# Patient Record
Sex: Female | Born: 1965
Health system: Southern US, Community
[De-identification: ages and names within clinical notes are randomized; demographics above are authoritative.]

## PROBLEM LIST (undated history)

## (undated) DIAGNOSIS — R1013 Epigastric pain: Secondary | ICD-10-CM

## (undated) DIAGNOSIS — K219 Gastro-esophageal reflux disease without esophagitis: Secondary | ICD-10-CM

## (undated) DIAGNOSIS — N83209 Unspecified ovarian cyst, unspecified side: Secondary | ICD-10-CM

## (undated) DIAGNOSIS — K644 Residual hemorrhoidal skin tags: Secondary | ICD-10-CM

## (undated) DIAGNOSIS — R51 Headache: Secondary | ICD-10-CM

## (undated) DIAGNOSIS — R911 Solitary pulmonary nodule: Secondary | ICD-10-CM

## (undated) DIAGNOSIS — R5383 Other fatigue: Secondary | ICD-10-CM

## (undated) DIAGNOSIS — G8929 Other chronic pain: Secondary | ICD-10-CM

## (undated) DIAGNOSIS — Z8719 Personal history of other diseases of the digestive system: Secondary | ICD-10-CM

## (undated) DIAGNOSIS — R5381 Other malaise: Secondary | ICD-10-CM

## (undated) DIAGNOSIS — R209 Unspecified disturbances of skin sensation: Secondary | ICD-10-CM

## (undated) DIAGNOSIS — K8020 Calculus of gallbladder without cholecystitis without obstruction: Secondary | ICD-10-CM

## (undated) DIAGNOSIS — I868 Varicose veins of other specified sites: Secondary | ICD-10-CM

## (undated) DIAGNOSIS — K859 Acute pancreatitis without necrosis or infection, unspecified: Secondary | ICD-10-CM

## (undated) DIAGNOSIS — R945 Abnormal results of liver function studies: Secondary | ICD-10-CM

## (undated) DIAGNOSIS — M545 Low back pain: Secondary | ICD-10-CM

## (undated) DIAGNOSIS — M25559 Pain in unspecified hip: Secondary | ICD-10-CM

## (undated) DIAGNOSIS — R519 Headache, unspecified: Secondary | ICD-10-CM

## (undated) HISTORY — PX: CHOLECYSTECTOMY: SHX55

## (undated) HISTORY — DX: Calculus of gallbladder without cholecystitis without obstruction: K80.20

## (undated) HISTORY — DX: Unspecified ovarian cyst, unspecified side: N83.209

## (undated) HISTORY — DX: Headache: R51

## (undated) HISTORY — DX: Varicose veins of other specified sites: I86.8

## (undated) HISTORY — DX: Other chronic pain: G89.29

## (undated) HISTORY — DX: Other malaise: R53.81

## (undated) HISTORY — DX: Unspecified disturbances of skin sensation: R20.9

## (undated) HISTORY — PX: TONSILLECTOMY: SUR1361

## (undated) HISTORY — DX: Headache, unspecified: R51.9

## (undated) HISTORY — DX: Other fatigue: R53.83

## (undated) HISTORY — DX: Pain in unspecified hip: M25.559

## (undated) HISTORY — DX: Solitary pulmonary nodule: R91.1

## (undated) HISTORY — DX: Residual hemorrhoidal skin tags: K64.4

## (undated) HISTORY — DX: Abnormal results of liver function studies: R94.5

## (undated) HISTORY — PX: TONSILLECTOMY: SHX5217

## (undated) HISTORY — DX: Acute pancreatitis without necrosis or infection, unspecified: K85.90

## (undated) HISTORY — DX: Personal history of other diseases of the digestive system: Z87.19

## (undated) HISTORY — DX: Gastro-esophageal reflux disease without esophagitis: K21.9

## (undated) HISTORY — DX: Epigastric pain: R10.13

## (undated) HISTORY — DX: Low back pain: M54.5

## (undated) HISTORY — PX: OTHER SURGICAL HISTORY: SHX169

---

## 2000-03-28 ENCOUNTER — Other Ambulatory Visit: Admission: RE | Admit: 2000-03-28 | Discharge: 2000-03-28 | Payer: Self-pay | Admitting: Obstetrics & Gynecology

## 2001-10-24 ENCOUNTER — Other Ambulatory Visit: Admission: RE | Admit: 2001-10-24 | Discharge: 2001-10-24 | Payer: Self-pay | Admitting: Obstetrics & Gynecology

## 2001-12-06 ENCOUNTER — Encounter (INDEPENDENT_AMBULATORY_CARE_PROVIDER_SITE_OTHER): Payer: Self-pay | Admitting: *Deleted

## 2002-03-15 ENCOUNTER — Ambulatory Visit (HOSPITAL_COMMUNITY): Admission: RE | Admit: 2002-03-15 | Discharge: 2002-03-15 | Payer: Self-pay | Admitting: Family Medicine

## 2002-03-15 ENCOUNTER — Encounter: Payer: Self-pay | Admitting: Family Medicine

## 2002-10-16 ENCOUNTER — Other Ambulatory Visit: Admission: RE | Admit: 2002-10-16 | Discharge: 2002-10-16 | Payer: Self-pay | Admitting: Gynecology

## 2002-12-19 ENCOUNTER — Encounter (INDEPENDENT_AMBULATORY_CARE_PROVIDER_SITE_OTHER): Payer: Self-pay | Admitting: *Deleted

## 2002-12-19 ENCOUNTER — Encounter: Admission: RE | Admit: 2002-12-19 | Discharge: 2002-12-19 | Payer: Self-pay | Admitting: Family Medicine

## 2002-12-19 ENCOUNTER — Encounter: Payer: Self-pay | Admitting: Family Medicine

## 2004-02-04 ENCOUNTER — Encounter: Admission: RE | Admit: 2004-02-04 | Discharge: 2004-02-04 | Payer: Self-pay | Admitting: Family Medicine

## 2005-01-15 ENCOUNTER — Emergency Department (HOSPITAL_COMMUNITY): Admission: EM | Admit: 2005-01-15 | Discharge: 2005-01-16 | Payer: Self-pay | Admitting: Emergency Medicine

## 2005-01-31 ENCOUNTER — Other Ambulatory Visit: Admission: RE | Admit: 2005-01-31 | Discharge: 2005-01-31 | Payer: Self-pay | Admitting: Obstetrics & Gynecology

## 2005-03-02 ENCOUNTER — Ambulatory Visit: Payer: Self-pay | Admitting: Gastroenterology

## 2005-03-29 ENCOUNTER — Ambulatory Visit: Payer: Self-pay | Admitting: Gastroenterology

## 2005-03-29 ENCOUNTER — Encounter: Payer: Self-pay | Admitting: Internal Medicine

## 2005-07-13 ENCOUNTER — Encounter: Admission: RE | Admit: 2005-07-13 | Discharge: 2005-07-13 | Payer: Self-pay | Admitting: Family Medicine

## 2005-09-13 ENCOUNTER — Ambulatory Visit: Payer: Self-pay | Admitting: Internal Medicine

## 2005-10-19 ENCOUNTER — Ambulatory Visit: Payer: Self-pay | Admitting: Internal Medicine

## 2005-10-24 ENCOUNTER — Ambulatory Visit: Payer: Self-pay | Admitting: Internal Medicine

## 2005-11-11 ENCOUNTER — Ambulatory Visit: Payer: Self-pay | Admitting: Internal Medicine

## 2005-12-23 ENCOUNTER — Ambulatory Visit: Payer: Self-pay | Admitting: Internal Medicine

## 2006-05-22 ENCOUNTER — Ambulatory Visit: Payer: Self-pay | Admitting: Internal Medicine

## 2006-07-22 ENCOUNTER — Ambulatory Visit: Payer: Self-pay | Admitting: Internal Medicine

## 2006-08-09 ENCOUNTER — Encounter: Admission: RE | Admit: 2006-08-09 | Discharge: 2006-08-09 | Payer: Self-pay | Admitting: Internal Medicine

## 2006-08-09 ENCOUNTER — Ambulatory Visit: Payer: Self-pay | Admitting: Internal Medicine

## 2006-09-18 ENCOUNTER — Encounter: Payer: Self-pay | Admitting: Internal Medicine

## 2006-11-07 ENCOUNTER — Encounter: Payer: Self-pay | Admitting: Internal Medicine

## 2006-12-04 ENCOUNTER — Ambulatory Visit: Payer: Self-pay | Admitting: Internal Medicine

## 2006-12-04 LAB — CONVERTED CEMR LAB
ALT: 11 units/L (ref 0–35)
AST: 15 units/L (ref 0–37)
Albumin: 3.9 g/dL (ref 3.5–5.2)
Alkaline Phosphatase: 42 units/L (ref 39–117)
Amylase: 72 units/L (ref 27–131)
Basophils Absolute: 0.1 10*3/uL (ref 0.0–0.1)
Basophils Relative: 0.8 % (ref 0.0–1.0)
Bilirubin, Direct: 0.1 mg/dL (ref 0.0–0.3)
Eosinophils Absolute: 0.2 10*3/uL (ref 0.0–0.6)
Eosinophils Relative: 2.6 % (ref 0.0–5.0)
HCT: 41.6 % (ref 36.0–46.0)
Hemoglobin: 14.4 g/dL (ref 12.0–15.0)
Lipase: 26 units/L (ref 11.0–59.0)
Lymphocytes Relative: 27.3 % (ref 12.0–46.0)
MCHC: 34.6 g/dL (ref 30.0–36.0)
MCV: 93.7 fL (ref 78.0–100.0)
Monocytes Absolute: 0.5 10*3/uL (ref 0.2–0.7)
Monocytes Relative: 7.2 % (ref 3.0–11.0)
Neutro Abs: 3.9 10*3/uL (ref 1.4–7.7)
Neutrophils Relative %: 62.1 % (ref 43.0–77.0)
Platelets: 280 10*3/uL (ref 150–400)
RBC: 4.44 M/uL (ref 3.87–5.11)
RDW: 12 % (ref 11.5–14.6)
Total Bilirubin: 1 mg/dL (ref 0.3–1.2)
Total Protein: 7.5 g/dL (ref 6.0–8.3)
WBC: 6.5 10*3/uL (ref 4.5–10.5)

## 2006-12-05 ENCOUNTER — Encounter: Admission: RE | Admit: 2006-12-05 | Discharge: 2006-12-05 | Payer: Self-pay | Admitting: Internal Medicine

## 2006-12-05 ENCOUNTER — Encounter: Payer: Self-pay | Admitting: Internal Medicine

## 2007-03-12 ENCOUNTER — Telehealth: Payer: Self-pay | Admitting: Internal Medicine

## 2007-03-13 ENCOUNTER — Ambulatory Visit: Payer: Self-pay | Admitting: Internal Medicine

## 2007-03-13 DIAGNOSIS — R519 Headache, unspecified: Secondary | ICD-10-CM | POA: Insufficient documentation

## 2007-03-13 DIAGNOSIS — K219 Gastro-esophageal reflux disease without esophagitis: Secondary | ICD-10-CM

## 2007-03-13 DIAGNOSIS — R51 Headache: Secondary | ICD-10-CM | POA: Insufficient documentation

## 2007-03-13 DIAGNOSIS — R209 Unspecified disturbances of skin sensation: Secondary | ICD-10-CM | POA: Insufficient documentation

## 2007-03-13 HISTORY — DX: Unspecified disturbances of skin sensation: R20.9

## 2007-03-13 HISTORY — DX: Gastro-esophageal reflux disease without esophagitis: K21.9

## 2007-03-13 HISTORY — DX: Headache: R51

## 2007-04-10 ENCOUNTER — Telehealth: Payer: Self-pay | Admitting: Internal Medicine

## 2007-04-13 ENCOUNTER — Ambulatory Visit: Payer: Self-pay | Admitting: Internal Medicine

## 2007-04-13 DIAGNOSIS — R5383 Other fatigue: Secondary | ICD-10-CM

## 2007-04-13 DIAGNOSIS — R5381 Other malaise: Secondary | ICD-10-CM | POA: Insufficient documentation

## 2007-04-13 DIAGNOSIS — R350 Frequency of micturition: Secondary | ICD-10-CM | POA: Insufficient documentation

## 2007-04-13 HISTORY — DX: Other malaise: R53.81

## 2007-04-13 LAB — CONVERTED CEMR LAB
Bilirubin Urine: NEGATIVE
Blood in Urine, dipstick: NEGATIVE
CMV IgM: <0.9 (ref <0.90)
Glucose, Urine, Semiquant: NEGATIVE
HCV Ab: NEGATIVE
Ketones, urine, test strip: NEGATIVE
Nitrite: NEGATIVE
Protein, U semiquant: NEGATIVE
Specific Gravity, Urine: 1.02
Urobilinogen, UA: 0.2
WBC Urine, dipstick: NEGATIVE
pH: 7

## 2007-04-14 ENCOUNTER — Encounter: Payer: Self-pay | Admitting: Internal Medicine

## 2007-04-18 LAB — CONVERTED CEMR LAB
ALT: 15 units/L (ref 0–35)
AST: 21 units/L (ref 0–37)
Albumin: 4.1 g/dL (ref 3.5–5.2)
Alkaline Phosphatase: 45 units/L (ref 39–117)
BUN: 17 mg/dL (ref 6–23)
Basophils Absolute: 0 10*3/uL (ref 0.0–0.1)
Basophils Relative: 0.2 % (ref 0.0–1.0)
Bilirubin, Direct: 0.1 mg/dL (ref 0.0–0.3)
CO2: 27 meq/L (ref 19–32)
CRP, High Sensitivity: <1 — ABNORMAL LOW (ref 0.00–5.00)
Calcium: 9.2 mg/dL (ref 8.4–10.5)
Chloride: 103 meq/L (ref 96–112)
Creatinine, Ser: 0.7 mg/dL (ref 0.4–1.2)
Eosinophils Absolute: 0.2 10*3/uL (ref 0.0–0.6)
Eosinophils Relative: 2.4 % (ref 0.0–5.0)
Ferritin: 38.8 ng/mL (ref 10.0–291.0)
Free T4: 0.8 ng/dL (ref 0.6–1.6)
GFR calc Af Amer: 119 mL/min
GFR calc non Af Amer: 99 mL/min
Glucose, Bld: 83 mg/dL (ref 70–99)
HCT: 41.1 % (ref 36.0–46.0)
Hemoglobin: 13.9 g/dL (ref 12.0–15.0)
Lymphocytes Relative: 32.2 % (ref 12.0–46.0)
MCHC: 33.9 g/dL (ref 30.0–36.0)
MCV: 95.8 fL (ref 78.0–100.0)
Monocytes Absolute: 0.5 10*3/uL (ref 0.2–0.7)
Monocytes Relative: 8.3 % (ref 3.0–11.0)
Neutro Abs: 3.6 10*3/uL (ref 1.4–7.7)
Neutrophils Relative %: 56.9 % (ref 43.0–77.0)
Platelets: 253 10*3/uL (ref 150–400)
Potassium: 4.1 meq/L (ref 3.5–5.1)
RBC: 4.29 M/uL (ref 3.87–5.11)
RDW: 12.4 % (ref 11.5–14.6)
Sodium: 137 meq/L (ref 135–145)
T3, Free: 3.1 pg/mL (ref 2.3–4.2)
TSH: 1.88 microintl units/mL (ref 0.35–5.50)
Total Bilirubin: 0.9 mg/dL (ref 0.3–1.2)
Total Protein: 7.1 g/dL (ref 6.0–8.3)
WBC: 6.3 10*3/uL (ref 4.5–10.5)

## 2007-05-08 ENCOUNTER — Encounter: Payer: Self-pay | Admitting: Internal Medicine

## 2007-06-20 ENCOUNTER — Ambulatory Visit: Payer: Self-pay | Admitting: Internal Medicine

## 2007-06-20 DIAGNOSIS — H9209 Otalgia, unspecified ear: Secondary | ICD-10-CM | POA: Insufficient documentation

## 2007-06-20 DIAGNOSIS — J029 Acute pharyngitis, unspecified: Secondary | ICD-10-CM | POA: Insufficient documentation

## 2007-06-20 LAB — CONVERTED CEMR LAB: Rapid Strep: NEGATIVE

## 2007-06-25 ENCOUNTER — Ambulatory Visit: Payer: Self-pay | Admitting: Internal Medicine

## 2007-06-25 DIAGNOSIS — J069 Acute upper respiratory infection, unspecified: Secondary | ICD-10-CM | POA: Insufficient documentation

## 2007-07-04 ENCOUNTER — Telehealth: Payer: Self-pay | Admitting: Internal Medicine

## 2007-07-24 ENCOUNTER — Ambulatory Visit: Payer: Self-pay | Admitting: Internal Medicine

## 2007-07-27 ENCOUNTER — Telehealth: Payer: Self-pay | Admitting: Internal Medicine

## 2007-08-01 ENCOUNTER — Telehealth: Payer: Self-pay | Admitting: Internal Medicine

## 2007-08-02 ENCOUNTER — Ambulatory Visit: Payer: Self-pay | Admitting: Family Medicine

## 2007-08-05 ENCOUNTER — Emergency Department (HOSPITAL_COMMUNITY): Admission: EM | Admit: 2007-08-05 | Discharge: 2007-08-05 | Payer: Self-pay | Admitting: Emergency Medicine

## 2008-01-11 ENCOUNTER — Ambulatory Visit: Payer: Self-pay | Admitting: Internal Medicine

## 2008-02-13 ENCOUNTER — Ambulatory Visit: Payer: Self-pay | Admitting: Internal Medicine

## 2008-06-03 ENCOUNTER — Ambulatory Visit: Payer: Self-pay | Admitting: Internal Medicine

## 2008-06-03 DIAGNOSIS — R05 Cough: Secondary | ICD-10-CM

## 2008-06-03 DIAGNOSIS — R053 Chronic cough: Secondary | ICD-10-CM | POA: Insufficient documentation

## 2008-08-06 ENCOUNTER — Ambulatory Visit: Payer: Self-pay | Admitting: Internal Medicine

## 2008-08-06 DIAGNOSIS — M545 Low back pain, unspecified: Secondary | ICD-10-CM | POA: Insufficient documentation

## 2008-08-06 DIAGNOSIS — R142 Eructation: Secondary | ICD-10-CM

## 2008-08-06 DIAGNOSIS — R198 Other specified symptoms and signs involving the digestive system and abdomen: Secondary | ICD-10-CM | POA: Insufficient documentation

## 2008-08-06 DIAGNOSIS — R143 Flatulence: Secondary | ICD-10-CM

## 2008-08-06 DIAGNOSIS — R141 Gas pain: Secondary | ICD-10-CM | POA: Insufficient documentation

## 2008-08-06 HISTORY — DX: Low back pain, unspecified: M54.50

## 2008-08-06 LAB — CONVERTED CEMR LAB
Bilirubin Urine: NEGATIVE
Blood in Urine, dipstick: NEGATIVE
Glucose, Urine, Semiquant: NEGATIVE
Ketones, urine, test strip: NEGATIVE
Nitrite: NEGATIVE
Specific Gravity, Urine: 1.025
Urobilinogen, UA: 0.2
WBC Urine, dipstick: NEGATIVE
pH: 6.5

## 2008-08-11 ENCOUNTER — Ambulatory Visit: Payer: Self-pay | Admitting: Internal Medicine

## 2008-08-20 ENCOUNTER — Ambulatory Visit: Payer: Self-pay | Admitting: Internal Medicine

## 2008-08-20 LAB — HM COLONOSCOPY

## 2008-10-01 ENCOUNTER — Ambulatory Visit: Payer: Self-pay | Admitting: Internal Medicine

## 2009-02-23 ENCOUNTER — Ambulatory Visit: Payer: Self-pay | Admitting: Internal Medicine

## 2009-02-23 DIAGNOSIS — M25559 Pain in unspecified hip: Secondary | ICD-10-CM

## 2009-02-23 DIAGNOSIS — M549 Dorsalgia, unspecified: Secondary | ICD-10-CM | POA: Insufficient documentation

## 2009-02-23 HISTORY — DX: Pain in unspecified hip: M25.559

## 2009-02-23 LAB — CONVERTED CEMR LAB
Bilirubin Urine: NEGATIVE
Glucose, Urine, Semiquant: NEGATIVE
Ketones, urine, test strip: NEGATIVE
Nitrite: NEGATIVE
Protein, U semiquant: NEGATIVE
Specific Gravity, Urine: 1.02
Urobilinogen, UA: 0.2
pH: 5

## 2009-02-26 ENCOUNTER — Encounter: Admission: RE | Admit: 2009-02-26 | Discharge: 2009-02-26 | Payer: Self-pay | Admitting: Internal Medicine

## 2009-03-04 ENCOUNTER — Telehealth: Payer: Self-pay | Admitting: *Deleted

## 2009-03-25 ENCOUNTER — Encounter: Payer: Self-pay | Admitting: Internal Medicine

## 2009-03-26 ENCOUNTER — Encounter: Payer: Self-pay | Admitting: *Deleted

## 2009-10-01 ENCOUNTER — Encounter: Payer: Self-pay | Admitting: Internal Medicine

## 2009-10-07 ENCOUNTER — Telehealth: Payer: Self-pay | Admitting: *Deleted

## 2009-10-14 ENCOUNTER — Ambulatory Visit: Payer: Self-pay | Admitting: Internal Medicine

## 2009-10-14 DIAGNOSIS — R197 Diarrhea, unspecified: Secondary | ICD-10-CM | POA: Insufficient documentation

## 2009-10-14 LAB — CONVERTED CEMR LAB
Bilirubin Urine: NEGATIVE
Blood in Urine, dipstick: NEGATIVE
Glucose, Urine, Semiquant: NEGATIVE
Ketones, urine, test strip: NEGATIVE
Nitrite: NEGATIVE
Protein, U semiquant: NEGATIVE
Specific Gravity, Urine: 1.025
Urobilinogen, UA: 0.2
WBC Urine, dipstick: NEGATIVE
pH: 7

## 2009-10-17 ENCOUNTER — Encounter: Payer: Self-pay | Admitting: Internal Medicine

## 2009-12-18 ENCOUNTER — Telehealth: Payer: Self-pay | Admitting: *Deleted

## 2009-12-18 ENCOUNTER — Emergency Department (HOSPITAL_COMMUNITY): Admission: EM | Admit: 2009-12-18 | Discharge: 2009-12-18 | Payer: Self-pay | Admitting: Emergency Medicine

## 2009-12-18 DIAGNOSIS — K859 Acute pancreatitis without necrosis or infection, unspecified: Secondary | ICD-10-CM

## 2009-12-18 HISTORY — DX: Acute pancreatitis without necrosis or infection, unspecified: K85.90

## 2009-12-21 ENCOUNTER — Encounter: Payer: Self-pay | Admitting: Internal Medicine

## 2009-12-21 ENCOUNTER — Ambulatory Visit: Payer: Self-pay | Admitting: Internal Medicine

## 2009-12-21 LAB — CONVERTED CEMR LAB: Lipase: 35 units/L (ref 0–75)

## 2009-12-22 LAB — CONVERTED CEMR LAB
ALT: 52 units/L — ABNORMAL HIGH (ref 0–35)
AST: 24 units/L (ref 0–37)
Albumin: 3.9 g/dL (ref 3.5–5.2)
Alkaline Phosphatase: 44 units/L (ref 39–117)
Amylase: 52 units/L (ref 27–131)
Bilirubin, Direct: 0.1 mg/dL (ref 0.0–0.3)
Total Bilirubin: 0.6 mg/dL (ref 0.3–1.2)
Total Protein: 6.9 g/dL (ref 6.0–8.3)

## 2010-01-04 ENCOUNTER — Telehealth: Payer: Self-pay | Admitting: *Deleted

## 2010-01-06 ENCOUNTER — Ambulatory Visit: Payer: Self-pay | Admitting: Internal Medicine

## 2010-01-06 DIAGNOSIS — R1013 Epigastric pain: Secondary | ICD-10-CM

## 2010-01-06 DIAGNOSIS — I868 Varicose veins of other specified sites: Secondary | ICD-10-CM | POA: Insufficient documentation

## 2010-01-06 DIAGNOSIS — R945 Abnormal results of liver function studies: Secondary | ICD-10-CM

## 2010-01-06 DIAGNOSIS — R252 Cramp and spasm: Secondary | ICD-10-CM | POA: Insufficient documentation

## 2010-01-06 HISTORY — DX: Varicose veins of other specified sites: I86.8

## 2010-01-06 HISTORY — DX: Abnormal results of liver function studies: R94.5

## 2010-01-06 HISTORY — DX: Epigastric pain: R10.13

## 2010-01-06 LAB — CONVERTED CEMR LAB
ALT: 13 units/L (ref 0–35)
ANA Titer 1: 1:80 {titer} — ABNORMAL HIGH
AST: 18 units/L (ref 0–37)
Albumin: 4 g/dL (ref 3.5–5.2)
Alkaline Phosphatase: 42 units/L (ref 39–117)
Amylase: 48 units/L (ref 27–131)
Anti Nuclear Antibody(ANA): POSITIVE — AB
BUN: 14 mg/dL (ref 6–23)
Bilirubin, Direct: 0.1 mg/dL (ref 0.0–0.3)
CO2: 27 meq/L (ref 19–32)
CRP, High Sensitivity: 1.15 (ref 0.00–5.00)
Calcium: 9.1 mg/dL (ref 8.4–10.5)
Chloride: 105 meq/L (ref 96–112)
Creatinine, Ser: 0.7 mg/dL (ref 0.4–1.2)
Folate: 14.8 ng/mL
GFR calc non Af Amer: 103.82 mL/min (ref >60)
Glucose, Bld: 89 mg/dL (ref 70–99)
IgA: 313 mg/dL (ref 68–378)
IgA: 285 mg/dL (ref 68–378)
Magnesium: 1.8 mg/dL (ref 1.5–2.5)
Potassium: 4.2 meq/L (ref 3.5–5.1)
Sodium: 141 meq/L (ref 135–145)
Tissue Transglutaminase Ab, IgA: 15.4 units (ref <20)
Total Bilirubin: 0.5 mg/dL (ref 0.3–1.2)
Total Protein: 6.7 g/dL (ref 6.0–8.3)
Vitamin B-12: 241 pg/mL (ref 211–911)

## 2010-01-19 ENCOUNTER — Encounter: Payer: Self-pay | Admitting: Internal Medicine

## 2010-02-18 DIAGNOSIS — K644 Residual hemorrhoidal skin tags: Secondary | ICD-10-CM

## 2010-02-18 DIAGNOSIS — K859 Acute pancreatitis without necrosis or infection, unspecified: Secondary | ICD-10-CM

## 2010-02-18 DIAGNOSIS — Z8719 Personal history of other diseases of the digestive system: Secondary | ICD-10-CM | POA: Insufficient documentation

## 2010-02-18 HISTORY — DX: Personal history of other diseases of the digestive system: Z87.19

## 2010-02-18 HISTORY — DX: Acute pancreatitis without necrosis or infection, unspecified: K85.90

## 2010-02-18 HISTORY — DX: Residual hemorrhoidal skin tags: K64.4

## 2010-02-24 ENCOUNTER — Ambulatory Visit: Payer: Self-pay | Admitting: Gastroenterology

## 2010-03-04 ENCOUNTER — Ambulatory Visit (HOSPITAL_COMMUNITY): Admission: RE | Admit: 2010-03-04 | Discharge: 2010-03-04 | Payer: Self-pay | Admitting: Gastroenterology

## 2010-03-08 ENCOUNTER — Telehealth: Payer: Self-pay | Admitting: Physician Assistant

## 2010-06-18 ENCOUNTER — Ambulatory Visit
Admission: RE | Admit: 2010-06-18 | Discharge: 2010-06-18 | Payer: Self-pay | Source: Home / Self Care | Attending: Internal Medicine | Admitting: Internal Medicine

## 2010-06-24 NOTE — Progress Notes (Signed)
Summary: not feeling well  Phone Note Call from Patient Call back at Home Phone [phone]   Caller: patient (triage message) Call For: [name] Summary of Call: dr Fabian Sharp wanted call back about her back  it is still tingling but is some better extremely fatigued  not feeling well  cell [phone] Initial call taken by: Roselle Locus,  April 10, 2007 1:56 PM  Follow-up for Phone Call        Extremem fatigue is new symptom.  Back tingling some better.  Has had to use advil 7-8 doses last couple weeks for headaches.  Last night had 30 min episode of eye quivering, then felt fuzzy mentally.  Then extrememly fatigued this am & all day.  Just not normal.  Should she make another ov for Fri? Follow-up by: Rudy Jew, RN,  April 10, 2007 2:21 PM  Additional Follow-up for Phone Call Additional follow up Details #1::        Per md ok to make an appointment for Friday. Additional Follow-up by: Romualdo Bolk, CMA,  April 10, 2007 5:01 PM    Additional Follow-up for Phone Call Additional follow up Details #2::    Patient advised - left message. Follow-up by: Rudy Jew, RN,  April 10, 2007 5:18 PM

## 2010-06-24 NOTE — Assessment & Plan Note (Signed)
Summary: EXTREME FATIGUE/OK PER DR P/PS   Vital Signs:  Patient Profile:   45 Years Old Female Weight:      159 pounds Pulse rate:   66 / minute BP sitting:   100 / 70  (left arm) Cuff size:   regular  Vitals Entered By: Romualdo Bolk, CMA (April 13, 2007 10:50 AM)                 Chief Complaint:  fatigue x 2 weeks, increase urinatation 2 weeks ago, and better today but 2 days ago took a nose dive.  History of Present Illness: Debra [name] comes in today for above...  thinking is cloudy at times   Numbness feeling better on r trunk.(  see  last ov)  no  changes  ? left vision quivering episode and then HA x 1 Usually doesn't have this with her regular migraines. No weakness sweats weight loss , cp or SOB . No unusual rashes or arthritis symptom or joint swellings. has had some intermittent urinary frequency without dysuria. Period ok and on time.  No sleep apnea symptom s   .  Fatigue issues have been there for about 3 weeks.   doesn't feel like its stress.  No fever  Had mono when younger. Had e nodosum when younger x 1 realted ? to infection.   Current Allergies (reviewed today): ! PENICILLIN ! SULFA  Past Medical History:    Reviewed history from 03/13/2007 and no changes required:       GERD       Headache migraines       hx e dodosum   / strep and mono       staph infection right knee  1978   Family History:    father died of cardiomyopathy 2023/04/21    Family History of Alcoholism/Addiction fa    Family History Diabetes 1st degree relative mom    Family History Lung cancer mom    Family History of Arthritis    fadied chf? 10/07    Review of Systems       see hpi    Physical Exam  General:     Well-developed,well-nourished,in no acute distress; alert,appropriate and cooperative throughout examination Head:     Normocephalic and atraumatic without obvious abnormalities. No apparent alopecia or balding. Eyes:     vision grossly intact, pupils  equal, and pupils round.   Ears:     R ear normal and L ear normal.   Nose:     no nasal discharge.   Mouth:     good dentition, no gingival abnormalities, and pharynx pink and moist.   Neck:     No deformities, masses, or tenderness noted. Lungs:     Normal respiratory effort, chest expands symmetrically. Lungs are clear to auscultation, no crackles or wheezes. Heart:     Normal rate and regular rhythm. S1 and S2 normal without gallop, murmur, click, rub or other extra sounds. Abdomen:     Bowel sounds positive,abdomen soft and non-tender without masses, organomegaly or hernias noted. Msk:     No deformity or scoliosis noted of thoracic or lumbar spine.   Pulses:     R and L carotid,radial,femoral,dorsalis pedis and posterior tibial pulses are full and equal bilaterally Extremities:     no edema  Neurologic:     alert & oriented X3, strength normal in all extremities, gait normal, and DTRs symmetrical and normal.  good balance Skin:  Intact without suspicious lesions or rashes Cervical Nodes:     No lymphadenopathy noted Axillary Nodes:     No palpable lymphadenopathy Psych:     memory intact for recent and remote, normally interactive, and good eye contact.      Impression & Recommendations:  Problem # 1:  FATIGUE (ICD-780.79) Assessment: New acts like a mono like illness with normal PE  .  will  do some baseline labs and follow up if nor improving . TO call if any new symptom  or progression of symptom  Orders: Venipuncture (04540) TLB-BMP (Basic Metabolic Panel-BMET) (80048-METABOL) TLB-Hepatic/Liver Function Pnl (80076-HEPATIC) TLB-CBC Platelet - w/Differential (85025-CBCD) TLB-CRP-Full Range (C-Reactive Protein) (86140-FCRP) TLB-Ferritin (82728-FER) TLB-T3, Free (Triiodothyronine) (84481-T3FREE) TLB-T4 (Thyrox), Free [phone]) TLB-TSH (Thyroid Stimulating Hormone) (84443-TSH) T-HIV Antibody  (Reflex) ([id number]) T- * Misc. Laboratory test  [phone])   Problem # 2:  URINARY FREQUENCY (ICD-788.41) ? cuase Orders: UA Dipstick w/o Micro (81002)   Problem # 3:  HEADACHE (ICD-784.0) hx of same Her updated medication list for this problem includes:    Imitrex 100 Mg Tabs (Sumatriptan succinate)    Advil 200 Mg Caps (Ibuprofen)   Problem # 4:  PARESTHESIA (ICD-782.0) Assessment: Improved resolved from last visit   Complete Medication List: 1)  Prevacid Solutab 30 Mg Tbdp (Lansoprazole) .Marland Kitchen.. 1 two times a day as needed 2)  Imitrex 100 Mg Tabs (Sumatriptan succinate) 3)  Advil 200 Mg Caps (Ibuprofen)     ] Laboratory Results   Urine Tests   Date/Time Reported: April 13, 2007 11:04 AM   Routine Urinalysis   Color: yellow Appearance: Clear Glucose: negative   (Normal Range: Negative) Bilirubin: negative   (Normal Range: Negative) Ketone: negative   (Normal Range: Negative) Spec. Gravity: 1.020   (Normal Range: 1.003-1.035) Blood: negative   (Normal Range: Negative) pH: 7.0   (Normal Range: 5.0-8.0) Protein: negative   (Normal Range: Negative) Urobilinogen: 0.2   (Normal Range: 0-1) Nitrite: negative   (Normal Range: Negative) Leukocyte Esterace: negative   (Normal Range: Negative)    Comments: ..................................................................Marland KitchenWynona Canes, CMA  April 13, 2007 11:04 AM

## 2010-06-24 NOTE — Assessment & Plan Note (Signed)
Summary: NAUSEA-NO FEVER//CCM   Vital Signs:  Patient profile:   45 year old female Menstrual status:  regular LMP:     07/23/2008 Height:      67 inches Weight:      161 pounds BMI:     25.31 Temp:     98.4 degrees F oral Pulse rate:   78 / minute BP sitting:   100 / 70  (left arm) Cuff size:   regular  Vitals Entered By: Romualdo Bolk, CMA (August 06, 2008 2:24 PM)  CC: nausea, bloating, cramping, lower back pain, ha's, about 1 week ago had a black stool but none since then. LMP (date): 07/23/2008 LMP - Character: normal Menarche (age onset): 13 years  Menses interval: 26 days  Menstrual flow (days) 6 Menstrual Status regular Enter LMP: 07/23/2008   History of Present Illness: Debra [name] comes in for above signs and has some skipped stools and then nor normal shapes .  last week had 1 black stool.   Over weekend had pp nausea and lbp.     did take prepto x 1 ( black stool 2 days later.)  Last week had has after period.  then got really dizzy . taking advil   73m-15 200 mg over 2 weeks.   for HAs  and ocass prevacid ( as needed).  Had gyne check , normal   was ok and bloating not felt to be related.   Due for colonoscopy for fam hx of coloncancer .   Used to see dr Corinda Gubler  .   Preventive Screening-Counseling & Management     Alcohol drinks/day: 0     Smoking Status: never     Caffeine use/day: 3     Does Patient Exercise: yes     Type of exercise: spin classes, wt,walk     Exercise (avg: min/session): >60     Times/week: 3  Current Medications (verified): 1)  Prevacid Solutab 30 Mg  Tbdp (Lansoprazole) .Marland Kitchen.. 1 Two Times A Day As Needed 2)  Imitrex 100 Mg  Tabs (Sumatriptan Succinate) 3)  Advil 200 Mg  Caps (Ibuprofen) 4)  Flintstones Complete   Chew (Pediatric Multivit-Minerals-C)  Allergies (verified): 1)  ! Penicillin 2)  ! Sulfa  Past History:  Past Medical History:    GERD    Headache migraines    hx e dodosum   / strep and mono    staph  infection right knee  1978    Dislocated Patella    Consults    Dr. Farris Has- Ortho    Dr. Gray Bernhardt    Dr. Tomasita Morrow md  (06/03/2008)  Past Surgical History:    Cholecystectomy    Tonsillectomy     (03/13/2007)  Family History:    father died of cardiomyopathy April 08, 2023    Family History of Alcoholism/Addiction fa    Family History Diabetes 1st degree relative mom    Family History Lung cancer mom    Family History of Arthritis    fadied chf? 10/07 (04/13/2007)  Social History:    Occupation:pharmacist    Never Smoked    Married    dogs in hh      (06/03/2008)  Past History:  Care Management:    OB/Gyn: Dr. Jennette Kettle    Gastroenterology: Dr. Corinda Gubler  Social History:    Caffeine use/day:  3    Does Patient Exercise:  yes  Review of Systems  The patient denies anorexia, fever, weight loss, dyspnea on exertion, peripheral edema, prolonged  cough, hemoptysis, melena, hematochezia, severe indigestion/heartburn, transient blindness, difficulty walking, unusual weight change, abnormal bleeding, enlarged lymph nodes, and angioedema.    Physical Exam  General:  Well-developed,well-nourished,in no acute distress; alert,appropriate and cooperative throughout examination Head:  normocephalic and atraumatic.   Eyes:  vision grossly intact, pupils equal, and pupils round.   Ears:  no external deformities.   Neck:  No deformities, masses, or tenderness noted. Lungs:  Normal respiratory effort, chest expands symmetrically. Lungs are clear to auscultation, no crackles or wheezes.no dullness.   Heart:  Normal rate and regular rhythm. S1 and S2 normal without gallop, murmur, click, rub or other extra sounds.no lifts.   Abdomen:  Bowel sounds positive,abdomen soft and non-tender without masses, organomegaly or noted. Pulses:  intact   no brutis  Extremities:  no clubbing cyanosis or edema  Neurologic:  non focal Skin:  turgor normal and color normal.  midline hair no change  Cervical Nodes:   No lymphadenopathy noted Psych:  Normal eye contact, appropriate affect. Cognition appears normal.    Impression & Recommendations:  Problem # 1:  OTHER SYMPTOMS INVOLVING DIGESTIVE SYSTEM OTHER (ICD-787.99)  change in bowel habits   ? if IBs or other    suspect black stool from pepto.  also could have gastritis from nsaid although not a daily occurrance .  r/o celiac etc.   Orders: Gastroenterology Referral (GI)  Problem # 2:  ABDOMINAL BLOATING (ICD-787.3)  see above   Orders: Gastroenterology Referral (GI)  Problem # 3:  HEADACHE (ICD-784.0) needs tracking to help with control.   Her updated medication list for this problem includes:    Imitrex 100 Mg Tabs (Sumatriptan succinate)    Advil 200 Mg Caps (Ibuprofen)  Problem # 4:  fam hx of colon cancer  ? due for colonoscopy?  Complete Medication List: 1)  Prevacid Solutab 30 Mg Tbdp (Lansoprazole) .Marland Kitchen.. 1 two times a day as needed 2)  Imitrex 100 Mg Tabs (Sumatriptan succinate) 3)  Advil 200 Mg Caps (Ibuprofen) 4)  Flintstones Complete Chew (Pediatric multivit-minerals-c)  Other Orders: UA Dipstick w/o Micro (automated)  (81003)  Patient Instructions: 1)  Do HA calendar  2)  Minimize ibuprofen. 3)  Take the prevacid every day 4)  Will do referral to Dr Juanda Chance  5)  call in the meantime if worse ning  6)  Stool cards 7)  no Pepto  8)  The medication list was reviewed and reconciled.  All changed / newly prescribed medications were explained.  A complete medication list was provided to the patient / caregiver. 9)  ROV  in 2 months with HA calendar.       Preventive Care Screening  Colonoscopy:    Date:  05/23/2000    Results:  normal   Laboratory Results   Urine Tests    Routine Urinalysis   Color: yellow Appearance: Clear Glucose: negative   (Normal Range: Negative) Bilirubin: negative   (Normal Range: Negative) Ketone: negative   (Normal Range: Negative) Spec. Gravity: 1.025   (Normal Range:  1.003-1.035) Blood: negative   (Normal Range: Negative) pH: 6.5   (Normal Range: 5.0-8.0) Protein: trace   (Normal Range: Negative) Urobilinogen: 0.2   (Normal Range: 0-1) Nitrite: negative   (Normal Range: Negative) Leukocyte Esterace: negative   (Normal Range: Negative)    Comments: Rita Ohara  August 06, 2008 2:44 PM

## 2010-06-24 NOTE — Assessment & Plan Note (Signed)
Summary: ARRIVING AT 9:15am....MILD PANCREATITIS/YF   History of Present Illness Visit Type: Initial Consult Primary GI MD: Lina Sar MD Primary Provider: Berniece Andreas, MD Requesting Provider: Berniece Andreas, MD Chief Complaint: Patient here for f/u after pancreatitis flare. She states that she is well now but does have flares of RUQ abdominal pain as well as GERD symptoms, bloating and nausea. History of Present Illness:   PLEASANT 45 YO FEMALE KNOWN TO DR. Juanda Chance. SHE HAS HAD COLONOSCOPIES DUE TO FAMILY HX. LAST COLON 3/10  WAS NORMAL. SHE IS REFERRED TODAY BECAUSE OF A RECENT EPISODE OF MILD PANCREATITIS. SHE HAD AN ER VISIT 12/18/09 WITH ACUTE ABDOMINAL PAIN, AND WAS FOUND ON LABS TO HAVE AN ELEVATED LIPASE AT 243.SHE DID NOT HAVE ANY IMAGING. SHE SAYS HER PAIN WAS NOT SEVERE AND RESOLVED WITHIN A COUPLE DAYS. SHE HAS NOT HAD ANY RECURRENCE. SHE IS S/P CHOLECYSTECTOMY REMOTELY.  INTERESTINGLY SHE HAS ALSO BEEN HAVING A WORKUP FOR PARASTHESIAS,AND HAS A +ANA HOMOGENEOUS,1:80, ALSO HAS A + ANTIGLIADIN AB IGG. SHE HAD A RHEUM CONSULT, WAS FEELING GOOD AT THAT TIME AND NO FURTHER WORKUP DONE, DID NOT FEEL SHE HAD SXS C/W S.L.E.   GI Review of Systems    Reports abdominal pain, acid reflux, bloating, chest pain, and  nausea.     Location of  Abdominal pain: RUQ.    Denies belching, dysphagia with liquids, dysphagia with solids, heartburn, loss of appetite, vomiting, vomiting blood, weight loss, and  weight gain.      Reports change in bowel habits.     Denies anal fissure, black tarry stools, constipation, diarrhea, diverticulosis, fecal incontinence, heme positive stool, hemorrhoids, irritable bowel syndrome, jaundice, light color stool, liver problems, rectal bleeding, and  rectal pain. Preventive Screening-Counseling & Management  Caffeine-Diet-Exercise     Does Patient Exercise: yes    Current Medications (verified): 1)  Prevacid Solutab 30 Mg  Tbdp (Lansoprazole) .Marland Kitchen.. 1 Two Times A  Day As Needed 2)  Imitrex 100 Mg  Tabs (Sumatriptan Succinate) .... Take 1 Tab As Needed For Headache, May Repeat in 2 Hours If Needed 3)  Advil 200 Mg  Caps (Ibuprofen) .... Take 3 Capsules By Mouth As Needed 4)  Flintstones Complete   Chew (Pediatric Multivit-Minerals-C) .... Take 1 Tablet By Mouth Once A Day  Allergies (verified): 1)  Penicillin 2)  Sulfa  Past History:  Past Medical History: GERD Headache migraines hx e dodosum   / strep and mono staph infection right knee  1978 Dislocated Patella back pain   hx of mri of back 2010   Consults Dr. Farris Has- Ortho Dr. Gray Bernhardt Dr. Tomasita Morrow md   Past Surgical History: Cholecystectomy Tonsillectomy COLONOSCOPY 3/10 NORMAL DR. Juanda Chance  Family History: Reviewed history from 02/18/2010 and no changes required. father died of cardiomyopathy 03/31/23 Family History of Alcoholism/Addiction fa Family History Diabetes 1st degree relative mom Family History Lung cancer mom Family History of Arthritis fadied chf? 10/07 Maternal GF pancereatitis   not alcohol. Family History of Colon Cancer: Maternal Grandmother  Social History: Occupation:Pharmacist  Never Smoked Married dogs in hh  Alcohol Use - yes-2 glasses per month Illicit Drug Use - no Patient gets regular exercise.  Review of Systems       The patient complains of back pain, cough, headaches-new, and muscle pains/cramps.  The patient denies allergy/sinus, anemia, anxiety-new, arthritis/joint pain, blood in urine, breast changes/lumps, change in vision, confusion, coughing up blood, depression-new, fainting, fatigue, fever, hearing problems, heart murmur, heart rhythm changes, itching, menstrual  pain, night sweats, nosebleeds, pregnancy symptoms, shortness of breath, skin rash, sleeping problems, sore throat, swelling of feet/legs, swollen lymph glands, thirst - excessive , urination - excessive , urination changes/pain, urine leakage, vision changes, and voice change.          SEE HPI  Vital Signs:  Patient profile:   45 year old female Menstrual status:  regular Height:      67 inches Weight:      161.38 pounds Pulse rate:   76 / minute Pulse rhythm:   regular BP sitting:   106 / 68  (left arm)  Vitals Entered By: Lamona Curl CMA Duncan Dull) (February 24, 2010 9:43 AM)  Physical Exam  General:  Well developed, well nourished, no acute distress. Head:  Normocephalic and atraumatic. Eyes:  PERRLA, no icterus. Lungs:  Clear throughout to auscultation. Heart:  Regular rate and rhythm; no murmurs, rubs,  or bruits. Abdomen:  SOFT, NONTENDER, NO MASS OR HSM,BS+ Rectal:  NOT DONE Extremities:  No clubbing, cyanosis, edema or deformities noted. Neurologic:  Alert and  oriented x4;  grossly normal neurologically. Psych:  Alert and cooperative. Normal mood and affect.   Impression & Recommendations:  Problem # 1:  PANCREATITIS, ACUTE (ICD-577.0) Assessment Improved 45 YO FEMALE WITH EPISODE OF MILD PANCREATITIS 7/11. ETIOLOGY UNCLEAR, NO IMAGING AT THAT TIME. SHE IS S/P REMOTE CHOLECYSTECTOMY. RECENTLY FOUND +ANA-NO DEFINITE UNDERLYING AUTOIMMUNE DISEASE IDENTIFIED.  R/O AUTOIMMUNE PANCREATITIS, ?SJOGRENS, R/O SMALL CBD STONE, R/O IDIOPATHIC.  SCHEDULE FOR MRI OF PANCREAS AND MRCP TO ASSESS PANCREATIC DUCTS ETC. FURTHER PLANS PENDING MRI. SHE MAY NEED FURTHER  AUTOIMMUNE MARKERS DONE NOTE + ANTIGLIADIN AB IGG ,SOME OVERLAP EXIST WITH AUTOIMMUNE DISEASES. Orders: TLB-Creatinine, Blood (82565-CREA) MRCP (MRCP) MRI Abdomen (MRI Abdomen)  Problem # 2:  FAMILY HX COLON CANCER (ICD-V16.0) Assessment: Comment Only UP TO DATE WITH SCREENING  Problem # 3:  PARESTHESIA (ICD-782.0) Assessment: Comment Only SEE NOTE,IMPROVED CURRENTLY  Patient Instructions: 1)  Get your labs drawn today in the basement before your MRI. 2)  You have been scheduled for a MRI of the abdomen and MRCP on 03/04/10 at 8:00am at [facility]. 3)  Follow-up with  Dr. Juanda Chance is on 03/29/10 at 3:30pm. 4)  Please continue current medications.  5)  Copy sent to : Alvera Novel, MD 6)  The medication list was reviewed and reconciled.  All changed / newly prescribed medications were explained.  A complete medication list was provided to the patient / caregiver.

## 2010-06-24 NOTE — Assessment & Plan Note (Signed)
Summary: 2 month rov/njr   Vital Signs:  Patient profile:   45 year old female Menstrual status:  regular LMP:     09/26/2008 Weight:      160 pounds Pulse rate:   78 / minute BP sitting:   120 / 80  (left arm) Cuff size:   regular  Vitals Entered By: Romualdo Bolk, CMA (Oct 01, 2008 2:21 PM) CC: Follow-up visit on headaches, Headache LMP (date): 09/26/2008 LMP - Character: normal Menarche (age onset): 13 years  Menses interval: 26 days  Menstrual flow (days) 6 Enter LMP: 09/26/2008   History of Present Illness: Debra [name] comes in today for follow up of HAs and medication management .    She has her diary.  Ha character hasnt really changes and imitrex works very well without sif se but doesn use it a lot.  NO progressive symptoms .   Unclear why incr frequency but some related to menses.  Since last visit had nl colonoscopy   . Gi signs are probably IBS  Headache HPI:      The headaches will last anywhere from 2 hours to 3 days at a time.  She has approximately 5+ headaches per month.  Headaches have been occurring since age 85.        The location of the headaches are unilateral-left and occipital.  Aggravating factors include bending down.  Precipitating factors consist of alcohol and changes in weather.  The headaches are associated with nausea, photophobia, and phonophobia.        Positive alarm features include change in frequency from prior H/A's, change in severity from prior H/A's, new or progressive H/A lasting days, mylagia, and malaise.  The patient denies first or worst H/A of life, change in features from prior H/A's, H/A's with Valsalva (cough/sneeze), fever, weight loss, scalp tenderness, jaw claudication, focal neurologic symptoms, confusion, seizures, and impaired level of consciousness.         Preventive Screening-Counseling & Management     Alcohol drinks/day: 0     Smoking Status: never     Passive Smoke Exposure: no     Caffeine use/day: 3    Does Patient Exercise: yes     Type of exercise: spin classes, wt,walk     Exercise (avg: min/session): >60     Times/week: 3  Current Medications (verified): 1)  Prevacid Solutab 30 Mg  Tbdp (Lansoprazole) .Marland Kitchen.. 1 Two Times A Day As Needed 2)  Imitrex 100 Mg  Tabs (Sumatriptan Succinate) .... Take 1 Tab As Needed For Headache, May Repeat in 2 Hours If Needed 3)  Advil 200 Mg  Caps (Ibuprofen) 4)  Flintstones Complete   Chew (Pediatric Multivit-Minerals-C)  Allergies (verified): 1)  Penicillin 2)  Sulfa  Past History:  Past medical, surgical, family and social histories (including risk factors) reviewed, and no changes noted (except as noted below).  Past Medical History:    Reviewed history from 06/03/2008 and no changes required:    GERD    Headache migraines    hx e dodosum   / strep and mono    staph infection right knee  1978    Dislocated Patella        Consults    Dr. Farris Has- Ortho    Dr. Gray Bernhardt    Dr. Tomasita Morrow md   Past Surgical History:    Reviewed history from 03/13/2007 and no changes required:    Cholecystectomy    Tonsillectomy  Past History:  Care Management:  OB/Gyn: Dr. Jennette Kettle    Gastroenterology: Dr. Georgina Quint  Family History:    Reviewed history from 04/13/2007 and no changes required:       father died of cardiomyopathy 04-03-2023       Family History of Alcoholism/Addiction fa       Family History Diabetes 1st degree relative mom       Family History Lung cancer mom       Family History of Arthritis       fadied chf? 10/07  Social History:    Reviewed history from 06/03/2008 and no changes required:       Occupation:pharmacist       Never Smoked       Married       dogs in hh            Passive Smoke Exposure:  no  Review of Systems  The patient denies anorexia, fever, vision loss, difficulty walking, abnormal bleeding, enlarged lymph nodes, and angioedema.    Physical Exam  General:  Well-developed,well-nourished,in no acute  distress; alert,appropriate and cooperative throughout  Head:  normocephalic and atraumatic.   Neurologic:  grossly non focal Psych:  Normal eye contact, appropriate affect. Cognition appears normal.  reviewed   HA diary   for  2 .5 months     Impression & Recommendations:  Problem # 1:  HEADACHE (ICD-784.0) sound migraine with some perimenstrual flairs.  Treatment options discussed.     at mpresent wants to wait on suppressive medications . Her updated medication list for this problem includes:    Imitrex 100 Mg Tabs (Sumatriptan succinate) .Marland Kitchen... Take 1 tab as needed for headache, may repeat in 2 hours if needed    Advil 200 Mg Caps (Ibuprofen)  Problem # 2:  GERD (ICD-530.81) Assessment: Unchanged stable  Her updated medication list for this problem includes:    Prevacid Solutab 30 Mg Tbdp (Lansoprazole) .Marland Kitchen... 1 two times a day as needed  Complete Medication List: 1)  Prevacid Solutab 30 Mg Tbdp (Lansoprazole) .Marland Kitchen.. 1 two times a day as needed 2)  Imitrex 100 Mg Tabs (Sumatriptan succinate) .... Take 1 tab as needed for headache, may repeat in 2 hours if needed 3)  Advil 200 Mg Caps (Ibuprofen) 4)  Flintstones Complete Chew (Pediatric multivit-minerals-c)  Patient Instructions: 1)  continue tracking    2)  call if wants to try  topamax or other options discussed  .  3)  rov inf progressing and alarm features .

## 2010-06-24 NOTE — Miscellaneous (Signed)
Summary: GI PV  Clinical Lists Changes  Medications: Added new medication of MOVIPREP 100 GM  SOLR (PEG-KCL-NACL-NASULF-NA ASC-C) As per prep instructions. - Signed Rx of MOVIPREP 100 GM  SOLR (PEG-KCL-NACL-NASULF-NA ASC-C) As per prep instructions.;  #1 x 0;  Signed;  Entered by: Barton Fanny RN;  Authorized by: Hart Carwin;  Method used: Electronically to [facility] [address] [phone]*, [address] [place], [place], [place]  [address], Ph: [phone] or [phone], Fax: [phone] Allergies: Changed allergy or adverse reaction from PENICILLIN to PENICILLIN Changed allergy or adverse reaction from SULFA to SULFA Observations: Added new observation of ALLERGY REV: Done (08/11/2008 11:04)    Prescriptions: MOVIPREP 100 GM  SOLR (PEG-KCL-NACL-NASULF-NA ASC-C) As per prep instructions.  #1 x 0   Entered by:   Barton Fanny RN   Authorized by:   Hart Carwin   Signed by:   Barton Fanny RN on 08/11/2008   Method used:   Electronically to        [facility] [address] [phone]* (retail)       [address]       [place], [place]  [address]       Ph: [phone] or [phone]       Fax: [phone]   RxID:   [id number]

## 2010-06-24 NOTE — Assessment & Plan Note (Signed)
Summary: ST/EAR PAIN/NJR   Vital Signs:  Patient Profile:   45 Years Old Female Weight:      158 pounds Temp:     98.4 degrees F oral Pulse rate:   78 / minute BP sitting:   110 / 80  (left arm) Cuff size:   regular  Vitals Entered By: Romualdo Bolk, CMA (June 20, 2007 1:13 PM)                 Chief Complaint:  Sore throat and ear pain.  History of Present Illness: Debra [name] is here for a sore throat that started 06/16/07 and left ear pain that started 06/20/07. Some coughing and congestion but no fever.  Hurts to swallow .  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    Current Allergies (reviewed today): ! PENICILLIN ! SULFA  Past Medical History:    Reviewed history from 04/13/2007 and no changes required:       GERD       Headache migraines       hx e dodosum   / strep and mono       staph infection right knee  1978   Social History:    Reviewed history from 03/13/2007 and no changes required:       Occupation:pharmacist       Never Smoked    Review of Systems  The patient denies fever, hoarseness, chest pain, prolonged cough, hemoptysis, and abdominal pain.     Physical Exam  General:     mildly ill in NAD  Head:     normocephalic and atraumatic.   Eyes:     vision grossly intact, pupils equal, pupils round, and pupils reactive to light.   Ears:     R ear normal, L ear normal, and no external deformities.   Nose:     no external erythema and mucosal erythema.  face nt Mouth:     erythema  bilaterally 2+ and o edema or lesions  airway good Neck:     supple, full ROM, no masses, and no thyromegaly.   Lungs:     Normal respiratory effort, chest expands symmetrically. Lungs are clear to auscultation, no crackles or wheezes. Heart:     normal rate, regular rhythm, and no murmur.   Cervical Nodes:     tender shoddy left ac node no other adenopathy    Impression & Recommendations:  Problem # 1:  ACUTE  PHARYNGITIS (ICD-462) with radiating pain to ear  . REc sx relief and recheck as needed . Expectant management  Her updated medication list for this problem includes:    Advil 200 Mg Caps (Ibuprofen)  Orders: Rapid Strep (40102)   Problem # 2:  OTALGIA (ICD-388.70) see above referred  Complete Medication List: 1)  Prevacid Solutab 30 Mg Tbdp (Lansoprazole) .Marland Kitchen.. 1 two times a day as needed 2)  Imitrex 100 Mg Tabs (Sumatriptan succinate) 3)  Advil 200 Mg Caps (Ibuprofen)     ] Laboratory Results   Date/Time Reported: June 20, 2007 1:40 PM   Other Tests  Rapid Strep: negative Comments ..................................................................Marland KitchenWynona Canes, CMA  June 20, 2007 1:40 PM

## 2010-06-24 NOTE — Assessment & Plan Note (Signed)
Summary: consult re: stomach issues/bloating/nausea for 2 wks/cjr   Vital Signs:  Patient profile:   45 year old female Menstrual status:  regular LMP:     10/05/2009 Weight:      162 pounds BMI:     25.46 Temp:     98.5 degrees F oral Pulse rate:   60 / minute BP sitting:   110 / 70  (left arm) Cuff size:   regular  Vitals Entered By: Romualdo Bolk, CMA Duncan Dull) (Oct 14, 2009 12:35 PM) CC: Pt is having pain, cramping and swelling on 5/6 and treated with OTC yeast medication. Then called Ob and recieved diflucan. Then on 5/12 saw Julio Sicks and was dx with yeast gave her mytrex cream and diflucan. Then on 5/13 and they called her saying that she had a bacterial infection called gardielle and gave her flagyl 500mg . She took it for 5 days. She did call them back on 5/17 and they called her macrodantin this time but pt hasn't taken it. Pt had diarrhea last few weeks on and off. Stools are pure liquid. But today they are more formed. LMP (date): 10/05/2009 LMP - Character: normal Menarche (age onset years): 13   Menses interval (days): 26 Menstrual flow (days): 6 Enter LMP: 10/05/2009   History of Present Illness: Debra [name] comes in today     for above . had vaginal dc and was rx with flagyl for gardnerella based on testing  .  She devloped cramps and loose stools on the med  last dose  May 17the   had diarrhea  and then multicolored stools without blood  and then bloating and pain and burning.  Waited to see if would subside off med and got some better but then yeasterday hard to work for trips to the bathroom.    No blood but loose. No vomiting  She has hs of IBs type signs and had acolonoscopy within the past 2 years  but this is different.  noon e sick at home and hx of travel related signs      Preventive Screening-Counseling & Management  Alcohol-Tobacco     Alcohol drinks/day: 0     Smoking Status: never     Passive Smoke Exposure:  no  Caffeine-Diet-Exercise     Caffeine use/day: 3     Does Patient Exercise: yes     Type of exercise: spin classes, wt,walk     Exercise (avg: min/session): >60     Times/week: 3  Current Medications (verified): 1)  Prevacid Solutab 30 Mg  Tbdp (Lansoprazole) .Marland Kitchen.. 1 Two Times A Day As Needed 2)  Imitrex 100 Mg  Tabs (Sumatriptan Succinate) .... Take 1 Tab As Needed For Headache, May Repeat in 2 Hours If Needed 3)  Advil 200 Mg  Caps (Ibuprofen) 4)  Flintstones Complete   Chew (Pediatric Multivit-Minerals-C)  Allergies (verified): 1)  Penicillin 2)  Sulfa  Past History:  Past medical, surgical, family and social histories (including risk factors) reviewed, and no changes noted (except as noted below).  Past Medical History: Reviewed history from 06/03/2008 and no changes required. GERD Headache migraines hx e dodosum   / strep and mono staph infection right knee  1978 Dislocated Patella  Consults Dr. Farris Has- Ortho Dr. Gray Bernhardt Dr. Tomasita Morrow md   Past Surgical History: Reviewed history from 03/13/2007 and no changes required. Cholecystectomy Tonsillectomy  Family History: Reviewed history from 04/13/2007 and no changes required. father died of cardiomyopathy 04-09-23 Family History of Alcoholism/Addiction fa  Family History Diabetes 1st degree relative mom Family History Lung cancer mom Family History of Arthritis fadied chf? 10/07  Social History: Reviewed history from 06/03/2008 and no changes required. 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  The patient denies fever, weight loss, weight gain, vision loss, prolonged cough, hemoptysis, melena, hematochezia, severe indigestion/heartburn, hematuria, muscle weakness, transient blindness, depression, abnormal bleeding, enlarged lymph nodes, and angioedema.    Physical Exam  General:  Well-developed,well-nourished,in no acute  distress; alert,appropriate and cooperative throughout examination Head:  normocephalic and atraumatic.   Eyes:  vision grossly intact.   Mouth:  pharynx pink and moist.   Neck:  No deformities, masses, or tenderness noted. Lungs:  Normal respiratory effort, chest expands symmetrically. Lungs are clear to auscultation, no crackles or wheezes.no dullness.   Heart:  Normal rate and regular rhythm. S1 and S2 normal without gallop, murmur, click, rub or other extra sounds.no lifts.   Abdomen:  Bowel sounds positive,abdomen soft and without masses, organomegaly or hernias noted. soreness but no g or r   and no localized tenderness Msk:  no joint warmth and no redness over joints.   Pulses:  nl cap refill  Extremities:  no clubbing cyanosis or edema  Neurologic:  non focal  Skin:  turgor normal, color normal, no ecchymoses, and no petechiae.   Cervical Nodes:  No lymphadenopathy noted Inguinal Nodes:  No significant adenopathy Psych:  Oriented X3, good eye contact, not anxious appearing, and not depressed appearing.     Impression & Recommendations:  Problem # 1:  DIARRHEA (ICD-787.91) related to onset of flagyl.     poss residual med effect less likely to be c diff but will check this.  takes prevacid as needed and not related .  if persistent or  progressive  consider check for othercauses  celiac  etc and follow up with  Gi .   No other alarm   features today.   Orders: T-Culture, C-Diff Toxin A/B ([id number]) T-Culture, Stool ([id number])  Problem # 2:  hx of vaginitis  hd diflucan and metronidazole rx   Problem # 3:  ABDOMINAL BLOATING (ICD-787.3) Assessment: Deteriorated  Problem # 4:  GERD (ICD-530.81) taking med as needed.  Her updated medication list for this problem includes:    Prevacid Solutab 30 Mg Tbdp (Lansoprazole) .Marland Kitchen... 1 two times a day as needed  Complete Medication List: 1)  Prevacid Solutab 30 Mg Tbdp (Lansoprazole) .Marland Kitchen.. 1 two times a day as needed 2)   Imitrex 100 Mg Tabs (Sumatriptan succinate) .... Take 1 tab as needed for headache, may repeat in 2 hours if needed 3)  Advil 200 Mg Caps (Ibuprofen) 4)  Flintstones Complete Chew (Pediatric multivit-minerals-c)  Other Orders: T-Stool Giardia / Crypto- EIA (09811) T-Fecal Lactoferrin (91478)  Patient Instructions: 1)  Stool tests for infection although sound like a reaction to the medication.     2)  in the meantime   we can try something  Like align .     for a week  3)  You will be informed of lab results when available.  4)  if persistent or  progressive  then call for further advice.   Laboratory Results   Urine Tests    Routine Urinalysis   Color: yellow Appearance: Clear Glucose: negative   (Normal Range: Negative) Bilirubin: negative   (Normal Range: Negative) Ketone: negative   (Normal Range: Negative)  Spec. Gravity: 1.025   (Normal Range: 1.003-1.035) Blood: negative   (Normal Range: Negative) pH: 7.0   (Normal Range: 5.0-8.0) Protein: negative   (Normal Range: Negative) Urobilinogen: 0.2   (Normal Range: 0-1) Nitrite: negative   (Normal Range: Negative) Leukocyte Esterace: negative   (Normal Range: Negative)    Comments: Rita Ohara  Oct 14, 2009 12:58 PM

## 2010-06-24 NOTE — Letter (Signed)
Summary: [facility]   Imported By: Maryln Gottron 02/05/2010 13:58:44  _____________________________________________________________________  External Attachment:    Type:   Image     Comment:   External Document

## 2010-06-24 NOTE — Assessment & Plan Note (Signed)
Summary: hep b shot/shannon/mhf ok per shannon  Nurse Visit    Prior Medications: PREVACID SOLUTAB 30 MG  TBDP (LANSOPRAZOLE) 1 two times a day as needed IMITREX 100 MG  TABS (SUMATRIPTAN SUCCINATE)  ADVIL 200 MG  CAPS (IBUPROFEN)  FLINTSTONES COMPLETE   CHEW (PEDIATRIC MULTIVIT-MINERALS-C)  HYCODAN 5-1.5 MG/5ML  SYRP (HYDROCODONE-HOMATROPINE) 1-2 tsp by mouth q4-6 hours as needed cough ZITHROMAX 250 MG  TABS (AZITHROMYCIN) 2 by mouth day 1 then 1 by mouth once daily for 4 days PREDNISONE 20 MG  TABS (PREDNISONE) UAD Current Allergies: ! PENICILLIN ! SULFA   Hepatitis B Vaccine # 1    Vaccine Type: HepB Adult    Site: left deltoid    Mfr: GlaxoSmithKline    Dose: 0.5 ml    Route: IM    Given by: Romualdo Bolk, CMA    Exp. Date: 04/30/2010    Lot #: [id number]   Orders Added: 1)  Hepatitis B Vaccine >51yrs [90746] 2)  Admin 1st Vaccine [email or web address]    ]

## 2010-06-24 NOTE — Assessment & Plan Note (Signed)
Summary: upper back tingling and skin crawling/ok per md/ssc   Vital Signs:  Patient Profile:   45 Years Old Female Weight:      155 pounds Temp:     98.7 degrees F oral Pulse rate:   78 / minute BP sitting:   110 / 70  (left arm) Cuff size:   regular  Vitals Entered By: Romualdo Bolk, CMA (March 13, 2007 10:39 AM)                 Chief Complaint:  Rt upper back tingling x 2-3 weeks.  History of Present Illness: Debra [name] compains of onset of 2 weks hx of skin crawling feelong without pain or burning and no rash.  no aggravators and no  mititgaators.  no assoc symptom No change when works out in [employer]  No fever or injury or radiation .  Current Allergies (reviewed today): ! PENICILLIN ! SULFA  Past Medical History:    Reviewed history and no changes required:       GERD       Headache  Past Surgical History:    Cholecystectomy    Tonsillectomy   Social History:    Reviewed history and no changes required:       Occupation:pharmacist       Never Smoked   Risk Factors:  Tobacco use:  never   Review of Systems       see hpi   Physical Exam  General:     alert, well-developed, well-nourished, and well-hydrated.   Mouth:     pharynx pink and moist.   Neck:     No deformities, masses, or tenderness noted. no radiating pain Lungs:     Normal respiratory effort, chest expands symmetrically. Lungs are clear to auscultation, no crackles or wheezes. Heart:     Normal rate and regular rhythm. S1 and S2 normal without gallop, murmur, click, rub or other extra sounds. Msk:     no weakness of shouder or neck   normal ROM.   Extremities:     no edema Neurologic:     alert & oriented X3 and gait normal.   Skin:     turgor normal and color normal.   Cervical Nodes:     No lymphadenopathy noted Psych:     normally interactive and good eye contact.      Impression & Recommendations:  Problem # 1:  PARESTHESIA (ICD-782.0) Curious .sx  but no obvious dysfunction or rash.  Possiblr neutitis....to try antiinflammatory over the next 2 weeks with GI protection iif not improved call and do cxray if ok will follow but if progresses then recheck  Complete Medication List: 1)  Prevacid Solutab 30 Mg Tbdp (Lansoprazole) .Marland Kitchen.. 1 two times a day as needed 2)  Imitrex 100 Mg Tabs (Sumatriptan succinate) 3)  Advil 200 Mg Caps (Ibuprofen)     ]

## 2010-06-24 NOTE — Letter (Signed)
Summary: New Patient letter  [facility]  [address] [place], [place] [address]   Phone: [phone]  Fax: [phone]       12/21/2009 MRN: [id number]  Debra [name] [address] [place], [place]  [address]  Dear Debra [name],  Welcome to the Gastroenterology Division at [facility].    You are scheduled to see Dr.  Juanda Chance on 02-24-10 at 9:15am on the 3rd floor at [facility], [address]  We ask that you try to arrive at our office 15 minutes prior to your appointment time to allow for check-in.  We would like you to complete the enclosed self-administered evaluation form prior to your visit and bring it with you on the day of your appointment.  We will review it with you.  Also, please bring a complete list of all your medications or, if you prefer, bring the medication bottles and we will list them.  Please bring your insurance card so that we may make a copy of it.  If your insurance requires a referral to see a specialist, please bring your referral form from your primary care physician.  Co-payments are due at the time of your visit and may be paid by cash, check or credit card.     Your office visit will consist of a consult with your physician (includes a physical exam), any laboratory testing he/she may order, scheduling of any necessary diagnostic testing (e.g. x-ray, ultrasound, CT-scan), and scheduling of a procedure (e.g. Endoscopy, Colonoscopy) if required.  Please allow enough time on your schedule to allow for any/all of these possibilities.    If you cannot keep your appointment, please call [phone] to cancel or reschedule prior to your appointment date.  This allows [place] the opportunity to schedule an appointment for another patient in need of care.  If you do not cancel or reschedule by 5 p.m. the business day prior to your appointment date, you will be charged a $50.00 late cancellation/no-show fee.    Thank you for  choosing [facility] for your medical needs.  We appreciate the opportunity to care for you.  Please visit [place] at our website  to learn more about our practice.                     Sincerely,                                                             The Gastroenterology Division

## 2010-06-24 NOTE — Letter (Signed)
Summary: [facility]   Imported By: Maryln Gottron 10/21/2009 15:50:50  _____________________________________________________________________  External Attachment:    Type:   Image     Comment:   External Document

## 2010-06-24 NOTE — Procedures (Signed)
Summary: EGD  egd report   Imported By: Kassie Mends 03/15/2007 15:47:46  _____________________________________________________________________  External Attachment:    Type:   Image     Comment:   egd report  Appended Document: EGD RUT NEGATIVE

## 2010-06-24 NOTE — Assessment & Plan Note (Signed)
Summary: hip/pelvic joint pain/lower back pain/cjr   Vital Signs:  Patient profile:   45 year old female Menstrual status:  regular LMP:     02/02/2009 Weight:      162 pounds Temp:     98.3 degrees F oral Pulse rate:   60 / minute BP sitting:   100 / 70  (left arm) Cuff size:   regular  Vitals Entered By: Romualdo Bolk, CMA (AAMA) (February 23, 2009 4:13 PM) CC: Bilateral hip pain, Rt worse than left and lower back pain. This is a constant pain that can get worse at times. This has been going on for 3 weeks. LMP (date): 02/02/2009 LMP - Character: normal Menarche (age onset years): 13   Menses interval (days): 26 Menstrual flow (days): 6 Enter LMP: 02/02/2009   History of Present Illness: Debra [name] comes in today for   for above problem   .    Over the last month  increasingright hip area pain  pain for a month.  hurts at night  and bothers her sleep.  In the past poss   spinning classes.   caused some right hip pain and had  an mri   2 years ago  ? ( Dr Farris Has )bursitis. She altered her exercise and did better . Has used antiinflammatories sparingly because of GERD problem. denies anyinjury and not worse with walk  but hurts her to sleepat night.  back pain not radiating to legs but constant and  difficult at work.   No fever weight loss .   Preventive Screening-Counseling & Management  Alcohol-Tobacco     Alcohol drinks/day: 0     Smoking Status: never     Passive Smoke Exposure: no  Caffeine-Diet-Exercise     Caffeine use/day: 3     Does Patient Exercise: yes     Type of exercise: spin classes, wt,walk     Exercise (avg: min/session): >60     Times/week: 3  Current Medications (verified): 1)  Prevacid Solutab 30 Mg  Tbdp (Lansoprazole) .Marland Kitchen.. 1 Two Times A Day As Needed 2)  Imitrex 100 Mg  Tabs (Sumatriptan Succinate) .... Take 1 Tab As Needed For Headache, May Repeat in 2 Hours If Needed 3)  Advil 200 Mg  Caps (Ibuprofen) 4)  Flintstones Complete   Chew  (Pediatric Multivit-Minerals-C)  Allergies (verified): 1)  Penicillin 2)  Sulfa  Past History:  Past medical, surgical, family and social histories (including risk factors) reviewed, and no changes noted (except as noted below).  Past Medical History: Reviewed history from 06/03/2008 and no changes required. GERD Headache migraines hx e dodosum   / strep and mono staph infection right knee  1978 Dislocated Patella  Consults Dr. Farris Has- Ortho Dr. Gray Bernhardt Dr. Tomasita Morrow md   Past Surgical History: Reviewed history from 03/13/2007 and no changes required. Cholecystectomy Tonsillectomy  Past History:  Care Management: OB/Gyn: Dr. Jennette Kettle Gastroenterology: Dr. Georgina Quint Orthopedics: Farris Has  Family History: Reviewed history from 04/13/2007 and no changes required. father died of cardiomyopathy 04-21-2023 Family History of Alcoholism/Addiction fa Family History Diabetes 1st degree relative mom Family History Lung cancer mom Family History of Arthritis fadied chf? 10/07  Social History: Reviewed history from 06/03/2008 and no changes required. 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         ?  if has abd bloating   Physical Exam  General:  Well-developed,well-nourished,in no acute distress; alert,appropriate and cooperative throughout examination Head:  normocephalic and atraumatic.   Eyes:  vision grossly intact and pupils equal.   Neck:  No deformities, masses, or tenderness noted. Lungs:  Normal respiratory effort, chest expands symmetrically. Lungs are clear to auscultation, no crackles or wheezes.no dullness.   Heart:  Normal rate and regular rhythm. S1 and S2 normal without gallop, murmur, click, rub or other extra  sounds.no lifts.   Abdomen:  soft, non-tender, normal bowel sounds, no distention, no masses, no inguinal hernia, no hepatomegaly, and no splenomegaly.   Msk:  no joint swelling and no joint warmth.  tender over ls area  anf local area superior right hip area just below iliac crest     Pulses:  pulses intact without delay   Extremities:  no clubbing cyanosis or edema  Neurologic:  alert & oriented X3, gait normal, and DTRs symmetrical and normal.   Skin:  turgor normal, color normal, no ecchymoses, and no petechiae.   Cervical Nodes:  No lymphadenopathy noted Psych:  Oriented X3, good eye contact, not anxious appearing, and not depressed appearing.     Impression & Recommendations:  Problem # 1:  BACK PAIN (ICD-724.5) Assessment Deteriorated  low back becoming more problematic expecially with standing 12 hours at work .   Has never had back imaging  despited  prolonged  signs  Her updated medication list for this problem includes:    Advil 200 Mg Caps (Ibuprofen)  Orders: Radiology Referral (Radiology)  Problem # 2:  HIP PAIN, RIGHT (ICD-719.45) nocturnal pain  ? cause   atypical for hip joint and not worse with walking.   ? if referred or  MS  avulsion injury?   does not really seem as referred from abdomen  but pt asks ?s about this .   will follow up as appropriate .  Her updated medication list for this problem includes:    Advil 200 Mg Caps (Ibuprofen)  Orders: Radiology Referral (Radiology)  Complete Medication List: 1)  Prevacid Solutab 30 Mg Tbdp (Lansoprazole) .Marland Kitchen.. 1 two times a day as needed 2)  Imitrex 100 Mg Tabs (Sumatriptan succinate) .... Take 1 tab as needed for headache, may repeat in 2 hours if needed 3)  Advil 200 Mg Caps (Ibuprofen) 4)  Flintstones Complete Chew (Pediatric multivit-minerals-c)  Other Orders: UA Dipstick w/o Micro (automated)  (81003)  Patient Instructions: 1)  will  call about MRI of back and right hip area.    2)  then plan   follow up .      Laboratory Results   Urine Tests    Routine Urinalysis   Color: yellow Appearance: Hazy Glucose: negative   (Normal Range: Negative) Bilirubin: negative   (Normal Range: Negative) Ketone: negative   (Normal Range: Negative) Spec. Gravity: 1.020   (Normal Range: 1.003-1.035) Blood: trace-lysed   (Normal Range: Negative) pH: 5.0   (Normal Range: 5.0-8.0) Protein: negative   (Normal Range: Negative) Urobilinogen: 0.2   (Normal Range: 0-1) Nitrite: negative   (Normal Range: Negative) Leukocyte Esterace: trace   (Normal Range: Negative)    Comments: Joanne Chars CMA  February 23, 2009 3:58 PM

## 2010-06-24 NOTE — Progress Notes (Signed)
Summary: sore throat  Phone Note Call from Patient   Caller: Patient Call For: Dr. Fabian Sharp Summary of Call: Pt. started with sore throat again today that is much worse.  Would like an Antibiotic called to [facility]), if possible. Allergies to Penicillin and sulfa.  Initial call taken by: Lynann Beaver CMA,  July 04, 2007 11:32 AM  Follow-up for Phone Call        Per md- is she running a fever, any coughing or congestion. When did this start? Follow-up by: Romualdo Bolk, CMA,  July 04, 2007 11:37 AM  Additional Follow-up for Phone Call Additional follow up Details #1::        Sore throat started again last night, no fever, a tiny bit of cough....no congestion. Additional Follow-up by: Lynann Beaver CMA,  July 04, 2007 12:48 PM    Additional Follow-up for Phone Call Additional follow up Details #2::    if we think this is sinusitis can she take gen omnicef. If so give 300mg   two times a day x 10 days  Follow-up by: Madelin Headings MD,  July 04, 2007 1:15 PM  New/Updated Medications: OMNICEF 300 MG  CAPS (CEFDINIR) one two times a day x 10 days   Prescriptions: OMNICEF 300 MG  CAPS (CEFDINIR) one two times a day x 10 days  #20 x 0   Entered by:   Lynann Beaver CMA   Authorized by:   Stacie Glaze MD   Signed by:   Lynann Beaver CMA on 07/04/2007   Method used:   Electronically sent to ...       [facility]  [address] #[id number]*       [address]       [place], [place]  [address]       Ph: [phone] or [phone]       Fax: [phone]   RxID:   [id number] OMNICEF 300 MG  CAPS (CEFDINIR) one two times a day x 10 days  #20 x 0   Entered by:   Lynann Beaver CMA   Authorized by:   Stacie Glaze MD   Signed by:   Lynann Beaver CMA on 07/04/2007   Method used:   Electronically sent to ...       [facility]  [address] #[id number]*       [address]       [place], [place]   [address]       Ph: [phone] or [phone]       Fax: [phone]   RxID:   [id number]

## 2010-06-24 NOTE — Progress Notes (Signed)
Summary: FYI  Phone Note Call from Patient   Caller: Patient Call For: Madelin Headings MD Summary of Call: Pt just left the ER and was diagnosed with Pancreatitis. [phone] Just FYI for pt. Initial call taken by: Lynann Beaver CMA,  December 18, 2009 9:47 AM  Follow-up for Phone Call        get reord to review  ROV next week  Follow-up by: Madelin Headings MD,  December 18, 2009 1:08 PM  Additional Follow-up for Phone Call Additional follow up Details #1::        Spoke with pt and she could only come on monday or tues. Appt scheduled for Monday. Pt working on Wed and will be out of town the rest of the week. Additional Follow-up by: Romualdo Bolk, CMA Duncan Dull),  December 18, 2009 2:49 PM

## 2010-06-24 NOTE — Assessment & Plan Note (Signed)
Summary: cough/mhf/PT RESCD FROM YESTERDAY/CCM   Vital Signs:  Patient Profile:   45 Years Old Female Weight:      159 pounds Pulse rate:   66 / minute BP sitting:   120 / 80  (right arm) Cuff size:   regular  Vitals Entered By: Romualdo Bolk, CMA (July 24, 2007 3:33 PM)                 Chief Complaint:  Cough.  History of Present Illness: Debra [name] is here for ongoing cough x 2 months. Pt states that it's deep in chest and has some congestion but can't cough anything up.  back to gym  cough some.   No fever.  No wheezing.   sinuses are better.   throat is still irritated sore in am.    Sore in middle of chest but no other chest pain and no assoc symptom  ? if reflux   using prevacid two times a day for the last week.    No hx asthma and no real sob  no ets.     Current Allergies (reviewed today): ! PENICILLIN ! SULFA  Past Medical History:    Reviewed history from 04/13/2007 and no changes required:       GERD       Headache migraines       hx e dodosum   / strep and mono       staph infection right knee  1978  Past Surgical History:    Reviewed history from 03/13/2007 and no changes required:       Cholecystectomy       Tonsillectomy   Family History:    Reviewed history from 04/13/2007 and no changes required:       father died of cardiomyopathy 18-Apr-2023       Family History of Alcoholism/Addiction fa       Family History Diabetes 1st degree relative mom       Family History Lung cancer mom       Family History of Arthritis       fadied chf? 10/07  Social History:    Occupation:pharmacist    Never Smoked    Married    dogs in hh    Review of Systems  The patient denies anorexia, fever, weight loss, hoarseness, syncope, dyspnea on exhertion, peripheral edema, and hemoptysis.     Physical Exam  General:     alert, well-developed, well-nourished, and well-hydrated.   Head:     normocephalic and atraumatic.   Eyes:     vision  grossly intact, pupils equal, and pupils round.  no redness or discharge Ears:     R ear normal, L ear normal, and no external deformities.   Nose:     no external deformity and no external erythema.  minimla congestion and no face pain Mouth:     pharynx pink and moist.  no drainage seen ? cobblestoning Neck:     No deformities, masses, or tenderness noted.no thyromegaly.   Lungs:     Normal respiratory effort, chest expands symmetrically. Lungs are clear to auscultation, no crackles or wheezes. Heart:     Normal rate and regular rhythm. S1 and S2 normal without gallop, murmur, click, rub or other extra sounds. Extremities:     no cce  Neurologic:     intact Cervical Nodes:     no anterior cervical adenopathy and no posterior cervical adenopathy.  Impression & Recommendations:  Problem # 1:  COUGH (ICD-786.2) prolonged    almost a month of symptom  and rx for sinusitis  consider post viral persistence or mycoplasma     empiric rx  and follow up   Problem # 2:  GERD (ICD-530.81) aggravated and agree with temporary increase in dosing  Her updated medication list for this problem includes:    Prevacid Solutab 30 Mg Tbdp (Lansoprazole) .Marland Kitchen... 1 two times a day as needed   Complete Medication List: 1)  Prevacid Solutab 30 Mg Tbdp (Lansoprazole) .Marland Kitchen.. 1 two times a day as needed 2)  Imitrex 100 Mg Tabs (Sumatriptan succinate) 3)  Advil 200 Mg Caps (Ibuprofen) 4)  Flintstones Complete Chew (Pediatric multivit-minerals-c) 5)  Hycodan 5-1.5 Mg/35ml Syrp (Hydrocodone-homatropine) .Marland Kitchen.. 1-2 tsp by mouth q4-6 hours as needed cough 6)  Zithromax 250 Mg Tabs (Azithromycin) .... 2 by mouth day 1 then 1 by mouth once daily for 4 days   Patient Instructions: 1)  call in 7-10 days about progress 2)   we will consider chest xray.    if not better 3)  continue two times a day prevacid for now.     Prescriptions: ZITHROMAX 250 MG  TABS (AZITHROMYCIN) 2 by mouth day 1 then 1 by mouth  once daily for 4 days  #6 x 0   Entered and Authorized by:   Madelin Headings MD   Signed by:   Madelin Headings MD on 07/24/2007   Method used:   Print then Give to Patient   RxID:   [id number] HYCODAN 5-1.5 MG/5ML  SYRP (HYDROCODONE-HOMATROPINE) 1-2 tsp by mouth q4-6 hours as needed cough  #6   oz x 0   Entered and Authorized by:   Madelin Headings MD   Signed by:   Madelin Headings MD on 07/24/2007   Method used:   Print then Give to Patient   RxID:   [id number]  ]

## 2010-06-24 NOTE — Progress Notes (Signed)
Summary: MRI results.  Phone Note Call from Patient   Caller: Patient Call For: Madelin Headings MD Summary of Call: Pt is calling for results of MRI. [phone] Initial call taken by: Lynann Beaver CMA,  March 04, 2009 11:13 AM  Follow-up for Phone Call        see my append Follow-up by: Madelin Headings MD,  March 04, 2009 12:34 PM  Additional Follow-up for Phone Call Additional follow up Details #1::        Pt aware of results. Additional Follow-up by: Romualdo Bolk, CMA (AAMA),  March 04, 2009 3:22 PM

## 2010-06-24 NOTE — Assessment & Plan Note (Signed)
Summary: legs tingling and cramping/ssc   Vital Signs:  Patient profile:   45 year old female Menstrual status:  regular LMP:     12/26/2009 Weight:      163 pounds Temp:     97.9 degrees F oral Pulse rate:   66 / minute BP sitting:   110 / 70  (left arm) Cuff size:   regular  Vitals Entered By: Romualdo Bolk, CMA Duncan Dull) (January 06, 2010 9:54 AM) CC: Both legs are tingling and cramping since 2 weeks prior to beginning admited to the hospital for pancrititis. LMP (date): 12/26/2009 LMP - Character: normal Menarche (age onset years): 13   Menses interval (days): 26 Menstrual flow (days): 6 Enter LMP: 12/26/2009   History of Present Illness: Debra [name]  comesin comes in today  for acute sda  visit for above problem.  Her abd pain had subsided but doesnt have gi  appt until october . but on a call back list . Had one day of epigastric pain and nausea  and then resolved  lasted  36 hours or so.  recurrent  more often for the past  months  .   Then about July 20 had left leg cramping  / after exercise  and now feels tingling.  in feet  and feels weak.  below knees  only painful when cramping.   stands a lot at work and noted  tingling  after minimal standing Tingling off and on a week . also tends to get  leg cramps at night . Has back pain off and on . GERD  stable  takes prevacid but not always every day.   Took one b supplement yesterday . NOt a vegetarian.   Preventive Screening-Counseling & Management  Alcohol-Tobacco     Alcohol drinks/day: 0     Smoking Status: never     Passive Smoke Exposure: no  Caffeine-Diet-Exercise     Caffeine use/day: 3     Does Patient Exercise: yes     Type of exercise: spin classes, wt,walk     Exercise (avg: min/session): >60     Times/week: 3  Current Medications (verified): 1)  Prevacid Solutab 30 Mg  Tbdp (Lansoprazole) .Marland Kitchen.. 1 Two Times A Day As Needed 2)  Imitrex 100 Mg  Tabs (Sumatriptan Succinate) .... Take 1 Tab As  Needed For Headache, May Repeat in 2 Hours If Needed 3)  Advil 200 Mg  Caps (Ibuprofen) 4)  Flintstones Complete   Chew (Pediatric Multivit-Minerals-C) 5)  Hydrocodone-Acetaminophen 5-325 Mg Tabs (Hydrocodone-Acetaminophen)  Allergies (verified): 1)  Penicillin 2)  Sulfa  Past History:  Past medical, surgical, family and social histories (including risk factors) reviewed, and no changes noted (except as noted below).  Past Medical History: GERD Headache migraines hx e dodosum   / strep and mono staph infection right knee  1978 Dislocated Patella back pain   hx of mri of back 2010 Ed visit abdominal pain and elevated lipase  Consults Dr. Farris Has- Ortho Dr. Gray Bernhardt Dr. Tomasita Morrow md   Past Surgical History: Reviewed history from 03/13/2007 and no changes required. Cholecystectomy Tonsillectomy  Past History:  Care Management: OB/Gyn: Dr. Jennette Kettle Gastroenterology: Dr. Georgina Quint Orthopedics: Farris Has  Family History: Reviewed history from 12/21/2009 and no changes required. father died of cardiomyopathy April 03, 2023 Family History of Alcoholism/Addiction fa Family History Diabetes 1st degree relative mom Family History Lung cancer mom Family History of Arthritis fadied chf? 10/07  Maternal GF pancereatitis   not alcohol. f  Social History:  Reviewed history from 06/03/2008 and no changes required. 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    Physical Exam  General:  Well-developed,well-nourished,in no acute distress; alert,appropriate and cooperative throughout examination Head:  normocephalic and atraumatic.   Eyes:  vision grossly intact and pupils equal.   Lungs:  normal  respiratory effort, no intercostal retractions, and no accessory muscle use.   Heart:  normal rate, regular rhythm, and no murmur.   Abdomen:  Bowel sounds positive,abdomen soft and non-tender without masses, organomegaly or  noted. Msk:  no joint swelling, no joint warmth, and no redness over joints.   Pulses:  pulses intact without delay   Extremities:  no clubbing cyanosis or edema   trc edema left  large  mildly tender varicosity left medial calf no redness or ulcers   Neurologic:  alert & oriented X3, strength normal in all extremities, gait normal, and DTRs symmetrical and normal.   brisk  le  no clonus  Skin:  turgor normal, color normal, no ecchymoses, and no petechiae.   Cervical Nodes:  No lymphadenopathy noted Psych:  Oriented X3, normally interactive, good eye contact, not anxious appearing, and not depressed appearing.     Impression & Recommendations:  Problem # 1:  PARESTHESIA (ICD-782.0)  feet  ? cause  newer onset  check mg etc as on ppi .  check b12 and MMA    celiac etc.    had  cbc done in hospital and ok. ? will review this  Orders: TLB-Amylase (82150-AMYL) TLB-BMP (Basic Metabolic Panel-BMET) (80048-METABOL) TLB-Magnesium (Mg) (83735-MG) TLB-B12 + Folate Pnl ([id number]-J47/WGN) T- * Misc. Laboratory test [phone]) T- * Misc. Laboratory test [id number]) TLB-IgA (Immunoglobulin A) (82784-IGA) Venipuncture (84696) Specimen Handling (29528) TLB-CRP-High Sensitivity (C-Reactive Protein) (86140-FCRP)  Problem # 2:  CRAMP OF LIMB (ICD-729.82) see above ? if from VV other   Problem # 3:  VARICOSE VEIN (ICD-456.8) poss  contributing  to signs but not the tingling.   Problem # 4:  ABDOMINAL PAIN, EPIGASTRIC (ICD-789.06) recurrent episodes increase recently   mild  this past week has gi appt  but  not until october   .   hx of elevated amylase and lipase  ? cause   Problem # 5:  LIVER FUNCTION TESTS, ABNORMAL (ICD-794.8)  mild   elevation  will recheck today.   Orders: TLB-Hepatic/Liver Function Pnl (80076-HEPATIC) T-Antinuclear Antib (ANA) [phone]) Venipuncture (72536) Specimen Handling (64403) TLB-CRP-High Sensitivity (C-Reactive Protein) (86140-FCRP)  Complete Medication List: 1)  Prevacid Solutab 30 Mg Tbdp (Lansoprazole) .Marland Kitchen.. 1 two times a day as needed 2)  Imitrex 100 Mg Tabs (Sumatriptan succinate) .... Take 1 tab as needed for headache, may repeat in 2 hours if needed 3)  Advil 200 Mg Caps (Ibuprofen) 4)  Flintstones Complete Chew (Pediatric multivit-minerals-c) 5)  Hydrocodone-acetaminophen 5-325 Mg Tabs (Hydrocodone-acetaminophen)  Patient Instructions: 1)  You will be informed of lab results when available.  2)  i would consider a   varicose vein referral  for possible cause of your signs .   3)  ok to take b12  supplement

## 2010-06-24 NOTE — Assessment & Plan Note (Signed)
Summary: coughing, congestion, left sided neck and jaw pain/ssc   Vital Signs:  Patient Profile:   45 Years Old Female Weight:      157 pounds Temp:     98.1 degrees F oral BP sitting:   120 / 72  (left arm)  Vitals Entered By: Rock Nephew CMA (August 02, 2007 4:11 PM)                 Chief Complaint:  left side jaw and neck pain with cough.  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  She said her cough now for about a month.  She's taking two round of antibiotics, but it hasn't helped.  She describes the cough is fairly constant, however, it does not keep her awake at night.  She has no sputum production.  She does have a sensation of tightness in her chest.  She is never any allergies nor asthma.  And she is a nonsmoker.  The second problem is pain behind her left jaw.  This started fairly suddenly about 8 o'clock last night.  She was not shooting at the time.  She's had no recent dental care.  Review of systems negative, except she's had a history of reflux esophagitis.  She's taking her Prevacid 30 mg b.i.d.    Current Allergies: ! PENICILLIN ! SULFA  Past Medical History:    Reviewed history from 04/13/2007 and no changes required:       GERD       Headache migraines       hx e dodosum   / strep and mono       staph infection right knee  1978   Social History:    Reviewed history from 07/24/2007 and no changes required:       Occupation:pharmacist       Never Smoked       Married       dogs in hh    Review of Systems      See HPI   Physical Exam  General:     Well-developed,well-nourished,in no acute distress; alert,appropriate and cooperative throughout examination Head:     Normocephalic and atraumatic without obvious abnormalities. No apparent alopecia or balding. Eyes:     No corneal or conjunctival inflammation noted. EOMI. Perrla. Funduscopic exam benign, without  hemorrhages, exudates or papilledema. Vision grossly normal. Ears:     External ear exam shows no significant lesions or deformities.  Otoscopic examination reveals clear canals, tympanic membranes are intact bilaterally without bulging, retraction, inflammation or discharge. Hearing is grossly normal bilaterally. Nose:     External nasal examination shows no deformity or inflammation. Nasal mucosa are pink and moist without lesions or exudates. Mouth:     Oral mucosa and oropharynx without lesions or exudates.  Teeth in good repair. Neck:     No deformities, masses, or tenderness noted. Lungs:     Normal respiratory effort, chest expands symmetrically. Lungs are clear to auscultation, no crackles or wheezes.    Impression & Recommendations:  Problem # 1:  COUGH (ICD-786.2) Assessment: Unchanged  Problem # 2:  OTALGIA (ICD-388.70) Assessment: Unchanged  Her updated medication list for this problem includes:    Zithromax 250 Mg Tabs (Azithromycin) .Marland Kitchen... 2 by mouth day 1 then 1 by mouth once daily for 4 days   Complete Medication List: 1)  Prevacid Solutab 30 Mg Tbdp (Lansoprazole) .Marland Kitchen.. 1 two times a  day as needed 2)  Imitrex 100 Mg Tabs (Sumatriptan succinate) 3)  Advil 200 Mg Caps (Ibuprofen) 4)  Flintstones Complete Chew (Pediatric multivit-minerals-c) 5)  Hycodan 5-1.5 Mg/75ml Syrp (Hydrocodone-homatropine) .Marland Kitchen.. 1-2 tsp by mouth q4-6 hours as needed cough 6)  Zithromax 250 Mg Tabs (Azithromycin) .... 2 by mouth day 1 then 1 by mouth once daily for 4 days 7)  Prednisone 20 Mg Tabs (Prednisone) .... Uad   Patient Instructions: 1)  take prednisone two tablets daily for 3 days, one a day for 3 days, a half a tablet a day for 3 days, then half a tablet every other day for two week taper.  Return p.r.n.    Prescriptions: PREDNISONE 20 MG  TABS (PREDNISONE) UAD  #40 x 0   Entered and Authorized by:   Roderick Pee MD   Signed by:   Roderick Pee MD on 08/02/2007   Method  used:   Electronically sent to ...       [facility]  [id number]*       [address]       [place], [place]  [address]       Ph: [phone] or [phone]       Fax: [phone]   RxID:   [id number]  ]

## 2010-06-24 NOTE — Assessment & Plan Note (Signed)
Summary: nausea/njr   Vital Signs:  Patient profile:   45 year old female Menstrual status:  regular Weight:      165.5 pounds Temp:     98.6 degrees F oral Pulse rate:   72 / minute BP sitting:   110 / 70  (left arm)  Vitals Entered By: Kyung Rudd, CMA (June 18, 2010 3:06 PM) CC: pt c/o nausea, fever, stomach cramps x 3days   Primary Care Provider:  Berniece Andreas, MD  CC:  pt c/o nausea, fever, and stomach cramps x 3days.  History of Present Illness:  patient presents to clinic as a work in for evaluation of nausea. states a three-day history of nausea without emesis. Initially had loose stools which resolved within 24 hours. Did have fever the first day with a MAXIMUM TEMPERATURE of 101 with no further fevers after 24 hours. Had brief abdominal cramping now resolved as well. Only persistent symptom has been mild nausea. Symptoms are not worse with food. Does have history of idiopathic pancreatitis status post MRCP and GI consult. no  further typical pancreatitis symptoms.  no alleviating or exacerbating factors  Current Medications (verified): 1)  Prevacid Solutab 30 Mg  Tbdp (Lansoprazole) .Marland Kitchen.. 1 Two Times A Day As Needed 2)  Imitrex 100 Mg  Tabs (Sumatriptan Succinate) .... Take 1 Tab As Needed For Headache, May Repeat in 2 Hours If Needed 3)  Advil 200 Mg  Caps (Ibuprofen) .... Take 3 Capsules By Mouth As Needed 4)  Flintstones Complete   Chew (Pediatric Multivit-Minerals-C) .... Take 1 Tablet By Mouth Once A Day  Allergies (verified): 1)  Penicillin 2)  Sulfa  Past History:  Past medical, surgical, family and social histories (including risk factors) reviewed for relevance to current acute and chronic problems.  Past Medical History: Reviewed history from 02/24/2010 and no changes required. GERD Headache migraines hx e dodosum   / strep and mono staph infection right knee  1978 Dislocated Patella back pain   hx of mri of back 2010   Consults Dr. Farris Has-  Ortho Dr. Gray Bernhardt Dr. Tomasita Morrow md   Past Surgical History: Reviewed history from 02/24/2010 and no changes required. Cholecystectomy Tonsillectomy COLONOSCOPY 3/10 NORMAL DR. Juanda Chance  Family History: Reviewed history from 02/18/2010 and no changes required. father died of cardiomyopathy 2023-04-08 Family History of Alcoholism/Addiction fa Family History Diabetes 1st degree relative mom Family History Lung cancer mom Family History of Arthritis fadied chf? 10/07 Maternal GF pancereatitis   not alcohol. Family History of Colon Cancer: Maternal Grandmother  Social History: Reviewed history from 02/24/2010 and no changes required. 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  Review of Systems General:  Complains of fever; denies chills. GI:  Complains of loss of appetite and nausea; denies abdominal pain, bloody stools, change in bowel habits, constipation, dark tarry stools, vomiting, vomiting blood, and yellowish skin color.  Physical Exam  General:  Well-developed,well-nourished,in no acute distress; alert,appropriate and cooperative throughout examination Head:  Normocephalic and atraumatic without obvious abnormalities. No apparent alopecia or balding. Abdomen:  Bowel sounds positive,abdomen soft and non-tender without masses, organomegaly or hernias noted.no distention.     Impression & Recommendations:  Problem # 1:  NAUSEA (ICD-787.02) Assessment New  atypical clinical presentation for pancreatitis. Suspect possible viral gastroenteritis. has had clinical improvement with resolution of all symptoms except nausea. follow bland diet and increase by mouth intake of fluid. Phenergan  when necessary for nausea or vomiting. follow closely if no improvement or worsening over the next several days.  Complete Medication List: 1)  Prevacid Solutab 30 Mg Tbdp (Lansoprazole)  .Marland Kitchen.. 1 two times a day as needed 2)  Imitrex 100 Mg Tabs (Sumatriptan succinate) .... Take 1 tab as needed for headache, may repeat in 2 hours if needed 3)  Advil 200 Mg Caps (Ibuprofen) .... Take 3 capsules by mouth as needed 4)  Flintstones Complete Chew (Pediatric multivit-minerals-c) .... Take 1 tablet by mouth once a day 5)  Promethazine Hcl 25 Mg Tabs (Promethazine hcl) .... One by mouth q4-6 hours as needed n/v Prescriptions: PROMETHAZINE HCL 25 MG TABS (PROMETHAZINE HCL) one by mouth q4-6 hours as needed n/v  #20 x 0   Entered and Authorized by:   Edwyna Perfect MD   Signed by:   Edwyna Perfect MD on 06/18/2010   Method used:   Electronically to        [facility] [phone]* (retail)       [address]       [place], [place]  [address]       Ph: [phone] or [phone]       Fax: [phone]   RxID:   [id number]    Orders Added: 1)  Est. Patient Level III [phone]

## 2010-06-24 NOTE — Progress Notes (Signed)
Summary: Talk to [facility]  Phone Note Call from Patient Call back at Home Phone [phone]   Call For: Mike Gip, PA Reason for Call: Talk to Nurse Complaint: Cough/Sore throat Summary of Call: Wants to advice Pam she got vmail that everything is normal. Does she need to keep f-up appt on 11-7? Can't get off from work. Wonders if everything is normal does she need to follow up? Initial call taken by: Leanor [facility],  March 08, 2010 11:44 AM  Follow-up for Phone Call        SINCE MRI NEGATIVE , SHE DOES NOT NEED TO COME BACK AT THIS TIME FOR FOLLOW UP. ANY RECURRENT SXS SHE NEEDS TO SEE DR. Juanda Chance. Follow-up by: Peterson Ao,  March 08, 2010 4:39 PM    Additional Follow-up for Phone Call Additional follow up Details #2::    Called pt and she is feeling much better.  She asked to cancel the appt for 03-29-10 for followup.  I did urger her to call us if she has recurrant symptoms. Follow-up by: Joselyn Glassman,  March 08, 2010 4:54 PM

## 2010-06-24 NOTE — Medication Information (Signed)
Summary: [facility]   Imported By: Sherian Rein 11/06/2009 11:01:37  _____________________________________________________________________  External Attachment:    Type:   Image     Comment:   External Document

## 2010-06-24 NOTE — Procedures (Signed)
Summary: Colonoscopy   Colonoscopy  Procedure date:  08/20/2008  Findings:      Location:  [facility].    Procedures Next Due Date:    Colonoscopy: 08/2018  COLONOSCOPY PROCEDURE REPORT  PATIENT:  Debra, [name]  MR#:  [id number] BIRTHDATE:   September 09, 1965, 42 yrs. old   GENDER:   female  ENDOSCOPIST:   Hedwig Morton. Juanda Chance, MD Referred by: Neta Mends. Panosh, M.D.  PROCEDURE DATE:  08/20/2008 PROCEDURE:  Higher-risk screening colonoscopy  colonoscopy via colostomy ASA CLASS:   Class I INDICATIONS: last colon 2003 was normal, Colon cancer in a maternal GM  IBS symptoms  MEDICATIONS:    Versed 10 mg, Fentanyl 75 mcg  DESCRIPTION OF PROCEDURE:   After the risks benefits and alternatives of the procedure were thoroughly explained, informed consent was obtained.  Digital rectal exam was performed and revealed no rectal masses.   The LB [id number] endoscope was introduced through the anus and advanced to the cecum, which was identified by both the appendix and ileocecal valve, without limitations.  The quality of the prep was good, using MoviPrep.  The instrument was then slowly withdrawn as the colon was fully examined. <<PROCEDUREIMAGES>>      <<OLD IMAGES>>  FINDINGS:  A normal appearing cecum, ileocecal valve, and appendiceal orifice were identified. The ascending, transverse, descending, sigmoid colon, and rectum appeared unremarkable. throughout the colon (see image1, image2, image4, and image5).   Retroflexed views in the rectum revealed no abnormalities.    The scope was then withdrawn from the patient and the procedure completed.  COMPLICATIONS:   None  ENDOSCOPIC IMPRESSION:  1) Normal colon throughout the colon  no polyps RECOMMENDATIONS:  1) hemoccults q 2-3 years  2) high fiber diet  REPEAT EXAM:   In 10 year(s) for.  7-10 years   _______________________________ Hedwig Morton. Juanda Chance, MD  CC: Berniece Andreas, MD

## 2010-06-24 NOTE — Assessment & Plan Note (Signed)
Summary: cough/dm   Vital Signs:  Patient Profile:   45 Years Old Female Weight:      164 pounds Temp:     98.4 degrees F oral Pulse rate:   78 / minute BP sitting:   100 / 62  (left arm) Cuff size:   regular  Vitals Entered By: Romualdo Bolk, CMA (June 03, 2008 2:58 PM)                 Chief Complaint:  Cough.  History of Present Illness: Debra [name] is here for a ongoing cough since before Thanksgiving. Pt states that she has been taking her prevacid two times a day and this hasn't helped any.  Onset like a  minor viral cold .    self treated for reflux incase related.     Otc  not that helpful. Best in am    Drainage  and no wheezing.   Not environmental .    Dry cough but feel throat congestion.     No current nasal signs.  no sob or change in exercise capacity  If has a fit gets a spasm.   Other interim hx  had a patellar disclocation at christmas .. in PT now.     doing  better.    Prior Medications Reviewed Using: Patient Recall  Updated Prior Medication List: PREVACID SOLUTAB 30 MG  TBDP (LANSOPRAZOLE) 1 two times a day as needed IMITREX 100 MG  TABS (SUMATRIPTAN SUCCINATE)  ADVIL 200 MG  CAPS (IBUPROFEN)  FLINTSTONES COMPLETE   CHEW (PEDIATRIC MULTIVIT-MINERALS-C)  HYCODAN 5-1.5 MG/5ML  SYRP (HYDROCODONE-HOMATROPINE) 1-2 tsp by mouth q4-6 hours as needed cough  Current Allergies (reviewed today): ! PENICILLIN ! SULFA  Past Medical History:    GERD    Headache migraines    hx e dodosum   / strep and mono    staph infection right knee  1978    Dislocated Patella        Consults    Dr. Farris Has- Ortho    Dr. Gray Bernhardt    Dr. Tomasita Morrow md    Social History:    Occupation:pharmacist    Never Smoked    Married    dogs in hh         Review of Systems  The patient denies anorexia, fever, weight loss, hoarseness, chest pain, hemoptysis, abdominal pain, abnormal bleeding, enlarged lymph nodes, and angioedema.     Physical  Exam  General:     alert, well-developed, well-nourished, and well-hydrated.   Head:     normocephalic and atraumatic.   Eyes:     vision grossly intact, pupils equal, and pupils round.   Ears:     R ear normal, L ear normal, and no external deformities.   Nose:     no external deformity, no external erythema, and no nasal discharge.  minimal congestion and no face tenderness Mouth:     pharynx pink and moist and no erythema.   Neck:     No deformities, masses, or tenderness noted. Lungs:     Normal respiratory effort, chest expands symmetrically. Lungs are clear to auscultation, no crackles or wheezes.no dullness.   Heart:     Normal rate and regular rhythm. S1 and S2 normal without gallop, murmur, click, rub or other extra sounds. Pulses:     intact  Extremities:     no cce  Neurologic:     grossly non focal Skin:     turgor normal,  color normal, no ecchymoses, and no petechiae.   Cervical Nodes:     No lymphadenopathy noted Psych:     Oriented X3, good eye contact, not anxious appearing, and not depressed appearing.      Impression & Recommendations:  Problem # 1:  COUGH (ICD-786.2) prolonged  poss post infectious r/o other pathology  not responsive to gerd rx also,   no systemic .sx   options dicussed  will get cx ray Orders: T-2 View CXR, Same Day (71020.5TC)   Complete Medication List: 1)  Prevacid Solutab 30 Mg Tbdp (Lansoprazole) .Marland Kitchen.. 1 two times a day as needed 2)  Imitrex 100 Mg Tabs (Sumatriptan succinate) 3)  Advil 200 Mg Caps (Ibuprofen) 4)  Flintstones Complete Chew (Pediatric multivit-minerals-c) 5)  Hycodan 5-1.5 Mg/71ml Syrp (Hydrocodone-homatropine) .Marland Kitchen.. 1-2 tsp by mouth q4-6 hours as needed cough   Patient Instructions: 1)  Get  cx ray  2)  try zyrtec and claritin.  3)  Will look into old pulmonary nodule on chest ct ..probably no follow up needed. 4)  If cxray ok we will start prednisone course .( will   e mail in)   Appended Document:  cough/dm    Clinical Lists Changes  Problems: Added new problem of NEED PROPHYLACTIC VACCINATION&INOCULATION FLU (ICD-V04.81) Orders: Added new Service order of H1N1 vaccine G code (W2956) - Signed Added new Service order of Influenza A (H1N1) adm  fee Medicare/Non Medicare [phone]) - Signed Observations: Added new observation of H1N1 #1 LOT: [id number] sp (06/06/2008 7:42) Added new observation of H1N1 #1 EXP: 09/19/2008 (06/06/2008 7:42) Added new observation of H1N1 BY: Romualdo Bolk, CMA (06/06/2008 7:42) Added new observation of H1N1 #1 RTE: IM (06/06/2008 7:42) Added new observation of H1N1 #1 DS: 0.5 ml (06/06/2008 7:42) Added new observation of H1N1 #1 SITE: right deltoid (06/06/2008 7:42) Added new observation of H1N1#1 VAC: H1N1 vaccine G code (06/06/2008 7:42)       Flu Vaccine Consent Questions    Do you have a history of severe allergic reactions to this vaccine? no    Any prior history of allergic reactions to egg and/or gelatin? no    Do you have a sensitivity to the preservative Thimersol? no    Do you have a past history of Guillan-Barre Syndrome? no    Do you currently have an acute febrile illness? no    Have you ever had a severe reaction to latex? no    Vaccine information given and explained to patient? yes    Are you currently pregnant? no  H1N1 # 1    Vaccine Type: H1N1 vaccine G code    Site: right deltoid    Mfr: novartis    Dose: 0.5 ml    Route: IM    Given by: Romualdo Bolk, CMA    Exp. Date: 09/19/2008    Lot #: [id number]

## 2010-06-24 NOTE — Assessment & Plan Note (Signed)
Summary: post ed follow up/ssc   Vital Signs:  Patient profile:   45 year old female Menstrual status:  regular LMP:     12/02/2009 Weight:      159 pounds Pulse rate:   66 / minute BP sitting:   110 / 70  (left arm) Cuff size:   regular  Vitals Entered By: Romualdo Bolk, CMA (AAMA) (December 21, 2009 1:23 PM) CC: Post Ed Follow up LMP (date): 12/02/2009 LMP - Character: normal Menarche (age onset years): 13   Menses interval (days): 26 Menstrual flow (days): 6 Enter LMP: 12/02/2009   History of Present Illness: Debra [name] comes in today  for  follow up of ED  12/18/2009  after episode of severe  chest shoulder and then abdominal pain epigastric.   and radiated to the back.    No vomiting but nausea     got  better in ed. Next day normal but  tired.   Never had this before . minimal episodic  alcohol. No new meds .  Has had GB removed and never had pancreatitis.  Has IBS but this is different.   No sig pain today and eating normally for her.   NO meds for this and got GI coctail in the ed.  Preventive Screening-Counseling & Management  Alcohol-Tobacco     Alcohol drinks/day: 0     Smoking Status: never     Passive Smoke Exposure: no  Caffeine-Diet-Exercise     Caffeine use/day: 3     Does Patient Exercise: yes     Type of exercise: spin classes, wt,walk     Exercise (avg: min/session): >60     Times/week: 3  Current Medications (verified): 1)  Prevacid Solutab 30 Mg  Tbdp (Lansoprazole) .Marland Kitchen.. 1 Two Times A Day As Needed 2)  Imitrex 100 Mg  Tabs (Sumatriptan Succinate) .... Take 1 Tab As Needed For Headache, May Repeat in 2 Hours If Needed 3)  Advil 200 Mg  Caps (Ibuprofen) 4)  Flintstones Complete   Chew (Pediatric Multivit-Minerals-C) 5)  Hydrocodone-Acetaminophen 5-325 Mg Tabs (Hydrocodone-Acetaminophen)  Allergies (verified): 1)  Penicillin 2)  Sulfa  Past History:  Past medical, surgical, family and social histories (including risk factors) reviewed,  and no changes noted (except as noted below).  Past Medical History: GERD Headache migraines hx e dodosum   / strep and mono staph infection right knee  1978 Dislocated Patella Ed visit abdominal pain and elevated lipase Consults Dr. Farris Has- Ortho Dr. Gray Bernhardt Dr. Tomasita Morrow md   Past Surgical History: Reviewed history from 03/13/2007 and no changes required. Cholecystectomy Tonsillectomy  Past History:  Care Management: OB/Gyn: Dr. Jennette Kettle Gastroenterology: Dr. Georgina Quint Orthopedics: Farris Has  Family History: Reviewed history from 04/13/2007 and no changes required. father died of cardiomyopathy 2023-04-22 Family History of Alcoholism/Addiction fa Family History Diabetes 1st degree relative mom Family History Lung cancer mom Family History of Arthritis fadied chf? 10/07  Maternal GF pancereatitis   not alcohol. f  Social History: Reviewed history from 06/03/2008 and no changes required. 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   GI:  See HPI; Denies bloody stools, change in bowel habits, dark tarry stools, diarrhea, hemorrhoids, vomiting, vomiting blood, and yellowish skin color.  Physical Exam  General:  Well-developed,well-nourished,in no acute distress; alert,appropriate and cooperative throughout examination Head:  normocephalic and atraumatic.  Eyes:  vision grossly intact, pupils equal, and pupils round.   Neck:  No deformities, masses, or tenderness noted. Lungs:  normal respiratory effort and no intercostal retractions.   Heart:  normal rate, regular rhythm, no murmur, and no gallop.   Abdomen:  soft, normal bowel sounds, no guarding, no rebound tenderness, no hepatomegaly, and no splenomegaly.  mild soresness epigastrum  no masses  no flank  pain Pulses:  nl cap refill  Extremities:  no clubbing cyanosis or edema  Neurologic:  grossly non focal  Skin:  turgor normal and color normal.   Cervical Nodes:  No lymphadenopathy noted Psych:  Oriented X3, normally interactive, good eye contact, not anxious appearing, and not depressed appearing.   obtained and reviewed  records    Impression & Recommendations:  Problem # 1:  ? of PANCREATITIS (ICD-577.0) lipase 3 x normal  and abd chest pain .    short lived ? cause .    hx of gallstones removed in past . no other sig risk.   Will get Dr Juanda Chance to see her  and    plan any imaging rec will repeat labs today . She is doing well today ...    Orders: T-Lipase (16109-60454) TLB-Amylase (82150-AMYL) TLB-Hepatic/Liver Function Pnl (80076-HEPATIC) Gastroenterology Referral (GI)  Problem # 2:  ABDOMINAL BLOATING (ICD-787.3) chronic  ? no change   Complete Medication List: 1)  Prevacid Solutab 30 Mg Tbdp (Lansoprazole) .Marland Kitchen.. 1 two times a day as needed 2)  Imitrex 100 Mg Tabs (Sumatriptan succinate) .... Take 1 tab as needed for headache, may repeat in 2 hours if needed 3)  Advil 200 Mg Caps (Ibuprofen) 4)  Flintstones Complete Chew (Pediatric multivit-minerals-c) 5)  Hydrocodone-acetaminophen 5-325 Mg Tabs (Hydrocodone-acetaminophen)  Patient Instructions: 1)  You will be informed of lab results when available.  2)  also will  Get Dr Juanda Chance to see you about further eval and advisability of imaging study such as  CT scan .

## 2010-06-24 NOTE — Progress Notes (Signed)
Summary: Update for Dr Fabian Sharp "still not well"  Phone Note Call from Patient Call back at Work Phone [phone]   Caller: Patient Call For: Panosh Summary of Call: Pt called to report her cough is "some better".  Pt reporting still some coughing, fatigued all day "still  not well".  Update for Dr Fabian Sharp Initial call taken by: Sid Falcon LPN,  August 01, 2007 2:54 PM  Follow-up for Phone Call        Spoke to pt's husband- she is going down hill. She was having pain in left side from jaw down to shoulder happened for then stop. Pt is also sitll having off back pain. Pt's husband stated that she wants to come in today. I worked her in with Dr. Tawanna Cooler and they know they will have to wait. Follow-up by: Romualdo Bolk, CMA,  August 02, 2007 11:46 AM

## 2010-06-24 NOTE — Progress Notes (Signed)
Summary: ADVICE  Phone Note Call from Patient Call back at [phone]   Caller: Patient Call For: DR [facility] Reason for Call: Talk to Nurse Summary of Call: WANTS SHANNON TO RETURN CALL. WANTS TO BE SEEN. HAS UPPER BACK PAIN. Initial call taken by: Warnell Forester,  March 12, 2007 1:09 PM  Follow-up for Phone Call        Spoke to pt-She is having Rt sided upper back, tingling, like skin is crawling x 3 weeks. She is not in any pain. Follow-up by: Romualdo Bolk, CMA,  March 12, 2007 1:35 PM  Additional Follow-up for Phone Call Additional follow up Details #1::        Per md-? pinched nerve ov. Appointment scheduled for 10/21 at 10am. Left message to call back and confirm appointment. Additional Follow-up by: Romualdo Bolk, CMA,  March 12, 2007 2:02 PM

## 2010-06-24 NOTE — Progress Notes (Signed)
Summary: stomach   Phone Note Call from Patient Call back at Work Phone [phone]   Summary of Call: Bacterial infection guardinella.  Bleeding & week early.  Stomach bothering her ribcage down.  Yeast workup & Diflucan x2 doses & scope showed bacterial infection guarinella & gave Flagyl.  Urine was dark brown, side effect of flagyl.  Called back when she started period a week early, they said UTI, called in Macrodantin.  Today only taken Macrodantin.  Not taken temp.  Feels swollen & is some swollen.  Urinating & having BMs that are soft & smaller.  Taking liquids.  Some nausea.  No vomiting.  Advised her to call OB GYN & she said she will.  She would like to hear from Ambermarie Honeyman/Dr. P. at w [phone].     Initial call taken by: Rudy Jew, RN,  Oct 07, 2009 3:46 PM  Follow-up for Phone Call        see if her GYNE can send [place] a copy of evaluation   for above   so  i can review.  Follow-up by: Madelin Headings MD,  Oct 08, 2009 9:25 AM  Additional Follow-up for Phone Call Additional follow up Details #1::        Lft msg to call us about faxing Gyn report Additional Follow-up by: Kathrynn Speed CMA,  Oct 08, 2009 9:58 AM    Additional Follow-up for Phone Call Additional follow up Details #2::    Left message to call back. Follow-up by: Romualdo Bolk, CMA Duncan Dull),  Oct 13, 2009 11:25 AM  Additional Follow-up for Phone Call Additional follow up Details #3:: Details for Additional Follow-up Action Taken: I called pt on her cell at [phone] and left a message for pt to fax [place] the notes from her Gyn before her appt.  Additional Follow-up by: Romualdo Bolk, CMA (AAMA),  Oct 13, 2009 3:02 PM

## 2010-06-24 NOTE — Assessment & Plan Note (Signed)
Summary: hep b inj/njr  Nurse Visit    Prior Medications: PREVACID SOLUTAB 30 MG  TBDP (LANSOPRAZOLE) 1 two times a day as needed IMITREX 100 MG  TABS (SUMATRIPTAN SUCCINATE)  ADVIL 200 MG  CAPS (IBUPROFEN)  FLINTSTONES COMPLETE   CHEW (PEDIATRIC MULTIVIT-MINERALS-C)  HYCODAN 5-1.5 MG/5ML  SYRP (HYDROCODONE-HOMATROPINE) 1-2 tsp by mouth q4-6 hours as needed cough ZITHROMAX 250 MG  TABS (AZITHROMYCIN) 2 by mouth day 1 then 1 by mouth once daily for 4 days PREDNISONE 20 MG  TABS (PREDNISONE) UAD Current Allergies: ! PENICILLIN ! SULFA   Hepatitis B Vaccine # 2    Vaccine Type: HepB Adult    Site: left deltoid    Mfr: GlaxoSmithKline    Dose: 1.0 ml    Route: IM    Given by: Romualdo Bolk, CMA    Exp. Date: 04/30/2010    Lot #: [id number]   Orders Added: 1)  Hepatitis B Vaccine >6yrs [90746] 2)  Admin 1st Vaccine [email or web address]    ]

## 2010-06-24 NOTE — Miscellaneous (Signed)
Summary: Flu Vaccination/[facility]  Flu Vaccination/[facility]   Imported By: Maryln Gottron 03/30/2009 14:19:26  _____________________________________________________________________  External Attachment:    Type:   Image     Comment:   External Document

## 2010-06-24 NOTE — Assessment & Plan Note (Signed)
Summary: pink eye?mhf   Vital Signs:  Patient Profile:   45 Years Old Female Weight:      157 pounds Temp:     99.0 degrees F oral Pulse rate:   78 / minute BP sitting:   120 / 80  (left arm) Cuff size:   regular  Vitals Entered By: Romualdo Bolk, CMA (June 25, 2007 2:19 PM)                 Chief Complaint:  Pink eye.  History of Present Illness: Debra [name] is here for pink eye? . Pt stated that left eye started watering on 06/23/07 and then on 06/24/07 it was matted shut. Then today her right eye started with the same thing.  Took out contact . No photophobia or vision loss fever.  Uri symptom continue   see last ov.    No ear or face pain now.  Current Allergies (reviewed today): ! PENICILLIN ! SULFA  Past Medical History:    Reviewed history from 04/13/2007 and no changes required:       GERD       Headache migraines       hx e dodosum   / strep and mono       staph infection right knee  1978   Social History:    Reviewed history from 03/13/2007 and no changes required:       Occupation:pharmacist       Never Smoked    Review of Systems  The patient denies anorexia and fever.         no rash   Physical Exam  General:     alert, well-developed, well-nourished, and well-hydrated.  with red eyes Head:     normocephalic and atraumatic.   Eyes:     vision grossly intact, pupils equal, pupils round, and pupils reactive to light.  yellow d.c  cornia seems clear Ears:     R ear normal, L ear normal, and no external deformities.   Nose:     nasal congestion no face pain Neck:     No deformities, masses, or tenderness noted. Skin:     turgor normal and color normal.   Cervical Nodes:     no anterior cervical adenopathy and no posterior cervical adenopathy.      Impression & Recommendations:  Problem # 1:  CONJUNCTIVITIS (ICD-372.30) prurulent at present    Her updated medication list for this problem includes:    Polytrim  10000-0.1 Unit/ml-% Soln (Polymyxin b-trimethoprim) .Marland Kitchen... 1-2 qtts ou q4hours while awake Discussed treatment, and urged patient to wash hands carefully after touching face.   Problem # 2:  URI (ICD-465.9) viral as above   continue symptom rx  Her updated medication list for this problem includes:    Advil 200 Mg Caps (Ibuprofen)   Complete Medication List: 1)  Prevacid Solutab 30 Mg Tbdp (Lansoprazole) .Marland Kitchen.. 1 two times a day as needed 2)  Imitrex 100 Mg Tabs (Sumatriptan succinate) 3)  Advil 200 Mg Caps (Ibuprofen) 4)  Polytrim 10000-0.1 Unit/ml-% Soln (Polymyxin b-trimethoprim) .Marland Kitchen.. 1-2 qtts ou q4hours while awake     Prescriptions: POLYTRIM 10000-0.1 UNIT/ML-%  SOLN (POLYMYXIN B-TRIMETHOPRIM) 1-2 qtts ou q4hours while awake  #1 bottle x 0   Entered and Authorized by:   Madelin Headings MD   Signed by:   Madelin Headings MD on 06/25/2007   Method used:   Print then Give to Patient   RxID:  [id number]  ] 

## 2010-06-24 NOTE — Progress Notes (Signed)
Summary: Pt is having weakness and tingling in her legs  Phone Note Call from Patient Call back at Work Phone [phone] Call back at [phone]   Caller: Patient Summary of Call: Pt calling saying that her legs are tingling and weak. 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 Juanda Chance. Left message for pt to call back. Pt is still working.  Initial call taken by: Romualdo Bolk, CMA Duncan Dull),  January 04, 2010 1:29 PM  Follow-up for Phone Call        Spoke with pt and she is having some tingling and cramping in from the knee's down. This has been going with the cramping for over a 2-3 weeks. No other symptoms besides the nausea and ha's that has been going on prior to this. Offered pt and appt today but pt states that she can't get off work today or tomorrow. Follow-up by: Romualdo Bolk, CMA Duncan Dull),  January 04, 2010 2:21 PM  Additional Follow-up for Phone Call Additional follow up Details #1::        she needs to be seen for this. We can work her in or she can see Dr. Fabian Sharp later this week. Additional Follow-up by: Nelwyn Salisbury MD,  January 04, 2010 4:41 PM    Additional Follow-up for Phone Call Additional follow up Details #2::    Appt made for wed. Pt couldn't come in soonier due to work.  Follow-up by: Romualdo Bolk, CMA Duncan Dull),  January 04, 2010 4:49 PM

## 2010-06-24 NOTE — Procedures (Signed)
Summary: COLON (Dr Blossom Hoops)   Colonoscopy  Procedure date:  12/06/2001  Findings:      Location:  [facility].    Procedures Next Due Date:    Colonoscopy: 12/2006 Patient Name: Debra [name], Debra [name] MRN: [id number] Procedure Procedures: Colonoscopy CPT: 40981.  Personnel: Endoscopist: Ulyess Mort, MD.  Referred By: Barbera Setters Artis Flock, MD.  Exam Location: Exam performed in Outpatient Clinic. Outpatient  Patient Consent: Procedure, Alternatives, Risks and Benefits discussed, consent obtained, from patient. Consent was obtained by the RN.  Indications Symptoms: Hematochezia. Abdominal pain / bloating. Change in bowel habits.  Increased Risk Screening: For family history of colorectal neoplasia, in   History  Pre-Exam Physical: Performed Dec 06, 2001. Rectal exam, Abdominal exam, Extremity exam, Mental status exam WNL.  Exam Exam: Extent of exam reached: Cecum, extent intended: Cecum.  Colon retroflexion performed. Images taken. ASA Classification: I. Tolerance: good.  Monitoring: Pulse and BP monitoring, Oximetry used. Supplemental O2 given.  Colon Prep Prep results: good.  Sedation Meds: Patient assessed and found to be appropriate for moderate (conscious) sedation. Fentanyl 100 mcg. given IV. Versed 7 mg. given IV.  Findings - NOT SEEN ON EXAM: Cecum to Rectum. Polyps, AVM's, Colitis, Tumors, Melanosis, Crohn's, Diverticulosis, Comments: bx,s =4.  - HEMORRHOIDS: External. Size: Grade II. ICD9: Hemorrhoids, External: 455.3.   Assessment Abnormal examination, see findings above.  Diagnoses: 455.3: Hemorrhoids, External.   Events  Unplanned Interventions: No intervention was required.  Unplanned Events: There were no complications. Plans Medication Plan: Continue current medications. Hemorrhoidal Medications:  Fiber supplements:   Patient Education: Patient given standard instructions for: Hemorrhoids. Yearly hemoccult testing  recommended. Patient instructed to get routine colonoscopy every 5 years. probable IBS.  Disposition: After procedure patient sent to recovery. After recovery patient sent home.   This report was created from the original endoscopy report, which was reviewed and signed by the above listed endoscopist.

## 2010-06-24 NOTE — Letter (Signed)
Summary: [facility]   Imported By: Sherian Rein 11/06/2009 11:02:41  _____________________________________________________________________  External Attachment:    Type:   Image     Comment:   External Document

## 2010-06-24 NOTE — Miscellaneous (Signed)
Summary: flu vax  Clinical Lists Changes  Observations: Added new observation of FLU VAX: Historical (03/25/2009 12:01)      Immunization History:  Influenza Immunization History:    Influenza:  historical (03/25/2009)

## 2010-06-24 NOTE — Progress Notes (Signed)
Summary: refill on prevacid  Phone Note From Pharmacy   Caller: [facility]  [id number]* Reason for Call: Needs renewal Details for Reason: Prevacid Initial call taken by: Romualdo Bolk, CMA,  July 27, 2007 11:00 AM  Follow-up for Phone Call        [facility] to do. Sent thru EMR. Follow-up by: Romualdo Bolk, CMA,  July 27, 2007 11:01 AM      Prescriptions: PREVACID SOLUTAB 30 MG  TBDP (LANSOPRAZOLE) 1 two times a day as needed  #60 x 5   Entered by:   Romualdo Bolk, CMA   Authorized by:   Madelin Headings MD   Signed by:   Romualdo Bolk, CMA on 07/27/2007   Method used:   Electronically sent to ...       [facility]  [id number]*       [address]       [place], [place]  [address]       Ph: [phone] or [phone]       Fax: [phone]   RxID:   [id number]

## 2010-07-01 ENCOUNTER — Encounter: Payer: Self-pay | Admitting: Internal Medicine

## 2010-07-02 ENCOUNTER — Ambulatory Visit (INDEPENDENT_AMBULATORY_CARE_PROVIDER_SITE_OTHER): Payer: BC Managed Care – PPO | Admitting: Internal Medicine

## 2010-07-02 ENCOUNTER — Encounter: Payer: Self-pay | Admitting: Internal Medicine

## 2010-07-02 DIAGNOSIS — J984 Other disorders of lung: Secondary | ICD-10-CM

## 2010-07-02 DIAGNOSIS — R05 Cough: Secondary | ICD-10-CM

## 2010-07-02 DIAGNOSIS — R059 Cough, unspecified: Secondary | ICD-10-CM

## 2010-07-02 DIAGNOSIS — R911 Solitary pulmonary nodule: Secondary | ICD-10-CM | POA: Insufficient documentation

## 2010-07-02 DIAGNOSIS — R079 Chest pain, unspecified: Secondary | ICD-10-CM | POA: Insufficient documentation

## 2010-07-02 MED ORDER — DEXLANSOPRAZOLE 60 MG PO CPDR: 60.0000 mg | DELAYED_RELEASE_CAPSULE | Freq: Every day | ORAL | Status: DC

## 2010-07-02 NOTE — Assessment & Plan Note (Addendum)
History of same in the past but now more severe frequent and radiating to the back and left arm. Has never had a cardiac workup is planning pregnancy and infertility treatment would recommend cardiology opinion about any potential further workup and definitely change her PPI   dexilant and get back with GI. Reviewed her past chest x-rays reports today no acute problems.   EKG shows no acute changes today.

## 2010-07-02 NOTE — Progress Notes (Signed)
  Subjective:    Patient ID: Debra [name], female    DOB: 1966/04/08, 45 y.o.   MRN: [id number]  HPI  patient comes in today because ofConcern about recent occurrence of 1-2 weeks of  Mid chest pain radiating to back  6/10 Dull.  Comes and goes stays for 20-30 minutes   Usually radiates.  worse when laying down and   often wakes her at night  Some at  Night   . Not really assoicated with exercise  .   No significant nausea vomiting or diarrhea associated with this. Although she has had this problem in the past. She's been taking Prevacid once a day  A radically but more consistently since she got the pain.    She has had a diagnosis of GERD but has never had an endoscopy to our knowledge. She has had a colonoscopy  father died of cardiomyopathy in Oct 10, 2005 mom had a history of lung cancer. Review of Systems Recent coughing   Poss not related. Nsaid ibu for HAs about 2 x per week.  No dyphagia.   ? X pf lung nodule.  No fever weight loss bleeding. Couple is undergoing fertility treatment husband recently diagnosed with   sterility she will getAI    Objective:   Physical Exam  well-developed well-nourished in no acute distress HEENT grossly normal neck supple without masses thyromegaly or bruit chest CTA BX equal no chest wall deformity cardiac S1-S2 no gallops murmurs or rubs. Abdomen soft without organomegaly guarding or rebound no hernia seen.   Extremities negative CCE  Psych: appears oriented without significant anxiety or depression.      EKG shows sinus rhythm rate 69 no acute changes. No ST elevation.  Assessment & Plan:    Chest pain.   recurrent and more severe mid chest radiating to back worse when lying down waxing and waning. This is probably esophageal in etiology however because of her age and undergoing fertility treatment would get cardiology opinion in the meantime.

## 2010-07-02 NOTE — Assessment & Plan Note (Signed)
Dry intermittent cough possibly post viral unclear cause seems not related to her chest pain at present. No shortness of breath currently.

## 2010-07-02 NOTE — Patient Instructions (Signed)
This acts like poss esophageal issue  /.. Change med . Get cards opinion for reason said .   And GI    Also Call in the meantime if fever worsening

## 2010-07-20 ENCOUNTER — Encounter (INDEPENDENT_AMBULATORY_CARE_PROVIDER_SITE_OTHER): Payer: Self-pay | Admitting: *Deleted

## 2010-07-20 DIAGNOSIS — R0789 Other chest pain: Secondary | ICD-10-CM | POA: Insufficient documentation

## 2010-07-21 ENCOUNTER — Encounter: Payer: Self-pay | Admitting: Cardiovascular Disease

## 2010-07-21 ENCOUNTER — Institutional Professional Consult (permissible substitution) (INDEPENDENT_AMBULATORY_CARE_PROVIDER_SITE_OTHER): Payer: BC Managed Care – PPO | Admitting: Cardiovascular Disease

## 2010-07-21 DIAGNOSIS — R072 Precordial pain: Secondary | ICD-10-CM

## 2010-07-29 NOTE — Letter (Signed)
Summary: New Patient letter  [facility]  [address] [place], [place] [address]   Phone: [phone]  Fax: [phone]       07/20/2010 MRN: [id number]  Debra [name] [address] [place], [place]  [address]  [place]  Dear Debra [name],  Welcome to the Gastroenterology Division at [facility].    You are scheduled to see Dr.  Juanda Chance on 09-02-10 at 2:45P.M. on the 3rd floor at [facility], [address]  We ask that you try to arrive at our office 15 minutes prior to your appointment time to allow for check-in.  We would like you to complete the enclosed self-administered evaluation form prior to your visit and bring it with you on the day of your appointment.  We will review it with you.  Also, please bring a complete list of all your medications or, if you prefer, bring the medication bottles and we will list them.  Please bring your insurance card so that we may make a copy of it.  If your insurance requires a referral to see a specialist, please bring your referral form from your primary care physician.  Co-payments are due at the time of your visit and may be paid by cash, check or credit card.     Your office visit will consist of a consult with your physician (includes a physical exam), any laboratory testing he/she may order, scheduling of any necessary diagnostic testing (e.g. x-ray, ultrasound, CT-scan), and scheduling of a procedure (e.g. Endoscopy, Colonoscopy) if required.  Please allow enough time on your schedule to allow for any/all of these possibilities.    If you cannot keep your appointment, please call [phone] to cancel or reschedule prior to your appointment date.  This allows [place] the opportunity to schedule an appointment for another patient in need of care.  If you do not cancel or reschedule by 5 p.m. the business day prior to your appointment date, you will be charged a $50.00 late cancellation/no-show fee.    Thank you for  choosing [facility] for your medical needs.  We appreciate the opportunity to care for you.  Please visit [place] at our website  to learn more about our practice.                     Sincerely,                                                             The Gastroenterology Division

## 2010-07-29 NOTE — Assessment & Plan Note (Signed)
Summary: consult- chestpain. per terri office [phone]. gd   Referring Provider:  Berniece Andreas, MD Primary Provider:  Berniece Andreas, MD  CC:  pt complains of chest pain.  History of Present Illness: 45 yo  atypical SSCP.  Wants to pursue further fertility Rx.  Worried about heart.  Pain last 2-3 months.  Non exertional. Does Zumba and spinning without issues.  Can be exacerbated at night in recumbancy.  No other associated GI issues.  Been on Dexilant but pain persists and radiates to back.  No history of murmur, palpitaitons, dypnea, syncope or edema.    Current Problems (verified): 1)  Chest Pain  (ICD-786.50) 2)  Family Hx Colon Cancer  (ICD-V16.0) 3)  Fh of Colon Cancer  (ICD-153.9) 4)  External Hemorrhoids  (ICD-455.3) 5)  Esophagitis, Hx of  (ICD-V12.79) 6)  Pancreatitis, Acute  (ICD-577.0) 7)  Abdominal Pain, Epigastric  (ICD-789.06) 8)  Varicose Vein  (ICD-456.8) 9)  Cramp of Limb  (ICD-729.82) 10)  Liver Function Tests, Abnormal  (ICD-794.8) 11)  ? of Pancreatitis  (ICD-577.0) 12)  Diarrhea  (ICD-787.91) 13)  Hip Pain, Right  (ICD-719.45) 14)  Back Pain  (ICD-724.5) 15)  Abdominal Bloating  (ICD-787.3) 16)  Other Symptoms Involving Digestive System Other  (AVW-098.11) 17)  Low Back Pain, Mild  (ICD-724.2) 18)  Need Prophylactic Vaccination&inoculation Flu  (ICD-V04.81) 19)  Cough  (ICD-786.2) 20)  Uri  (ICD-465.9) 21)  Otalgia  (ICD-388.70) 22)  Acute Pharyngitis  (ICD-462) 23)  Family History Diabetes 1st Degree Relative  (ICD-V18.0) 24)  Family History of Alcoholism/addiction  (ICD-V61.41) 25)  Fatigue  (ICD-780.79) 26)  Urinary Frequency  (ICD-788.41) 27)  Paresthesia  (ICD-782.0) 28)  Headache  (ICD-784.0) 29)  Gerd  (ICD-530.81)  Current Medications (verified): 1)  Imitrex 100 Mg  Tabs (Sumatriptan Succinate) .... Take 1 Tab As Needed For Headache, May Repeat in 2 Hours If Needed 2)  Advil 200 Mg  Caps (Ibuprofen) .... Take 3 Capsules By Mouth As Needed 3)   Flintstones Complete   Chew (Pediatric Multivit-Minerals-C) .... Take 1 Tablet By Mouth Once A Day 4)  Dexilant 60 Mg Cpdr (Dexlansoprazole) .Marland Kitchen.. 1  Tab By Mouth Once Daily 5)  Ibuprofen 200 Mg Tabs (Ibuprofen) .... As Needed  Allergies (verified): 1)  Penicillin 2)  Sulfa  Past History:  Past Medical History: Last updated: 07/20/2010 CHEST PAIN  FAMILY HX COLON CANCER Family Hx of COLON CANCER  EXTERNAL HEMORRHOIDS  ESOPHAGITIS, HX OF  PANCREATITIS, ACUTE  ABDOMINAL PAIN, EPIGASTRIC  VARICOSE VEIN  CRAMP OF LIMB LIVER FUNCTION TESTS, ABNORMAL ? of PANCREATITIS  DIARRHEA  HIP PAIN, RIGHT ABDOMINAL BLOATING OTHER SYMPTOMS INVOLVING DIGESTIVE SYSTEM OTHER  NEED PROPHYLACTIC VACCINATION&INOCULATION FLU COUGH  URI  OTALGIA  ACUTE PHARYNGITIS FATIGUE URINARY FREQUENCY  PARESTHESIA HEADACHE GERD  hx e dodosum   / strep and mono staph infection right knee  1978 Dislocated Patella back pain   hx of mri of back 2010  Consults Dr. Farris Has- Ortho Dr. Gray Bernhardt Dr. Tomasita Morrow md   Past Surgical History: Last updated: 02/24/2010 Cholecystectomy Tonsillectomy COLONOSCOPY 3/10 NORMAL DR. Juanda Chance  Family History: Last updated: 2010/02/27 father died of cardiomyopathy 04-08-2023 Family History of Alcoholism/Addiction fa Family History Diabetes 1st degree relative mom Family History Lung cancer mom Family History of Arthritis fadied chf? 10/07 Maternal GF pancereatitis   not alcohol. Family History of Colon Cancer: Maternal Grandmother  Social History: Last updated: 02/24/2010 Occupation:Pharmacist  Never Smoked Married dogs in hh  Alcohol Use - yes-2 glasses  per month Illicit Drug Use - no Patient gets regular exercise.  Review of Systems       Denies fever, malais, weight loss, blurry vision, decreased visual acuity, cough, sputum, SOB, hemoptysis, pleuritic pain, palpitaitons, heartburn, abdominal pain, melena, lower extremity edema, claudication, or  rash.   Vital Signs:  Patient profile:   45 year old female Menstrual status:  regular Height:      67 inches Weight:      166 pounds BMI:     26.09 Pulse rate:   74 / minute Resp:     12 per minute BP sitting:   100 / 62  (right arm)  Vitals Entered By: Kem Parkinson (July 21, 2010 9:45 AM)  Physical Exam  General:  Affect appropriate Healthy:  appears stated age HEENT: normal Neck supple with no adenopathy JVP normal no bruits no thyromegaly Lungs clear with no wheezing and good diaphragmatic motion Heart:  S1/S2 no murmur,rub, gallop or click PMI normal Abdomen: benighn, BS positve, no tenderness, no AAA no bruit.  No HSM or HJR Distal pulses intact with no bruits No edema Neuro non-focal Skin warm and dry    Impression & Recommendations:  Problem # 1:  CHEST PAIN (ICD-786.50) Stress echo as needed F/U Orders: EKG w/ Interpretation (93000) Stress Echo (Stress Echo)  Patient Instructions: 1)  Your physician recommends that you schedule a follow-up appointment as needed. 2)  Your physician recommends that you continue on your current medications as directed. Please refer to the Current Medication list given to you today. 3)  Your physician has requested that you have a stress echocardiogram. For further information please visit [email or web address]  Please follow instruction sheet as given.   EKG Report  Procedure date:  07/21/2010  Findings:      NSR Normal ECG Rate 68

## 2010-08-04 LAB — CREATININE, SERUM
Creatinine, Ser: 0.77 mg/dL (ref 0.4–1.2)
GFR calc Af Amer: >60 mL/min (ref >60)
GFR calc non Af Amer: >60 mL/min (ref >60)

## 2010-08-04 LAB — BUN: BUN: 12 mg/dL (ref 6–23)

## 2010-08-07 LAB — DIFFERENTIAL
Basophils Absolute: 0.1 10*3/uL (ref 0.0–0.1)
Basophils Relative: 1 % (ref 0–1)
Eosinophils Absolute: 0.2 10*3/uL (ref 0.0–0.7)
Eosinophils Relative: 2 % (ref 0–5)
Lymphocytes Relative: 31 % (ref 12–46)
Lymphs Abs: 2.5 10*3/uL (ref 0.7–4.0)
Monocytes Absolute: 0.6 10*3/uL (ref 0.1–1.0)
Monocytes Relative: 8 % (ref 3–12)
Neutro Abs: 4.6 10*3/uL (ref 1.7–7.7)
Neutrophils Relative %: 58 % (ref 43–77)

## 2010-08-07 LAB — URINALYSIS, ROUTINE W REFLEX MICROSCOPIC
Bilirubin Urine: NEGATIVE
Glucose, UA: NEGATIVE mg/dL
Hgb urine dipstick: NEGATIVE
Ketones, ur: NEGATIVE mg/dL
Nitrite: NEGATIVE
Protein, ur: NEGATIVE mg/dL
Specific Gravity, Urine: 1.014 (ref 1.005–1.030)
Urobilinogen, UA: 0.2 mg/dL (ref 0.0–1.0)
pH: 6 (ref 5.0–8.0)

## 2010-08-07 LAB — COMPREHENSIVE METABOLIC PANEL
ALT: 24 U/L (ref 0–35)
AST: 40 U/L — ABNORMAL HIGH (ref 0–37)
Albumin: 4 g/dL (ref 3.5–5.2)
Alkaline Phosphatase: 43 U/L (ref 39–117)
BUN: 10 mg/dL (ref 6–23)
CO2: 22 mEq/L (ref 19–32)
Calcium: 8.9 mg/dL (ref 8.4–10.5)
Chloride: 106 mEq/L (ref 96–112)
Creatinine, Ser: 0.78 mg/dL (ref 0.4–1.2)
GFR calc Af Amer: >60 mL/min (ref >60)
GFR calc non Af Amer: >60 mL/min (ref >60)
Glucose, Bld: 98 mg/dL (ref 70–99)
Potassium: 3.9 mEq/L (ref 3.5–5.1)
Sodium: 135 mEq/L (ref 135–145)
Total Bilirubin: 0.6 mg/dL (ref 0.3–1.2)
Total Protein: 7.3 g/dL (ref 6.0–8.3)

## 2010-08-07 LAB — CBC
HCT: 41.5 % (ref 36.0–46.0)
Hemoglobin: 14.1 g/dL (ref 12.0–15.0)
MCH: 32.7 pg (ref 26.0–34.0)
MCHC: 34.1 g/dL (ref 30.0–36.0)
MCV: 95.9 fL (ref 78.0–100.0)
Platelets: 213 10*3/uL (ref 150–400)
RBC: 4.33 MIL/uL (ref 3.87–5.11)
RDW: 13.1 % (ref 11.5–15.5)
WBC: 7.9 10*3/uL (ref 4.0–10.5)

## 2010-08-07 LAB — LIPASE, BLOOD: Lipase: 243 U/L — ABNORMAL HIGH (ref 11–59)

## 2010-08-07 LAB — URINE MICROSCOPIC-ADD ON

## 2010-08-09 ENCOUNTER — Telehealth (INDEPENDENT_AMBULATORY_CARE_PROVIDER_SITE_OTHER): Payer: Self-pay | Admitting: *Deleted

## 2010-08-10 ENCOUNTER — Ambulatory Visit (HOSPITAL_COMMUNITY): Payer: BC Managed Care – PPO | Attending: Cardiovascular Disease | Admitting: Radiology

## 2010-08-10 DIAGNOSIS — R002 Palpitations: Secondary | ICD-10-CM | POA: Insufficient documentation

## 2010-08-10 DIAGNOSIS — R42 Dizziness and giddiness: Secondary | ICD-10-CM | POA: Insufficient documentation

## 2010-08-10 DIAGNOSIS — R5381 Other malaise: Secondary | ICD-10-CM | POA: Insufficient documentation

## 2010-08-10 DIAGNOSIS — R5383 Other fatigue: Secondary | ICD-10-CM | POA: Insufficient documentation

## 2010-08-10 DIAGNOSIS — R079 Chest pain, unspecified: Secondary | ICD-10-CM

## 2010-08-10 DIAGNOSIS — R072 Precordial pain: Secondary | ICD-10-CM | POA: Insufficient documentation

## 2010-08-16 ENCOUNTER — Telehealth: Payer: Self-pay | Admitting: Cardiovascular Disease

## 2010-08-16 NOTE — Telephone Encounter (Signed)
pt aware of results  

## 2010-08-16 NOTE — Telephone Encounter (Signed)
Pt calling for stress test results °

## 2010-08-19 NOTE — Progress Notes (Signed)
Summary: nuc pre procedure  Phone Note Outgoing Call Call back at Home Phone [phone]   Call placed by: Cathlyn Parsons RN,  August 09, 2010 10:38 AM Call placed to: Patient Reason for Call: Confirm/change Appt Summary of Call: Left message with information on [employer] Information Sheet (see scanned document for details).

## 2010-09-02 ENCOUNTER — Encounter: Payer: Self-pay | Admitting: Internal Medicine

## 2010-09-02 ENCOUNTER — Ambulatory Visit (INDEPENDENT_AMBULATORY_CARE_PROVIDER_SITE_OTHER): Payer: BC Managed Care – PPO | Admitting: Internal Medicine

## 2010-09-02 DIAGNOSIS — R079 Chest pain, unspecified: Secondary | ICD-10-CM

## 2010-09-02 DIAGNOSIS — K219 Gastro-esophageal reflux disease without esophagitis: Secondary | ICD-10-CM

## 2010-09-02 NOTE — Progress Notes (Signed)
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 Cardiac causes have been ruled out by a negative stress test. We have seen her in the past for colorectal screening. Her maternal grandmother had colon cancer. A colonoscopy in March 2010 and previously in 2003 was normal. She had an episode of acute pancreatitis in July 2011 and was found to have positive IgG antigliadin  antibodies and a positive ANA titer 1 :80. Her MRCP was normal. She has been having gastroesophageal reflux postprandially as well as a nocturnal cough. However, there has been no dysphagia or odynophagia. She tried proton pump inhibitors in the past; most recently Dexilant 60 mg daily. She takes Motrin 600 mg for headaches twice a week. Her weight has been stable. She does not smoke.   Past Medical History  Diagnosis Date  . EXTERNAL HEMORRHOIDS 02/18/2010  . VARICOSE VEIN 01/06/2010  . GERD 03/13/2007  . HIP PAIN, RIGHT 02/23/2009  . LOW BACK PAIN, MILD 08/06/2008  . FATIGUE 04/13/2007  . PARESTHESIA 03/13/2007  . Headache 03/13/2007  . Abdominal pain, epigastric 01/06/2010  . LIVER FUNCTION TESTS, ABNORMAL 01/06/2010  . ESOPHAGITIS, HX OF 02/18/2010  . Pulmonary nodule     incidental on ct 2007  neg cxray 2011  . Pancreatitis 12/18/09  . Chronic headaches   . Esophagitis   . Gallstones    Past Surgical History  Procedure Date  . Cholecystectomy   . Tonsillectomy     reports that she has never smoked. She has never used smokeless tobacco. She reports that she drinks alcohol. She reports that she does not use illicit drugs. family history includes Alcohol abuse in her father; Anemia in her father; Cardiomyopathy in her father; Colon cancer in her maternal grandmother; Diabetes in her mother; Heart disease in her father; Lung cancer in her mother; Other in her brother and father; and Pancreatitis in her maternal  grandfather. Allergies  Allergen Reactions  . Gadolinium      Code: HIVES, Desc: Multihance--pt had hives, Onset Date: [id number] IVP Dye  . Penicillins     REACTION: rash  . Sulfonamide Derivatives     REACTION: rash        Review of Systems: Denies odynophagia or dysphagia. Denies shortness of breath. Patient is status post cholecystectomy in 1995. Chronic headaches, fatigue, cough and skin rash.  The remainder of the 10  point ROS is negative except as outlined in H&P   Physical Exam: General appearance  Well developed, in no distress. Eyes- non icteric. HEENT nontraumatic, normocephalic. Mouth no lesions, tongue papillated, no cheilosis. Neck supple without adenopathy, thyroid not enlarged, no carotid bruits, no JVD. Lungs Clear to auscultation bilaterally. Cor normal S1 normal S2, regular rhythm , no murmur,  quiet precordium. Abdomen soft nontender abdomen with normoactive bowel sounds. No distention. Liver edge at costal margin. Post cholecystectomy scar at the umbilicus. Extremities no pedal edema. Skin no lesions. Neurological alert and oriented x 3. Psychological normal mood and affect.  Assessment and Plan:  Problems #1 Noncardiac chest pain most likely related to gastroesophageal reflux. It is controlled on PPI's. I suspect chronic silent reflux with suggestion of LPR. We will proceed with an upper endoscopy. She had an EGD in 2006 by Dr. Corinda Gubler, which showed esophagitis. She will continue on the PPI at this time. We will rule out Barrett's esophagus. We will also obtain small bowel biopsies because of her  positive sprue antibody. She is to stop ibuprofen.  Problem #2 colorectal screening. Patient has a family history of colon cancer in a grandparent. Her next colonoscopy will be due 7-10 years from the last exam in March 2010.    09/02/2010 Lina Sar

## 2010-09-02 NOTE — Patient Instructions (Signed)
You have been scheduled for an endoscopy. Please follow instructions given to you at your visit today.

## 2010-09-06 ENCOUNTER — Encounter: Payer: Self-pay | Admitting: Internal Medicine

## 2010-09-06 ENCOUNTER — Ambulatory Visit (AMBULATORY_SURGERY_CENTER): Payer: BC Managed Care – PPO | Admitting: Internal Medicine

## 2010-09-06 DIAGNOSIS — K219 Gastro-esophageal reflux disease without esophagitis: Secondary | ICD-10-CM

## 2010-09-06 DIAGNOSIS — K2940 Chronic atrophic gastritis without bleeding: Secondary | ICD-10-CM

## 2010-09-06 DIAGNOSIS — R933 Abnormal findings on diagnostic imaging of other parts of digestive tract: Secondary | ICD-10-CM

## 2010-09-06 DIAGNOSIS — R079 Chest pain, unspecified: Secondary | ICD-10-CM

## 2010-09-06 MED ORDER — SODIUM CHLORIDE 0.9 % IV SOLN: 500.0000 mL | INTRAVENOUS | Status: DC

## 2010-09-06 NOTE — Patient Instructions (Signed)
Discharge instructions given.  You may resume your medications as you would normally take them. Continue Dexilant 60mg  by mouth daily Await biopsy results

## 2010-09-07 ENCOUNTER — Telehealth: Payer: Self-pay | Admitting: *Deleted

## 2010-09-07 NOTE — Telephone Encounter (Signed)

## 2010-09-13 ENCOUNTER — Encounter: Payer: Self-pay | Admitting: Internal Medicine

## 2010-11-27 ENCOUNTER — Other Ambulatory Visit: Payer: Self-pay | Admitting: Internal Medicine

## 2010-11-29 NOTE — Telephone Encounter (Signed)
Last ov 07/02/10  NOV- none Please advise

## 2011-03-25 ENCOUNTER — Encounter: Payer: Self-pay | Admitting: Internal Medicine

## 2011-03-25 ENCOUNTER — Other Ambulatory Visit: Payer: Self-pay | Admitting: Internal Medicine

## 2011-03-25 ENCOUNTER — Ambulatory Visit (INDEPENDENT_AMBULATORY_CARE_PROVIDER_SITE_OTHER): Payer: BC Managed Care – PPO | Admitting: Internal Medicine

## 2011-03-25 VITALS — BP 120/80 | HR 72 | Temp 99.0°F | Wt 172.0 lb

## 2011-03-25 DIAGNOSIS — J029 Acute pharyngitis, unspecified: Secondary | ICD-10-CM

## 2011-03-25 DIAGNOSIS — M542 Cervicalgia: Secondary | ICD-10-CM

## 2011-03-25 MED ORDER — CYCLOBENZAPRINE HCL 10 MG PO TABS: 10.0000 mg | ORAL_TABLET | Freq: Three times a day (TID) | ORAL | Status: AC | PRN

## 2011-03-25 NOTE — Progress Notes (Signed)
  Subjective:    Patient ID: Debra [name], female    DOB: [birth year], 45 y.o.   MRN: [id number]  HPI Comes in for new problem evaluation. Sore throat recently ? If was a virus  About  8 days ago. .  Slight congestion.   Awoke with kink  In neck  4 days ago  Mostly on right neck   Had massage ? If any help  And then next day was bad at work.   nauseous. Took  Ibuprofen  2 days ago .  Today better but no sleep last night.   From dog issues.   No numbness or weakness of liimbs  No fever cough  Or sig ha   Review of Systems As per hpin no vision hearing issue and no injury . No rash  Shingles etc.     Objective:   Physical Exam HEENT: Normocephalic ;atraumatic , Eyes;  PERRL, EOMs  Full, lids and conjunctiva clear,,Ears: no deformities, canals nl, TM landmarks normal, Nose: no deformity or discharge minimal congestion   Mouth : OP clear without lesion or edema .mildly red op no drainage seen NECK supple but tight trap on right   No adenopathy  Chest:  Clear to A&P without wheezes rales or rhonchi CV:  S1-S2 no gallops or murmurs peripheral perfusion is normal NEURO: oriented x 3 CN 3-12 appear intact. No focal muscle weakness or atrophy. DTRs symmetrical. Gait WNL.  Grossly non focal. No tremor or abnormal movement.      Assessment & Plan:  Sore throat poss viral or drainage   R/o strep  Will contact about cx  Neck pain seems MS and no alarm features Counseled. Conservative managemnt and exercise sheet given and to fu if  persistent or progressive neck hygiene attention also

## 2011-03-25 NOTE — Patient Instructions (Signed)
Will notify you  of culture when available.  This is prob viral with muscle spasm  Of neck. Try the motion exercises Muscle relaxant if needed.

## 2011-03-26 ENCOUNTER — Encounter: Payer: Self-pay | Admitting: Internal Medicine

## 2011-03-27 LAB — CULTURE, GROUP A STREP: Organism ID, Bacteria: NORMAL

## 2011-03-30 NOTE — Progress Notes (Signed)
Left message on machine about results. 

## 2011-05-02 ENCOUNTER — Telehealth: Payer: Self-pay | Admitting: *Deleted

## 2011-05-02 MED ORDER — GUAIFENESIN-CODEINE 100-10 MG/5ML PO SOLN: ORAL | Status: DC

## 2011-05-02 NOTE — Telephone Encounter (Signed)
Pt requesting a rx for robitussin ac. Pt has laryngitis and coughing a lot.   Per Dr. Fabian Sharp- ok to do Robitussin AC 6 oz.  Rx called in.

## 2011-06-27 ENCOUNTER — Other Ambulatory Visit: Payer: Self-pay

## 2011-06-27 MED ORDER — TIZANIDINE HCL 4 MG PO CAPS: ORAL_CAPSULE | ORAL | Status: DC

## 2011-06-27 NOTE — Telephone Encounter (Signed)
Ok per Dr. Fabian Sharp to changed pt's rx to Zanaflex 4 mg.  Rx sent to pharmacy.

## 2011-09-20 ENCOUNTER — Ambulatory Visit (INDEPENDENT_AMBULATORY_CARE_PROVIDER_SITE_OTHER): Payer: BC Managed Care – PPO | Admitting: Internal Medicine

## 2011-09-20 ENCOUNTER — Encounter: Payer: Self-pay | Admitting: Internal Medicine

## 2011-09-20 VITALS — BP 102/76 | HR 84 | Temp 98.9°F | Wt 169.0 lb

## 2011-09-20 DIAGNOSIS — J312 Chronic pharyngitis: Secondary | ICD-10-CM

## 2011-09-20 DIAGNOSIS — K219 Gastro-esophageal reflux disease without esophagitis: Secondary | ICD-10-CM

## 2011-09-20 MED ORDER — FLUTICASONE PROPIONATE 50 MCG/ACT NA SUSP: 2.0000 | Freq: Every day | NASAL | Status: DC

## 2011-09-20 NOTE — Patient Instructions (Addendum)
You may still have  Silent reflux. Add nasal steroid  To see if any post nasal drainage allergy could be adding to the throat irritation.  Take your dexilant every day  Need call or fu in 3-4 weeks if not getting better we may get ENT.  Or Dr Juanda Chance to  see you again.    Diet for GERD or PUD Nutrition therapy can help ease the discomfort of gastroesophageal reflux disease (GERD) and peptic ulcer disease (PUD).  HOME CARE INSTRUCTIONS   Eat your meals slowly, in a relaxed setting.   Eat 5 to 6 small meals per day.   If a food causes distress, stop eating it for a period of time.  FOODS TO AVOID  Coffee, regular or decaffeinated.   Cola beverages, regular or low calorie.   Tea, regular or decaffeinated.   Pepper.   Cocoa.   High fat foods, including meats.   Butter, margarine, hydrogenated oil (trans fats).   Peppermint or spearmint (if you have GERD).   Fruits and vegetables if not tolerated.   Alcohol.   Nicotine (smoking or chewing). This is one of the most potent stimulants to acid production in the gastrointestinal tract.   Any food that seems to aggravate your condition.  If you have questions regarding your diet, ask your caregiver or a registered dietitian. TIPS  Lying flat may make symptoms worse. Keep the head of your bed raised 6 to 9 inches (15 to 23 cm) by using a foam wedge or blocks under the legs of the bed.   Do not lay down until 3 hours after eating a meal.   Daily physical activity may help reduce symptoms.  MAKE SURE YOU:   Understand these instructions.   Will watch your condition.   Will get help right away if you are not doing well or get worse.  Document Released: 05/09/2005 Document Revised: 04/28/2011 Document Reviewed: 03/25/2011 [facility] Patient Information 2012 [place], [place].

## 2011-09-20 NOTE — Progress Notes (Signed)
  Subjective:    Patient ID: Debra [name], female    DOB: 04-30-1966, 46 y.o.   MRN: [id number]  HPI Patient comes in today for SDA for  Recurring and worsening  problem evaluation. Pain in  Throat for a few months  ? If reflux or allergy and yesterday  saw white area in aback in throat  Now gone .  She has had past problems with her throat tends to bother her a lot as the day goes on and best in the morning. I   Raw irritated. Gets some throat clearing and occasional cough. Is supposed to be taking Dexilant every day but hasn't been taking it regularly until the last week Only off and on.  She states that warm liquids help her throat feel better EGD years ago? And dx  esophagitis. And has seen dr Juanda Chance.  Dr Corinda Gubler recent EGD neg for esophagitis had bx done Remote hx of  Reflux poss sx. Has also seen Dr. Ezzard Standing ENT in the past and apparently had a normal exam.  Currently she has no fever no itching and sneezing like allergy nose feels fairly clearer although she has some mucus drainage feeling in her throat.  Review of Systems Negative for fever chest pain weight loss swollen glands vomiting.  Past history family history social history reviewed in the electronic medical record. See egd in past.     Objective:   Physical Exam BP 102/76  Pulse 84  Temp(Src) 98.9 F (37.2 C) (Oral)  Wt 169 lb (76.658 kg) WDWN iin nad  HEENT: Normocephalic ;atraumatic , Eyes;  PERRL, EOMs  Full, lids and conjunctiva clear,,Ears: no deformities, canals nl, TM landmarks normal, Nose: no deformity or discharge face nt  Mouth : OP  tonsils absent cobblestoning with some redness on the posterior pharyngeal wall but no lesions are noted no edema Neck supple without adenopathy. Chest:  Clear to A&P without wheezes rales or rhonchi CV:  S1-S2 no gallops or murmurs peripheral perfusion is normal     Assessment & Plan:   Recurrent sore throat problematic   History of esophagitis no history of  allergy  The above problem is problematic and ongoing.  She will optimize her antireflux treatment at present we will empirically Flonase in case there is a postnasal drainage problem adding to her symptoms. If this is not improving we will get back with the specialist. We can consider changing to another medicine but the current regimen seems appropriate.

## 2011-09-30 ENCOUNTER — Other Ambulatory Visit: Payer: Self-pay | Admitting: Internal Medicine

## 2011-09-30 NOTE — Telephone Encounter (Signed)
Pt last seen 09/20/11.  Pls advise.

## 2011-09-30 NOTE — Telephone Encounter (Signed)
Ok to refill 

## 2011-10-18 ENCOUNTER — Encounter: Payer: Self-pay | Admitting: Internal Medicine

## 2011-10-18 ENCOUNTER — Ambulatory Visit (INDEPENDENT_AMBULATORY_CARE_PROVIDER_SITE_OTHER): Payer: BC Managed Care – PPO | Admitting: Internal Medicine

## 2011-10-18 VITALS — BP 104/80 | HR 70 | Temp 99.1°F | Wt 167.0 lb

## 2011-10-18 DIAGNOSIS — Z8719 Personal history of other diseases of the digestive system: Secondary | ICD-10-CM

## 2011-10-18 DIAGNOSIS — J029 Acute pharyngitis, unspecified: Secondary | ICD-10-CM

## 2011-10-18 DIAGNOSIS — K219 Gastro-esophageal reflux disease without esophagitis: Secondary | ICD-10-CM

## 2011-10-18 MED ORDER — DEXLANSOPRAZOLE 60 MG PO CPDR: 60.0000 mg | DELAYED_RELEASE_CAPSULE | Freq: Every day | ORAL | Status: DC

## 2011-10-18 NOTE — Patient Instructions (Signed)
Continue as we said . Refill the dexilant today.  Check up in 4=6 months

## 2011-10-18 NOTE — Progress Notes (Signed)
  Subjective:    Patient ID: Debra [name], female    DOB: [birth year], 46 y.o.   MRN: [id number]  HPI Patient comes in today for followup of sore throat of unknown uncertain etiology. Since the last visit she got better after using daily cortisone no spray and her DEXILANT for at least a week since that time she's decreased it somewhat because she is better and it hasn't recurred. Better  . No current cough chest pain or shortness of breath.  Review of Systems Negative fever or dysphasia. She does have a.m. nausea at times and beginning to eat small meals frequently    Past history family history social history reviewed in the electronic medical record.  Objective:   Physical Exam BP 104/80  Pulse 70  Temp(Src) 99.1 F (37.3 C) (Oral)  Wt 167 lb (75.751 kg)  SpO2 98%  LMP 10/04/2011 heent [place] at OP clear normal nasal exam Chest:  Clear to A&P without wheezes rales or rhonchi CV:  S1-S2 no gallops or murmurs peripheral perfusion is normal     Assessment & Plan:  Sore throat Improved possibly silent reflex plus minus either allergic or irritative postnasal drainage.  At this point in time she can decrease the frequency of usage if symptoms come back intensify therapy. If recurring persistent or unresponsive we'll consider other evaluation but at this time I see no other alarm symptoms.  Have her  recheck for checkup in 5-6 months with labs. We'll readdress at this time ;refill her PPI at present

## 2012-01-07 ENCOUNTER — Other Ambulatory Visit: Payer: Self-pay | Admitting: Internal Medicine

## 2012-01-10 ENCOUNTER — Encounter: Payer: Self-pay | Admitting: Internal Medicine

## 2012-01-10 ENCOUNTER — Ambulatory Visit (INDEPENDENT_AMBULATORY_CARE_PROVIDER_SITE_OTHER): Payer: BC Managed Care – PPO | Admitting: Internal Medicine

## 2012-01-10 VITALS — BP 100/64 | HR 83 | Temp 99.9°F | Wt 167.0 lb

## 2012-01-10 DIAGNOSIS — G43109 Migraine with aura, not intractable, without status migrainosus: Secondary | ICD-10-CM

## 2012-01-10 DIAGNOSIS — R51 Headache: Secondary | ICD-10-CM

## 2012-01-10 MED ORDER — HYDROCODONE-ACETAMINOPHEN 5-325 MG PO TABS: 1.0000 | ORAL_TABLET | ORAL | Status: AC | PRN

## 2012-01-10 MED ORDER — KETOROLAC TROMETHAMINE 60 MG/2ML IM SOLN
60.0000 mg | Freq: Once | INTRAMUSCULAR | Status: AC
  Administered 2012-01-10: 60 mg via INTRAMUSCULAR

## 2012-01-10 NOTE — Patient Instructions (Signed)
Ok to continue the promethazine as needed. This still acting  like a migraine   Can try rescue medication  . If not getting better we may add prednisone.  Call if not a lot better in the next 24 hours or if gets a fever etc.  Track your headaches  To check for triggers if getting more frequent.

## 2012-01-10 NOTE — Progress Notes (Signed)
  Subjective:    Patient ID: Debra [name], female    DOB: 05/07/1966, 46 y.o.   MRN: [id number]  HPI  Patient comes in today for SDA for  acute problem evaluation. She is here with her husband today. She has a history of headaches but has had a recent one that has lasted longer and more severe than usual. It is atypical for her. Onset about 3-1/2 days ago first noted   Hard to focus and quivering  vision For 30 minutes then a headache Came one.  Took tramadol    And then Excedrin Migraine day imitrex but worked.  Some help and still nausea a said took another promethazine and imitrex  Yesterday. Pain 8/10 at its worse Now about a 6/10  and nausea.  Currently he feels like a Band around eyes and  Not throbbing.   Period this week.  Her headache feels worse when she lays down sometimes now when she bends over no balancing symptoms falling. No focal weakness. No fever but some chills  .  Review of Systems No bleeding bruising vomiting diarrhea but does have significant nausea. No change in respiratory cardiac symptoms. No swollen glands today. Past history family history social history reviewed in the electronic medical record.Past history family history social history reviewed in the electronic medical record.     Objective:   Physical Exam BP 100/64  Pulse 83  Temp 99.9 F (37.7 C) (Oral)  Wt 167 lb (75.751 kg)  SpO2 98%  LMP 01/08/2012 Repeat bp 108/70  WDWN in nad looks tired and a bit wiped out. HEENT: Normocephalic ;atraumatic , Eyes;  PERRL, EOMs  Full, lids and conjunctiva clear,,Ears: no deformities, canals nl, TM landmarks normal, Nose: no deformity or discharge  Mouth : OP clear without lesion or edema . mildy erythema  Teeth  Braces  Neck: Supple without adenopathy or masses or bruits Chest:  Clear to A without wheezes rales or rhonchi CV:  S1-S2 no gallops or murmurs peripheral perfusion is normal Abdomen:  Sof,t normal bowel sounds without hepatosplenomegaly, no guarding  rebound or masses no CVA tenderness NEURO: oriented x 3 CN 3-12 appear intact. No focal muscle weakness or atrophy. DTRs symmetrical. Gait WNL.  Grossly non focal. No tremor or abnormal movement. Neg [place]  Station.=     Assessment & Plan:   Headache prolonged with hx of same  Had visual prodrome but continuing.  Somewhat atypical for this but no other alarming findings   acts like a prolonged migraine. Discussed options we'll give Toradol shot today and she can take her promethazine. No written for work if she's not better by tomorrow. It is continuing consider prednisone treatment. If persistent or progressive can get other evaluation.  Headache calendar to track to pick up on triggers management refer as appropriate.

## 2012-03-22 ENCOUNTER — Other Ambulatory Visit: Payer: Self-pay | Admitting: Internal Medicine

## 2012-03-28 ENCOUNTER — Other Ambulatory Visit (INDEPENDENT_AMBULATORY_CARE_PROVIDER_SITE_OTHER): Payer: BC Managed Care – PPO

## 2012-03-28 DIAGNOSIS — Z0000 Encounter for general adult medical examination without abnormal findings: Secondary | ICD-10-CM

## 2012-03-28 LAB — POCT URINALYSIS DIPSTICK
Bilirubin, UA: NEGATIVE
Glucose, UA: NEGATIVE
Ketones, UA: NEGATIVE
Leukocytes, UA: NEGATIVE
Nitrite, UA: NEGATIVE
Protein, UA: NEGATIVE
Spec Grav, UA: 1.02
Urobilinogen, UA: 0.2
pH, UA: 6

## 2012-03-28 LAB — BASIC METABOLIC PANEL
BUN: 14 mg/dL (ref 6–23)
CO2: 27 mEq/L (ref 19–32)
Calcium: 9 mg/dL (ref 8.4–10.5)
Chloride: 105 mEq/L (ref 96–112)
Creatinine, Ser: 0.8 mg/dL (ref 0.4–1.2)
GFR: 83.51 mL/min (ref >60.00)
Glucose, Bld: 89 mg/dL (ref 70–99)
Potassium: 4.2 mEq/L (ref 3.5–5.1)
Sodium: 138 mEq/L (ref 135–145)

## 2012-03-28 LAB — CBC WITH DIFFERENTIAL/PLATELET
Basophils Absolute: 0.1 10*3/uL (ref 0.0–0.1)
Basophils Relative: 1.1 % (ref 0.0–3.0)
Eosinophils Absolute: 0.2 10*3/uL (ref 0.0–0.7)
Eosinophils Relative: 4 % (ref 0.0–5.0)
HCT: 40.5 % (ref 36.0–46.0)
Hemoglobin: 13.5 g/dL (ref 12.0–15.0)
Lymphocytes Relative: 31.2 % (ref 12.0–46.0)
Lymphs Abs: 1.9 10*3/uL (ref 0.7–4.0)
MCHC: 33.2 g/dL (ref 30.0–36.0)
MCV: 95.2 fl (ref 78.0–100.0)
Monocytes Absolute: 0.5 10*3/uL (ref 0.1–1.0)
Monocytes Relative: 8 % (ref 3.0–12.0)
Neutro Abs: 3.4 10*3/uL (ref 1.4–7.7)
Neutrophils Relative %: 55.7 % (ref 43.0–77.0)
Platelets: 266 10*3/uL (ref 150.0–400.0)
RBC: 4.26 Mil/uL (ref 3.87–5.11)
RDW: 13.4 % (ref 11.5–14.6)
WBC: 6.1 10*3/uL (ref 4.5–10.5)

## 2012-03-28 LAB — TSH: TSH: 1.09 u[IU]/mL (ref 0.35–5.50)

## 2012-03-28 LAB — HEPATIC FUNCTION PANEL
ALT: 16 U/L (ref 0–35)
AST: 20 U/L (ref 0–37)
Albumin: 3.9 g/dL (ref 3.5–5.2)
Alkaline Phosphatase: 39 U/L (ref 39–117)
Bilirubin, Direct: 0.1 mg/dL (ref 0.0–0.3)
Total Bilirubin: 0.7 mg/dL (ref 0.3–1.2)
Total Protein: 7.3 g/dL (ref 6.0–8.3)

## 2012-03-28 LAB — LIPID PANEL
Cholesterol: 200 mg/dL (ref 0–200)
HDL: 52.6 mg/dL (ref >39.00)
LDL Cholesterol: 135 mg/dL — ABNORMAL HIGH (ref 0–99)
Total CHOL/HDL Ratio: 4
Triglycerides: 61 mg/dL (ref 0.0–149.0)
VLDL: 12.2 mg/dL (ref 0.0–40.0)

## 2012-04-03 ENCOUNTER — Ambulatory Visit (INDEPENDENT_AMBULATORY_CARE_PROVIDER_SITE_OTHER): Payer: BC Managed Care – PPO | Admitting: Internal Medicine

## 2012-04-03 ENCOUNTER — Encounter: Payer: Self-pay | Admitting: Internal Medicine

## 2012-04-03 VITALS — BP 110/70 | HR 97 | Temp 98.7°F | Ht 67.25 in | Wt 166.0 lb

## 2012-04-03 DIAGNOSIS — Z8719 Personal history of other diseases of the digestive system: Secondary | ICD-10-CM

## 2012-04-03 DIAGNOSIS — I868 Varicose veins of other specified sites: Secondary | ICD-10-CM

## 2012-04-03 DIAGNOSIS — Z0000 Encounter for general adult medical examination without abnormal findings: Secondary | ICD-10-CM

## 2012-04-03 DIAGNOSIS — K219 Gastro-esophageal reflux disease without esophagitis: Secondary | ICD-10-CM

## 2012-04-03 DIAGNOSIS — R51 Headache: Secondary | ICD-10-CM

## 2012-04-03 NOTE — Patient Instructions (Signed)
Calendar   HAs  If frequent consider using topamax for suppression.   Call in a month limit artificial sweeteners. Consider  Compression stockings  When upright  OTC.  Preventive Care for Adults, Female A healthy lifestyle and preventive care can promote health and wellness. Preventive health guidelines for women include the following key practices.  A routine yearly physical is a good way to check with your caregiver about your health and preventive screening. It is a chance to share any concerns and updates on your health, and to receive a thorough exam.  Visit your dentist for a routine exam and preventive care every 6 months. Brush your teeth twice a day and floss once a day. Good oral hygiene prevents tooth decay and gum disease.  The frequency of eye exams is based on your age, health, family medical history, use of contact lenses, and other factors. Follow your caregiver's recommendations for frequency of eye exams.  Eat a healthy diet. Foods like vegetables, fruits, whole grains, low-fat dairy products, and lean protein foods contain the nutrients you need without too many calories. Decrease your intake of foods high in solid fats, added sugars, and salt. Eat the right amount of calories for you.Get information about a proper diet from your caregiver, if necessary.  Regular physical exercise is one of the most important things you can do for your health. Most adults should get at least 150 minutes of moderate-intensity exercise (any activity that increases your heart rate and causes you to sweat) each week. In addition, most adults need muscle-strengthening exercises on 2 or more days a week.  Maintain a healthy weight. The body mass index (BMI) is a screening tool to identify possible weight problems. It provides an estimate of body fat based on height and weight. Your caregiver can help determine your BMI, and can help you achieve or maintain a healthy weight.For adults 20 years and  older:  A BMI below 18.5 is considered underweight.  A BMI of 18.5 to 24.9 is normal.  A BMI of 25 to 29.9 is considered overweight.  A BMI of 30 and above is considered obese.  Maintain normal blood lipids and cholesterol levels by exercising and minimizing your intake of saturated fat. Eat a balanced diet with plenty of fruit and vegetables. Blood tests for lipids and cholesterol should begin at age 54 and be repeated every 5 years. If your lipid or cholesterol levels are high, you are over 50, or you are at high risk for heart disease, you may need your cholesterol levels checked more frequently.Ongoing high lipid and cholesterol levels should be treated with medicines if diet and exercise are not effective.  If you smoke, find out from your caregiver how to quit. If you do not use tobacco, do not start.  If you are pregnant, do not drink alcohol. If you are breastfeeding, be very cautious about drinking alcohol. If you are not pregnant and choose to drink alcohol, do not exceed 1 drink per day. One drink is considered to be 12 ounces (355 mL) of beer, 5 ounces (148 mL) of wine, or 1.5 ounces (44 mL) of liquor.  Avoid use of street drugs. Do not share needles with anyone. Ask for help if you need support or instructions about stopping the use of drugs.  High blood pressure causes heart disease and increases the risk of stroke. Your blood pressure should be checked at least every 1 to 2 years. Ongoing high blood pressure should be treated with  medicines if weight loss and exercise are not effective.  If you are 79 to 45 years old, ask your caregiver if you should take aspirin to prevent strokes.  Diabetes screening involves taking a blood sample to check your fasting blood sugar level. This should be done once every 3 years, after age 5, if you are within normal weight and without risk factors for diabetes. Testing should be considered at a younger age or be carried out more frequently if  you are overweight and have at least 1 risk factor for diabetes.  Breast cancer screening is essential preventive care for women. You should practice "breast self-awareness." This means understanding the normal appearance and feel of your breasts and may include breast self-examination. Any changes detected, no matter how small, should be reported to a caregiver. Women in their 79s and 30s should have a clinical breast exam (CBE) by a caregiver as part of a regular health exam every 1 to 3 years. After age 30, women should have a CBE every year. Starting at age 86, women should consider having a mammography (breast X-ray test) every year. Women who have a family history of breast cancer should talk to their caregiver about genetic screening. Women at a high risk of breast cancer should talk to their caregivers about having magnetic resonance imaging (MRI) and a mammography every year.  The Pap test is a screening test for cervical cancer. A Pap test can show cell changes on the cervix that might become cervical cancer if left untreated. A Pap test is a procedure in which cells are obtained and examined from the lower end of the uterus (cervix).  Women should have a Pap test starting at age 21.  Between ages 32 and 62, Pap tests should be repeated every 2 years.  Beginning at age 79, you should have a Pap test every 3 years as long as the past 3 Pap tests have been normal.  Some women have medical problems that increase the chance of getting cervical cancer. Talk to your caregiver about these problems. It is especially important to talk to your caregiver if a new problem develops soon after your last Pap test. In these cases, your caregiver may recommend more frequent screening and Pap tests.  The above recommendations are the same for women who have or have not gotten the vaccine for human papillomavirus (HPV).  If you had a hysterectomy for a problem that was not cancer or a condition that could  lead to cancer, then you no longer need Pap tests. Even if you no longer need a Pap test, a regular exam is a good idea to make sure no other problems are starting.  If you are between ages 72 and 3, and you have had normal Pap tests going back 10 years, you no longer need Pap tests. Even if you no longer need a Pap test, a regular exam is a good idea to make sure no other problems are starting.  If you have had past treatment for cervical cancer or a condition that could lead to cancer, you need Pap tests and screening for cancer for at least 20 years after your treatment.  If Pap tests have been discontinued, risk factors (such as a new sexual partner) need to be reassessed to determine if screening should be resumed.  The HPV test is an additional test that may be used for cervical cancer screening. The HPV test looks for the virus that can cause the cell changes  on the cervix. The cells collected during the Pap test can be tested for HPV. The HPV test could be used to screen women aged 32 years and older, and should be used in women of any age who have unclear Pap test results. After the age of 43, women should have HPV testing at the same frequency as a Pap test.  Colorectal cancer can be detected and often prevented. Most routine colorectal cancer screening begins at the age of 71 and continues through age 70. However, your caregiver may recommend screening at an earlier age if you have risk factors for colon cancer. On a yearly basis, your caregiver may provide home test kits to check for hidden blood in the stool. Use of a small camera at the end of a tube, to directly examine the colon (sigmoidoscopy or colonoscopy), can detect the earliest forms of colorectal cancer. Talk to your caregiver about this at age 57, when routine screening begins. Direct examination of the colon should be repeated every 5 to 10 years through age 67, unless early forms of pre-cancerous polyps or small growths are  found.  Hepatitis C blood testing is recommended for all people born from [birth year] through 1965 and any individual with known risks for hepatitis C.  Practice safe sex. Use condoms and avoid high-risk sexual practices to reduce the spread of sexually transmitted infections (STIs). STIs include gonorrhea, chlamydia, syphilis, trichomonas, herpes, HPV, and human immunodeficiency virus (HIV). Herpes, HIV, and HPV are viral illnesses that have no cure. They can result in disability, cancer, and death. Sexually active women aged 83 and younger should be checked for chlamydia. Older women with new or multiple partners should also be tested for chlamydia. Testing for other STIs is recommended if you are sexually active and at increased risk.  Osteoporosis is a disease in which the bones lose minerals and strength with aging. This can result in serious bone fractures. The risk of osteoporosis can be identified using a bone density scan. Women ages 51 and over and women at risk for fractures or osteoporosis should discuss screening with their caregivers. Ask your caregiver whether you should take a calcium supplement or vitamin D to reduce the rate of osteoporosis.  Menopause can be associated with physical symptoms and risks. Hormone replacement therapy is available to decrease symptoms and risks. You should talk to your caregiver about whether hormone replacement therapy is right for you.  Use sunscreen with sun protection factor (SPF) of 30 or more. Apply sunscreen liberally and repeatedly throughout the day. You should seek shade when your shadow is shorter than you. Protect yourself by wearing long sleeves, pants, a wide-brimmed hat, and sunglasses year round, whenever you are outdoors.  Once a month, do a whole body skin exam, using a mirror to look at the skin on your back. Notify your caregiver of new moles, moles that have irregular borders, moles that are larger than a pencil eraser, or moles that have  changed in shape or color.  Stay current with required immunizations.  Influenza. You need a dose every fall (or winter). The composition of the flu vaccine changes each year, so being vaccinated once is not enough.  Pneumococcal polysaccharide. You need 1 to 2 doses if you smoke cigarettes or if you have certain chronic medical conditions. You need 1 dose at age 91 (or older) if you have never been vaccinated.  Tetanus, diphtheria, pertussis (Tdap, Td). Get 1 dose of Tdap vaccine if you are younger than age  65, are over 65 and have contact with an infant, are a Research scientist (physical sciences), are pregnant, or simply want to be protected from whooping cough. After that, you need a Td booster dose every 10 years. Consult your caregiver if you have not had at least 3 tetanus and diphtheria-containing shots sometime in your life or have a deep or dirty wound.  HPV. You need this vaccine if you are a woman age 52 or younger. The vaccine is given in 3 doses over 6 months.  Measles, mumps, rubella (MMR). You need at least 1 dose of MMR if you were born in 1957 or later. You may also need a second dose.  Meningococcal. If you are age 66 to 36 and a first-year college student living in a residence hall, or have one of several medical conditions, you need to get vaccinated against meningococcal disease. You may also need additional booster doses.  Zoster (shingles). If you are age 81 or older, you should get this vaccine.  Varicella (chickenpox). If you have never had chickenpox or you were vaccinated but received only 1 dose, talk to your caregiver to find out if you need this vaccine.  Hepatitis A. You need this vaccine if you have a specific risk factor for hepatitis A virus infection or you simply wish to be protected from this disease. The vaccine is usually given as 2 doses, 6 to 18 months apart.  Hepatitis B. You need this vaccine if you have a specific risk factor for hepatitis B virus infection or you  simply wish to be protected from this disease. The vaccine is given in 3 doses, usually over 6 months. Preventive Services / Frequency Ages 58 to 63  Blood pressure check.** / Every 1 to 2 years.  Lipid and cholesterol check.** / Every 5 years beginning at age 20.  Clinical breast exam.** / Every 3 years for women in their 40s and 30s.  Pap test.** / Every 2 years from ages 87 through 77. Every 3 years starting at age 85 through age 2 or 38 with a history of 3 consecutive normal Pap tests.  HPV screening.** / Every 3 years from ages 38 through ages 58 to 74 with a history of 3 consecutive normal Pap tests.  Hepatitis C blood test.** / For any individual with known risks for hepatitis C.  Skin self-exam. / Monthly.  Influenza immunization.** / Every year.  Pneumococcal polysaccharide immunization.** / 1 to 2 doses if you smoke cigarettes or if you have certain chronic medical conditions.  Tetanus, diphtheria, pertussis (Tdap, Td) immunization. / A one-time dose of Tdap vaccine. After that, you need a Td booster dose every 10 years.  HPV immunization. / 3 doses over 6 months, if you are 42 and younger.  Measles, mumps, rubella (MMR) immunization. / You need at least 1 dose of MMR if you were born in 1957 or later. You may also need a second dose.  Meningococcal immunization. / 1 dose if you are age 35 to 73 and a first-year college student living in a residence hall, or have one of several medical conditions, you need to get vaccinated against meningococcal disease. You may also need additional booster doses.  Varicella immunization.** / Consult your caregiver.  Hepatitis A immunization.** / Consult your caregiver. 2 doses, 6 to 18 months apart.  Hepatitis B immunization.** / Consult your caregiver. 3 doses usually over 6 months. Ages 42 to 51  Blood pressure check.** / Every 1 to 2 years.  Lipid and cholesterol check.** / Every 5 years beginning at age 79.  Clinical breast  exam.** / Every year after age 65.  Mammogram.** / Every year beginning at age 92 and continuing for as long as you are in good health. Consult with your caregiver.  Pap test.** / Every 3 years starting at age 32 through age 9 or 11 with a history of 3 consecutive normal Pap tests.  HPV screening.** / Every 3 years from ages 60 through ages 76 to 70 with a history of 3 consecutive normal Pap tests.  Fecal occult blood test (FOBT) of stool. / Every year beginning at age 17 and continuing until age 72. You may not need to do this test if you get a colonoscopy every 10 years.  Flexible sigmoidoscopy or colonoscopy.** / Every 5 years for a flexible sigmoidoscopy or every 10 years for a colonoscopy beginning at age 36 and continuing until age 40.  Hepatitis C blood test.** / For all people born from [birth year] through 1964 and any individual with known risks for hepatitis C.  Skin self-exam. / Monthly.  Influenza immunization.** / Every year.  Pneumococcal polysaccharide immunization.** / 1 to 2 doses if you smoke cigarettes or if you have certain chronic medical conditions.  Tetanus, diphtheria, pertussis (Tdap, Td) immunization.** / A one-time dose of Tdap vaccine. After that, you need a Td booster dose every 10 years.  Measles, mumps, rubella (MMR) immunization. / You need at least 1 dose of MMR if you were born in 1957 or later. You may also need a second dose.  Varicella immunization.** / Consult your caregiver.  Meningococcal immunization.** / Consult your caregiver.  Hepatitis A immunization.** / Consult your caregiver. 2 doses, 6 to 18 months apart.  Hepatitis B immunization.** / Consult your caregiver. 3 doses, usually over 6 months. Ages 33 and over  Blood pressure check.** / Every 1 to 2 years.  Lipid and cholesterol check.** / Every 5 years beginning at age 16.  Clinical breast exam.** / Every year after age 87.  Mammogram.** / Every year beginning at age 40 and continuing  for as long as you are in good health. Consult with your caregiver.  Pap test.** / Every 3 years starting at age 52 through age 72 or 39 with a 3 consecutive normal Pap tests. Testing can be stopped between 65 and 70 with 3 consecutive normal Pap tests and no abnormal Pap or HPV tests in the past 10 years.  HPV screening.** / Every 3 years from ages 38 through ages 51 or 25 with a history of 3 consecutive normal Pap tests. Testing can be stopped between 65 and 70 with 3 consecutive normal Pap tests and no abnormal Pap or HPV tests in the past 10 years.  Fecal occult blood test (FOBT) of stool. / Every year beginning at age 47 and continuing until age 81. You may not need to do this test if you get a colonoscopy every 10 years.  Flexible sigmoidoscopy or colonoscopy.** / Every 5 years for a flexible sigmoidoscopy or every 10 years for a colonoscopy beginning at age 21 and continuing until age 104.  Hepatitis C blood test.** / For all people born from [birth year] through 1964 and any individual with known risks for hepatitis C.  Osteoporosis screening.** / A one-time screening for women ages 25 and over and women at risk for fractures or osteoporosis.  Skin self-exam. / Monthly.  Influenza immunization.** / Every year.  Pneumococcal polysaccharide immunization.** /  1 dose at age 38 (or older) if you have never been vaccinated.  Tetanus, diphtheria, pertussis (Tdap, Td) immunization. / A one-time dose of Tdap vaccine if you are over 65 and have contact with an infant, are a Research scientist (physical sciences), or simply want to be protected from whooping cough. After that, you need a Td booster dose every 10 years.  Varicella immunization.** / Consult your caregiver.  Meningococcal immunization.** / Consult your caregiver.  Hepatitis A immunization.** / Consult your caregiver. 2 doses, 6 to 18 months apart.  Hepatitis B immunization.** / Check with your caregiver. 3 doses, usually over 6 months. ** Family history  and personal history of risk and conditions may change your caregiver's recommendations. Document Released: 07/05/2001 Document Revised: 08/01/2011 Document Reviewed: 10/04/2010 [facility] Patient Information 2013 [place], [place].

## 2012-04-03 NOTE — Progress Notes (Signed)
Chief Complaint  Patient presents with  . Annual Exam    No pap.  Sees Dr. Jennette Kettle.    HPI: Patient comes in today for Preventive Health Care visit  Sinct visit no major change in health hosp injury.  Ongoing concerns: HAS:  Going on  Off an on   Back left neck  Used rescue med yesterday. Hydrocodone  couple time per week. Ibuprofen  Or imitrex.  Has braces now.  Periods ok sees Dr Jennette Kettle for paps had fertility rx in past.  ROS:  GEN/ HEENT: No fever, significant weight changes sweats vision problems hearing changes, CV/ PULM; No chest pain shortness of breath cough, syncope,edema  change in exercise tolerance. GI /GU: No adominal pain, vomiting, change in bowel habits. No blood in the stool. No significant GU symptoms. SKIN/HEME: ,no acute skin rashes suspicious lesions or bleeding. No lymphadenopathy, nodules, masses.  NEURO/ PSYCH:  No neurologic signs such as weakness numbness. No depression anxiety. IMM/ Allergy: No unusual infections.  Allergy .   REST of 12 system review negative except as per HPI   Past Medical History  Diagnosis Date  . EXTERNAL HEMORRHOIDS 02/18/2010  . VARICOSE VEIN 01/06/2010  . GERD 03/13/2007  . HIP PAIN, RIGHT 02/23/2009  . LOW BACK PAIN, MILD 08/06/2008  . FATIGUE 04/13/2007  . PARESTHESIA 03/13/2007  . Headache 03/13/2007  . Abdominal pain, epigastric 01/06/2010  . LIVER FUNCTION TESTS, ABNORMAL 01/06/2010  . ESOPHAGITIS, HX OF 02/18/2010  . Pulmonary nodule     incidental on ct 2007  neg cxray 2011  . Pancreatitis 12/18/09  . Chronic headaches   . Esophagitis   . Gallstones     Family History  Problem Relation Age of Onset  . Alcohol abuse Father   . Cardiomyopathy Father   . Anemia Father   . Heart disease Father   . Other Father     Irrg. Heart Beat  . Lung cancer Mother   . Diabetes Mother   . Other Brother     Irr. Heart Beat  . Pancreatitis Maternal Grandfather   . Colon cancer Maternal Grandmother     History   Social History    . Marital Status: Married    Spouse Name: N/A    Number of Children: 0  . Years of Education: N/A   Occupational History  . PHARMACIST    Social History Main Topics  . Smoking status: Never Smoker   . Smokeless tobacco: Never Used  . Alcohol Use: Yes     Comment: 2 per month  . Drug Use: No  . Sexually Active: None   Other Topics Concern  . 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 hx fertility treatment hh of 27- 8 hours  Sleep 3 diet coke per day2 etoh per month     Outpatient Encounter Prescriptions as of 04/03/2012  Medication Sig Dispense Refill  . dexlansoprazole (DEXILANT) 60 MG capsule Take 1 capsule (60 mg total) by mouth daily.  30 capsule  5  . HYDROcodone-acetaminophen (NORCO/VICODIN) 5-325 MG per tablet take 1 tablet by mouth every 4 hours if needed for pain  15 tablet  0  . ibuprofen (ADVIL,MOTRIN) 200 MG tablet Take 200 mg by mouth every 6 (six) hours as needed.        . promethazine (PHENERGAN) 25 MG tablet take 1 tablet by mouth every 4 to 6 hours if needed for nausea and vomiting  20  tablet  2  . SUMAtriptan (IMITREX) 100 MG tablet take 1 tablet by mouth if needed for headache (MAY REPEAT IN 2 HOURS IF NEEDED)  9 tablet  2  . [DISCONTINUED] flintstones complete (FLINTSTONES) 60 MG chewable tablet Chew 1 tablet by mouth daily.        . [DISCONTINUED] fluticasone (FLONASE) 50 MCG/ACT nasal spray Place 2 sprays into the nose daily.  16 g  3    EXAM:  BP 110/70  Pulse 97  Temp 98.7 F (37.1 C) (Oral)  Ht 5' 7.25" (1.708 m)  Wt 166 lb (75.297 kg)  BMI 25.81 kg/m2  SpO2 96%  LMP 03/20/2012  Body mass index is 25.81 kg/(m^2).  Physical Exam: Vital signs reviewed ONG:EXBM is a well-developed well-nourished alert cooperative   female who appears her stated age in no acute distress.  HEENT: normocephalic atraumatic , Eyes: PERRL EOM's full, conjunctiva clear, Nares: paten,t no deformity discharge or tenderness., Ears:  no deformity EAC's clear TMs with normal landmarks. Mouth: clear OP, no lesions, edema.  Moist mucous membranes. Dentition in adequate repair. Braces  NECK: supple without masses, thyromegaly or bruits. CHEST/PULM:  Clear to auscultation and percussion breath sounds equal no wheeze , rales or rhonchi. No chest wall deformities or tenderness. Breast: normal by inspection . No dimpling, discharge, masses, tenderness or discharge . CV: PMI is nondisplaced, S1 S2 no gallops, murmurs, rubs. Peripheral pulses are full without delay.No JVD .  ABDOMEN: Bowel sounds normal nontender  No guard or rebound, no hepato splenomegal no CVA tenderness.  No hernia. Extremtities:  No clubbing cyanosis or edema, no acute joint swelling or redness no focal atrophy vv no edema a left  No ulcer .  NEURO:  Oriented x3, cranial nerves 3-12 appear to be intact, no obvious focal weakness,gait within normal limits no abnormal reflexes or asymmetrical SKIN: No acute rashes normal turgor, color, no bruising or petechiae. Some chin heair no striae acne  PSYCH: Oriented, good eye contact, no obvious depression anxiety, cognition and judgment appear normal. LN: no cervical axillary inguinal adenopathy  Lab Results  Component Value Date   WBC 6.1 03/28/2012   HGB 13.5 03/28/2012   HCT 40.5 03/28/2012   PLT 266.0 03/28/2012   GLUCOSE 89 03/28/2012   CHOL 200 03/28/2012   TRIG 61.0 03/28/2012   HDL 52.60 03/28/2012   LDLCALC 135* 03/28/2012   ALT 16 03/28/2012   AST 20 03/28/2012   NA 138 03/28/2012   K 4.2 03/28/2012   CL 105 03/28/2012   CREATININE 0.8 03/28/2012   BUN 14 03/28/2012   CO2 27 03/28/2012   TSH 1.09 03/28/2012    ASSESSMENT AND PLAN:  Discussed the following assessment and plan: Preventive Health Care Counseled regarding healthy nutrition, exercise, sleep, injury prevention, calcium vit d and healthy weight . FLU done at work  PAP  utd dr Jennette Kettle  TDAP?will hold on this.  GI  :Stable using dexilant prn.  HAs   problematic recently  Calendar again and consider controller med 1. Visit for preventive health examination   2. VARICOSE VEIN    stable at present  3. Headache    migraine type mostly counseled track for prevnetion and poss controller med  4. GERD    stable  contolled on med for now  5. ESOPHAGITIS, HX OF      Patient Instructions  Calendar   HAs  If frequent consider using topamax for suppression.   Call in a month limit artificial sweeteners.  Consider  Compression stockings  When upright  OTC.  Preventive Care for Adults, Female A healthy lifestyle and preventive care can promote health and wellness. Preventive health guidelines for women include the following key practices.  A routine yearly physical is a good way to check with your caregiver about your health and preventive screening. It is a chance to share any concerns and updates on your health, and to receive a thorough exam.  Visit your dentist for a routine exam and preventive care every 6 months. Brush your teeth twice a day and floss once a day. Good oral hygiene prevents tooth decay and gum disease.  The frequency of eye exams is based on your age, health, family medical history, use of contact lenses, and other factors. Follow your caregiver's recommendations for frequency of eye exams.  Eat a healthy diet. Foods like vegetables, fruits, whole grains, low-fat dairy products, and lean protein foods contain the nutrients you need without too many calories. Decrease your intake of foods high in solid fats, added sugars, and salt. Eat the right amount of calories for you.Get information about a proper diet from your caregiver, if necessary.  Regular physical exercise is one of the most important things you can do for your health. Most adults should get at least 150 minutes of moderate-intensity exercise (any activity that increases your heart rate and causes you to sweat) each week. In addition, most adults need  muscle-strengthening exercises on 2 or more days a week.  Maintain a healthy weight. The body mass index (BMI) is a screening tool to identify possible weight problems. It provides an estimate of body fat based on height and weight. Your caregiver can help determine your BMI, and can help you achieve or maintain a healthy weight.For adults 20 years and older:  A BMI below 18.5 is considered underweight.  A BMI of 18.5 to 24.9 is normal.  A BMI of 25 to 29.9 is considered overweight.  A BMI of 30 and above is considered obese.  Maintain normal blood lipids and cholesterol levels by exercising and minimizing your intake of saturated fat. Eat a balanced diet with plenty of fruit and vegetables. Blood tests for lipids and cholesterol should begin at age 36 and be repeated every 5 years. If your lipid or cholesterol levels are high, you are over 50, or you are at high risk for heart disease, you may need your cholesterol levels checked more frequently.Ongoing high lipid and cholesterol levels should be treated with medicines if diet and exercise are not effective.  If you smoke, find out from your caregiver how to quit. If you do not use tobacco, do not start.  If you are pregnant, do not drink alcohol. If you are breastfeeding, be very cautious about drinking alcohol. If you are not pregnant and choose to drink alcohol, do not exceed 1 drink per day. One drink is considered to be 12 ounces (355 mL) of beer, 5 ounces (148 mL) of wine, or 1.5 ounces (44 mL) of liquor.  Avoid use of street drugs. Do not share needles with anyone. Ask for help if you need support or instructions about stopping the use of drugs.  High blood pressure causes heart disease and increases the risk of stroke. Your blood pressure should be checked at least every 1 to 2 years. Ongoing high blood pressure should be treated with medicines if weight loss and exercise are not effective.  If you are 60 to 45 years old, ask your  caregiver if you should take aspirin to prevent strokes.  Diabetes screening involves taking a blood sample to check your fasting blood sugar level. This should be done once every 3 years, after age 98, if you are within normal weight and without risk factors for diabetes. Testing should be considered at a younger age or be carried out more frequently if you are overweight and have at least 1 risk factor for diabetes.  Breast cancer screening is essential preventive care for women. You should practice "breast self-awareness." This means understanding the normal appearance and feel of your breasts and may include breast self-examination. Any changes detected, no matter how small, should be reported to a caregiver. Women in their 77s and 30s should have a clinical breast exam (CBE) by a caregiver as part of a regular health exam every 1 to 3 years. After age 57, women should have a CBE every year. Starting at age 63, women should consider having a mammography (breast X-ray test) every year. Women who have a family history of breast cancer should talk to their caregiver about genetic screening. Women at a high risk of breast cancer should talk to their caregivers about having magnetic resonance imaging (MRI) and a mammography every year.  The Pap test is a screening test for cervical cancer. A Pap test can show cell changes on the cervix that might become cervical cancer if left untreated. A Pap test is a procedure in which cells are obtained and examined from the lower end of the uterus (cervix).  Women should have a Pap test starting at age 25.  Between ages 32 and 73, Pap tests should be repeated every 2 years.  Beginning at age 37, you should have a Pap test every 3 years as long as the past 3 Pap tests have been normal.  Some women have medical problems that increase the chance of getting cervical cancer. Talk to your caregiver about these problems. It is especially important to talk to your  caregiver if a new problem develops soon after your last Pap test. In these cases, your caregiver may recommend more frequent screening and Pap tests.  The above recommendations are the same for women who have or have not gotten the vaccine for human papillomavirus (HPV).  If you had a hysterectomy for a problem that was not cancer or a condition that could lead to cancer, then you no longer need Pap tests. Even if you no longer need a Pap test, a regular exam is a good idea to make sure no other problems are starting.  If you are between ages 69 and 29, and you have had normal Pap tests going back 10 years, you no longer need Pap tests. Even if you no longer need a Pap test, a regular exam is a good idea to make sure no other problems are starting.  If you have had past treatment for cervical cancer or a condition that could lead to cancer, you need Pap tests and screening for cancer for at least 20 years after your treatment.  If Pap tests have been discontinued, risk factors (such as a new sexual partner) need to be reassessed to determine if screening should be resumed.  The HPV test is an additional test that may be used for cervical cancer screening. The HPV test looks for the virus that can cause the cell changes on the cervix. The cells collected during the Pap test can be tested for HPV. The HPV test could be used  to screen women aged 64 years and older, and should be used in women of any age who have unclear Pap test results. After the age of 85, women should have HPV testing at the same frequency as a Pap test.  Colorectal cancer can be detected and often prevented. Most routine colorectal cancer screening begins at the age of 43 and continues through age 13. However, your caregiver may recommend screening at an earlier age if you have risk factors for colon cancer. On a yearly basis, your caregiver may provide home test kits to check for hidden blood in the stool. Use of a small camera at  the end of a tube, to directly examine the colon (sigmoidoscopy or colonoscopy), can detect the earliest forms of colorectal cancer. Talk to your caregiver about this at age 39, when routine screening begins. Direct examination of the colon should be repeated every 5 to 10 years through age 49, unless early forms of pre-cancerous polyps or small growths are found.  Hepatitis C blood testing is recommended for all people born from [birth year] through 1965 and any individual with known risks for hepatitis C.  Practice safe sex. Use condoms and avoid high-risk sexual practices to reduce the spread of sexually transmitted infections (STIs). STIs include gonorrhea, chlamydia, syphilis, trichomonas, herpes, HPV, and human immunodeficiency virus (HIV). Herpes, HIV, and HPV are viral illnesses that have no cure. They can result in disability, cancer, and death. Sexually active women aged 31 and younger should be checked for chlamydia. Older women with new or multiple partners should also be tested for chlamydia. Testing for other STIs is recommended if you are sexually active and at increased risk.  Osteoporosis is a disease in which the bones lose minerals and strength with aging. This can result in serious bone fractures. The risk of osteoporosis can be identified using a bone density scan. Women ages 9 and over and women at risk for fractures or osteoporosis should discuss screening with their caregivers. Ask your caregiver whether you should take a calcium supplement or vitamin D to reduce the rate of osteoporosis.  Menopause can be associated with physical symptoms and risks. Hormone replacement therapy is available to decrease symptoms and risks. You should talk to your caregiver about whether hormone replacement therapy is right for you.  Use sunscreen with sun protection factor (SPF) of 30 or more. Apply sunscreen liberally and repeatedly throughout the day. You should seek shade when your shadow is shorter  than you. Protect yourself by wearing long sleeves, pants, a wide-brimmed hat, and sunglasses year round, whenever you are outdoors.  Once a month, do a whole body skin exam, using a mirror to look at the skin on your back. Notify your caregiver of new moles, moles that have irregular borders, moles that are larger than a pencil eraser, or moles that have changed in shape or color.  Stay current with required immunizations.  Influenza. You need a dose every fall (or winter). The composition of the flu vaccine changes each year, so being vaccinated once is not enough.  Pneumococcal polysaccharide. You need 1 to 2 doses if you smoke cigarettes or if you have certain chronic medical conditions. You need 1 dose at age 41 (or older) if you have never been vaccinated.  Tetanus, diphtheria, pertussis (Tdap, Td). Get 1 dose of Tdap vaccine if you are younger than age 74, are over 56 and have contact with an infant, are a Research scientist (physical sciences), are pregnant, or simply want to be  protected from whooping cough. After that, you need a Td booster dose every 10 years. Consult your caregiver if you have not had at least 3 tetanus and diphtheria-containing shots sometime in your life or have a deep or dirty wound.  HPV. You need this vaccine if you are a woman age 60 or younger. The vaccine is given in 3 doses over 6 months.  Measles, mumps, rubella (MMR). You need at least 1 dose of MMR if you were born in 1957 or later. You may also need a second dose.  Meningococcal. If you are age 87 to 29 and a first-year college student living in a residence hall, or have one of several medical conditions, you need to get vaccinated against meningococcal disease. You may also need additional booster doses.  Zoster (shingles). If you are age 57 or older, you should get this vaccine.  Varicella (chickenpox). If you have never had chickenpox or you were vaccinated but received only 1 dose, talk to your caregiver to find out if  you need this vaccine.  Hepatitis A. You need this vaccine if you have a specific risk factor for hepatitis A virus infection or you simply wish to be protected from this disease. The vaccine is usually given as 2 doses, 6 to 18 months apart.  Hepatitis B. You need this vaccine if you have a specific risk factor for hepatitis B virus infection or you simply wish to be protected from this disease. The vaccine is given in 3 doses, usually over 6 months. Preventive Services / Frequency Ages 20 to 76  Blood pressure check.** / Every 1 to 2 years.  Lipid and cholesterol check.** / Every 5 years beginning at age 28.  Clinical breast exam.** / Every 3 years for women in their 77s and 30s.  Pap test.** / Every 2 years from ages 36 through 88. Every 3 years starting at age 64 through age 32 or 4 with a history of 3 consecutive normal Pap tests.  HPV screening.** / Every 3 years from ages 68 through ages 13 to 84 with a history of 3 consecutive normal Pap tests.  Hepatitis C blood test.** / For any individual with known risks for hepatitis C.  Skin self-exam. / Monthly.  Influenza immunization.** / Every year.  Pneumococcal polysaccharide immunization.** / 1 to 2 doses if you smoke cigarettes or if you have certain chronic medical conditions.  Tetanus, diphtheria, pertussis (Tdap, Td) immunization. / A one-time dose of Tdap vaccine. After that, you need a Td booster dose every 10 years.  HPV immunization. / 3 doses over 6 months, if you are 32 and younger.  Measles, mumps, rubella (MMR) immunization. / You need at least 1 dose of MMR if you were born in 1957 or later. You may also need a second dose.  Meningococcal immunization. / 1 dose if you are age 27 to 22 and a first-year college student living in a residence hall, or have one of several medical conditions, you need to get vaccinated against meningococcal disease. You may also need additional booster doses.  Varicella immunization.** /  Consult your caregiver.  Hepatitis A immunization.** / Consult your caregiver. 2 doses, 6 to 18 months apart.  Hepatitis B immunization.** / Consult your caregiver. 3 doses usually over 6 months. Ages 22 to 74  Blood pressure check.** / Every 1 to 2 years.  Lipid and cholesterol check.** / Every 5 years beginning at age 81.  Clinical breast exam.** / Every year after  age 41.  Mammogram.** / Every year beginning at age 87 and continuing for as long as you are in good health. Consult with your caregiver.  Pap test.** / Every 3 years starting at age 89 through age 30 or 28 with a history of 3 consecutive normal Pap tests.  HPV screening.** / Every 3 years from ages 68 through ages 42 to 34 with a history of 3 consecutive normal Pap tests.  Fecal occult blood test (FOBT) of stool. / Every year beginning at age 22 and continuing until age 43. You may not need to do this test if you get a colonoscopy every 10 years.  Flexible sigmoidoscopy or colonoscopy.** / Every 5 years for a flexible sigmoidoscopy or every 10 years for a colonoscopy beginning at age 1 and continuing until age 19.  Hepatitis C blood test.** / For all people born from [birth year] through 1964 and any individual with known risks for hepatitis C.  Skin self-exam. / Monthly.  Influenza immunization.** / Every year.  Pneumococcal polysaccharide immunization.** / 1 to 2 doses if you smoke cigarettes or if you have certain chronic medical conditions.  Tetanus, diphtheria, pertussis (Tdap, Td) immunization.** / A one-time dose of Tdap vaccine. After that, you need a Td booster dose every 10 years.  Measles, mumps, rubella (MMR) immunization. / You need at least 1 dose of MMR if you were born in 1957 or later. You may also need a second dose.  Varicella immunization.** / Consult your caregiver.  Meningococcal immunization.** / Consult your caregiver.  Hepatitis A immunization.** / Consult your caregiver. 2 doses, 6 to 18 months  apart.  Hepatitis B immunization.** / Consult your caregiver. 3 doses, usually over 6 months. Ages 38 and over  Blood pressure check.** / Every 1 to 2 years.  Lipid and cholesterol check.** / Every 5 years beginning at age 40.  Clinical breast exam.** / Every year after age 3.  Mammogram.** / Every year beginning at age 32 and continuing for as long as you are in good health. Consult with your caregiver.  Pap test.** / Every 3 years starting at age 69 through age 68 or 76 with a 3 consecutive normal Pap tests. Testing can be stopped between 65 and 70 with 3 consecutive normal Pap tests and no abnormal Pap or HPV tests in the past 10 years.  HPV screening.** / Every 3 years from ages 13 through ages 41 or 39 with a history of 3 consecutive normal Pap tests. Testing can be stopped between 65 and 70 with 3 consecutive normal Pap tests and no abnormal Pap or HPV tests in the past 10 years.  Fecal occult blood test (FOBT) of stool. / Every year beginning at age 24 and continuing until age 59. You may not need to do this test if you get a colonoscopy every 10 years.  Flexible sigmoidoscopy or colonoscopy.** / Every 5 years for a flexible sigmoidoscopy or every 10 years for a colonoscopy beginning at age 67 and continuing until age 33.  Hepatitis C blood test.** / For all people born from [birth year] through 1964 and any individual with known risks for hepatitis C.  Osteoporosis screening.** / A one-time screening for women ages 81 and over and women at risk for fractures or osteoporosis.  Skin self-exam. / Monthly.  Influenza immunization.** / Every year.  Pneumococcal polysaccharide immunization.** / 1 dose at age 23 (or older) if you have never been vaccinated.  Tetanus, diphtheria, pertussis (Tdap, Td) immunization. /  A one-time dose of Tdap vaccine if you are over 65 and have contact with an infant, are a Research scientist (physical sciences), or simply want to be protected from whooping cough. After that, you  need a Td booster dose every 10 years.  Varicella immunization.** / Consult your caregiver.  Meningococcal immunization.** / Consult your caregiver.  Hepatitis A immunization.** / Consult your caregiver. 2 doses, 6 to 18 months apart.  Hepatitis B immunization.** / Check with your caregiver. 3 doses, usually over 6 months. ** Family history and personal history of risk and conditions may change your caregiver's recommendations. Document Released: 07/05/2001 Document Revised: 08/01/2011 Document Reviewed: 10/04/2010 [facility] Patient Information 2013 [place], [place].      Neta Mends. Panosh M.D.

## 2012-04-05 ENCOUNTER — Encounter: Payer: Self-pay | Admitting: Internal Medicine

## 2012-04-25 ENCOUNTER — Encounter: Payer: Self-pay | Admitting: Internal Medicine

## 2012-05-12 ENCOUNTER — Other Ambulatory Visit: Payer: Self-pay | Admitting: Internal Medicine

## 2012-05-15 NOTE — Telephone Encounter (Signed)
Hydrocodone last filled 03-22-12, #15 with 0 refills

## 2012-05-17 ENCOUNTER — Telehealth: Payer: Self-pay | Admitting: Family Medicine

## 2012-05-17 NOTE — Telephone Encounter (Signed)
Pt is requesting refills from [facility] on [address].  She was last seen on 04/03/12 for physical.   She has no future appt scheduled.  Last filled on 03/23/12 #15 with 0 additional refills.  Please advise.  Thanks!!

## 2012-05-17 NOTE — Telephone Encounter (Signed)
Called to the pharmacy and left on voicemail. 

## 2012-05-17 NOTE — Telephone Encounter (Signed)
Ok to refill x 1  

## 2012-07-26 ENCOUNTER — Other Ambulatory Visit: Payer: Self-pay | Admitting: Internal Medicine

## 2012-08-20 ENCOUNTER — Other Ambulatory Visit: Payer: Self-pay | Admitting: Internal Medicine

## 2012-11-12 ENCOUNTER — Ambulatory Visit (INDEPENDENT_AMBULATORY_CARE_PROVIDER_SITE_OTHER): Payer: BC Managed Care – PPO | Admitting: Internal Medicine

## 2012-11-12 ENCOUNTER — Encounter: Payer: Self-pay | Admitting: Internal Medicine

## 2012-11-12 VITALS — BP 114/80 | HR 63 | Temp 98.5°F | Wt 170.0 lb

## 2012-11-12 DIAGNOSIS — L293 Anogenital pruritus, unspecified: Secondary | ICD-10-CM

## 2012-11-12 DIAGNOSIS — R350 Frequency of micturition: Secondary | ICD-10-CM

## 2012-11-12 DIAGNOSIS — N898 Other specified noninflammatory disorders of vagina: Secondary | ICD-10-CM

## 2012-11-12 LAB — POCT URINALYSIS DIPSTICK
Bilirubin, UA: NEGATIVE
Blood, UA: NEGATIVE
Glucose, UA: NEGATIVE
Ketones, UA: NEGATIVE
Nitrite, UA: NEGATIVE
Protein, UA: NEGATIVE
Spec Grav, UA: 1.025
Urobilinogen, UA: 0.2
pH, UA: 5.5

## 2012-11-12 MED ORDER — FLUCONAZOLE 150 MG PO TABS: 150.0000 mg | ORAL_TABLET | Freq: Once | ORAL | Status: DC

## 2012-11-12 MED ORDER — NITROFURANTOIN MONOHYD MACRO 100 MG PO CAPS: 100.0000 mg | ORAL_CAPSULE | Freq: Two times a day (BID) | ORAL | Status: DC

## 2012-11-12 NOTE — Progress Notes (Signed)
Chief Complaint  Patient presents with  . Urinary Frequency  . Vaginal Itching    HPI: Patient comes in today for SDA for  new problem evaluation. Last uti  Was about 2 year ago . Has been treated by her gynecologist. Over yesterday she had sudden onset of increased frequency urgency about 6 times last night. No fever or burning per se did have some vaginal itching today without discharge like a yeast infection. She usually doesn't have a lot of pain with her UTIs but has been treated in the past by her gynecologist. No fever chills or flank pain today.  ROS: See pertinent positives and negatives per HPI.  Past Medical History  Diagnosis Date  . EXTERNAL HEMORRHOIDS 02/18/2010  . VARICOSE VEIN 01/06/2010  . GERD 03/13/2007  . HIP PAIN, RIGHT 02/23/2009  . LOW BACK PAIN, MILD 08/06/2008  . FATIGUE 04/13/2007  . PARESTHESIA 03/13/2007  . Headache(784.0) 03/13/2007  . Abdominal pain, epigastric 01/06/2010  . LIVER FUNCTION TESTS, ABNORMAL 01/06/2010  . ESOPHAGITIS, HX OF 02/18/2010  . Pulmonary nodule     incidental on ct 2007  neg cxray 2011  . Pancreatitis 12/18/09  . Chronic headaches   . Esophagitis   . Gallstones     Family History  Problem Relation Age of Onset  . Alcohol abuse Father   . Cardiomyopathy Father   . Anemia Father   . Heart disease Father   . Other Father     Irrg. Heart Beat  . Lung cancer Mother   . Diabetes Mother   . Other Brother     Irr. Heart Beat  . Pancreatitis Maternal Grandfather   . Colon cancer Maternal Grandmother     History   Social History  . Marital Status: Married    Spouse Name: N/A    Number of Children: 0  . Years of Education: N/A   Occupational History  . PHARMACIST    Social History Main Topics  . Smoking status: Never Smoker   . Smokeless tobacco: Never Used  . Alcohol Use: Yes     Comment: 2 per month  . Drug Use: No  . Sexually Active: None   Other Topics Concern  . None   Social History Narrative   Dogs 2      married is a Teacher, early years/pre never smoked.    hx fertility treatment    hh of 2   6- 8 hours  Sleep    3 diet coke per day   2 etoh per month                       Outpatient Encounter Prescriptions as of 11/12/2012  Medication Sig Dispense Refill  . dexlansoprazole (DEXILANT) 60 MG capsule Take 1 capsule (60 mg total) by mouth daily.  30 capsule  5  . HYDROcodone-acetaminophen (NORCO/VICODIN) 5-325 MG per tablet TAKE 1 TABLET EVERY 4 HOURS AS NEEDED FOR PAIN  15 tablet  0  . ibuprofen (ADVIL,MOTRIN) 200 MG tablet Take 200 mg by mouth every 6 (six) hours as needed.        . promethazine (PHENERGAN) 25 MG tablet take 1 tablet by mouth every 4 to 6 hours if needed for nausea and vomiting  20 tablet  2  . SUMAtriptan (IMITREX) 100 MG tablet take 1 tablet by mouth if needed for headache (MAY REPEAT IN 2 HOURS IF NEEDED)  9 tablet  2  . fluconazole (DIFLUCAN) 150 MG tablet Take 1  tablet (150 mg total) by mouth once.  1 tablet  1  . nitrofurantoin, macrocrystal-monohydrate, (MACROBID) 100 MG capsule Take 1 capsule (100 mg total) by mouth 2 (two) times daily.  10 capsule  0   No facility-administered encounter medications on file as of 11/12/2012.    EXAM:  BP 114/80  Pulse 63  Temp(Src) 98.5 F (36.9 C) (Oral)  Wt 170 lb (77.111 kg)  BMI 26.43 kg/m2  SpO2 99%  LMP 10/26/2012  Body mass index is 26.43 kg/(m^2).  GENERAL: vitals reviewed and listed above, alert, oriented, appears well hydrated and in no acute distress  HEENT: atraumatic, conjunctiva  clear, no obvious abnormalities on inspection of external nose and ears  NECK: no obvious masses on inspection palpation   CV: HRRR, no clubbing cyanosis Abdomen:  Sof,t normal bowel sounds without hepatosplenomegaly, no guarding rebound or masses no CVA tenderness MS: moves all extremities without noticeable focal  abnormality PSYCH: pleasant and cooperative, no obvious depression or anxiety ua trc leuk  ASSESSMENT AND  PLAN:  Discussed the following assessment and plan:  Urinary frequency - hx of utis do UCX and rx cover fore yeast  also  - Plan: POC Urinalysis Dipstick, Culture, Urine  Vagina itching - mild   -Patient advised to return or notify health care team  if symptoms worsen or persist or new concerns arise.  Patient Instructions  Will notify you  of culture  when available. Begin antibiotic and  Diflucan as discussed     Neta Mends. Kyrie Fludd M.D.

## 2012-11-12 NOTE — Patient Instructions (Signed)
Will notify you  of culture  when available. Begin antibiotic and  Diflucan as discussed

## 2012-11-13 LAB — URINE CULTURE: Colony Count: 30000

## 2012-11-15 NOTE — Progress Notes (Signed)
Quick Note:  Tell pt urine culture showed multiple bacteria but not predominant germ. Cannot tell if she has UTI. But take the medicine and contact us if not getting better . ______

## 2012-12-14 ENCOUNTER — Other Ambulatory Visit: Payer: Self-pay | Admitting: Internal Medicine

## 2012-12-14 NOTE — Telephone Encounter (Signed)
Last filled on 08/20/12 #15 with 0 additional refills Seen acutely on 6/23/*14 Had CPE on 04/03/12 No future appt. Please advise. Thanks!

## 2012-12-14 NOTE — Telephone Encounter (Signed)
refillx1

## 2013-01-29 ENCOUNTER — Other Ambulatory Visit: Payer: Self-pay | Admitting: Internal Medicine

## 2013-01-31 NOTE — Telephone Encounter (Signed)
Last filled on 12/14/12 #15 with 0 additional refills Seen in acute visit on 11/12/12 Has no fu scheduled. Please advise. Thanks!

## 2013-02-05 NOTE — Telephone Encounter (Signed)
Ok to refill hydrocodone x 1 and sumatriptan x 3

## 2013-02-07 ENCOUNTER — Encounter (HOSPITAL_COMMUNITY): Payer: Self-pay | Admitting: *Deleted

## 2013-02-07 DIAGNOSIS — G8929 Other chronic pain: Secondary | ICD-10-CM | POA: Insufficient documentation

## 2013-02-07 DIAGNOSIS — N390 Urinary tract infection, site not specified: Secondary | ICD-10-CM | POA: Insufficient documentation

## 2013-02-07 DIAGNOSIS — Z880 Allergy status to penicillin: Secondary | ICD-10-CM | POA: Insufficient documentation

## 2013-02-07 DIAGNOSIS — R51 Headache: Secondary | ICD-10-CM | POA: Insufficient documentation

## 2013-02-07 DIAGNOSIS — R05 Cough: Secondary | ICD-10-CM | POA: Insufficient documentation

## 2013-02-07 DIAGNOSIS — K219 Gastro-esophageal reflux disease without esophagitis: Secondary | ICD-10-CM | POA: Insufficient documentation

## 2013-02-07 DIAGNOSIS — R059 Cough, unspecified: Secondary | ICD-10-CM | POA: Insufficient documentation

## 2013-02-07 DIAGNOSIS — Z8679 Personal history of other diseases of the circulatory system: Secondary | ICD-10-CM | POA: Insufficient documentation

## 2013-02-07 DIAGNOSIS — J029 Acute pharyngitis, unspecified: Secondary | ICD-10-CM | POA: Insufficient documentation

## 2013-02-07 DIAGNOSIS — Z8709 Personal history of other diseases of the respiratory system: Secondary | ICD-10-CM | POA: Insufficient documentation

## 2013-02-07 DIAGNOSIS — R509 Fever, unspecified: Secondary | ICD-10-CM | POA: Insufficient documentation

## 2013-02-07 DIAGNOSIS — Z79899 Other long term (current) drug therapy: Secondary | ICD-10-CM | POA: Insufficient documentation

## 2013-02-07 DIAGNOSIS — M542 Cervicalgia: Secondary | ICD-10-CM | POA: Insufficient documentation

## 2013-02-07 NOTE — ED Notes (Signed)
Having a migraine. Took Imitrex and advil this morning which helped some until 1400. Currently, moderate migraine that is now unrelieved by Imitrex and advil.

## 2013-02-08 ENCOUNTER — Emergency Department (HOSPITAL_COMMUNITY)
Admission: EM | Admit: 2013-02-08 | Discharge: 2013-02-08 | Disposition: A | Discharge status: Home / Self Care | Payer: BC Managed Care – PPO | Attending: Emergency Medicine | Admitting: Emergency Medicine

## 2013-02-08 DIAGNOSIS — R519 Headache, unspecified: Secondary | ICD-10-CM

## 2013-02-08 DIAGNOSIS — R509 Fever, unspecified: Secondary | ICD-10-CM

## 2013-02-08 DIAGNOSIS — N390 Urinary tract infection, site not specified: Secondary | ICD-10-CM

## 2013-02-08 LAB — URINE MICROSCOPIC-ADD ON

## 2013-02-08 LAB — URINALYSIS, ROUTINE W REFLEX MICROSCOPIC
Bilirubin Urine: NEGATIVE
Glucose, UA: NEGATIVE mg/dL
Hgb urine dipstick: NEGATIVE
Ketones, ur: >80 mg/dL — AB
Nitrite: NEGATIVE
Protein, ur: NEGATIVE mg/dL
Specific Gravity, Urine: 1.016 (ref 1.005–1.030)
Urobilinogen, UA: 0.2 mg/dL (ref 0.0–1.0)
pH: 5 (ref 5.0–8.0)

## 2013-02-08 MED ORDER — ACETAMINOPHEN 325 MG PO TABS
650.0000 mg | ORAL_TABLET | Freq: Once | ORAL | Status: AC
  Administered 2013-02-08: 650 mg via ORAL
  Filled 2013-02-08: qty 2

## 2013-02-08 MED ORDER — CIPROFLOXACIN HCL 500 MG PO TABS: 500.0000 mg | ORAL_TABLET | Freq: Once | ORAL | Status: DC

## 2013-02-08 MED ORDER — METOCLOPRAMIDE HCL 5 MG/ML IJ SOLN
10.0000 mg | Freq: Once | INTRAMUSCULAR | Status: AC
  Administered 2013-02-08: 10 mg via INTRAVENOUS
  Filled 2013-02-08: qty 2

## 2013-02-08 MED ORDER — LIDOCAINE HCL (PF) 1 % IJ SOLN
INTRAMUSCULAR | Status: AC
  Administered 2013-02-08: 2 mL
  Filled 2013-02-08: qty 5

## 2013-02-08 MED ORDER — DIPHENHYDRAMINE HCL 50 MG/ML IJ SOLN
25.0000 mg | Freq: Once | INTRAMUSCULAR | Status: AC
  Administered 2013-02-08: 25 mg via INTRAVENOUS
  Filled 2013-02-08: qty 1

## 2013-02-08 MED ORDER — DEXAMETHASONE SODIUM PHOSPHATE 4 MG/ML IJ SOLN
10.0000 mg | Freq: Once | INTRAMUSCULAR | Status: AC
  Administered 2013-02-08: 10 mg via INTRAVENOUS
  Filled 2013-02-08: qty 3

## 2013-02-08 MED ORDER — CEFPODOXIME PROXETIL 100 MG PO TABS: 100.0000 mg | ORAL_TABLET | Freq: Two times a day (BID) | ORAL | Status: DC

## 2013-02-08 MED ORDER — DEXTROSE 5 % IV SOLN: 1.0000 g | Freq: Once | INTRAVENOUS | Status: DC

## 2013-02-08 MED ORDER — CEFTRIAXONE SODIUM 1 G IJ SOLR
1.0000 g | Freq: Once | INTRAMUSCULAR | Status: AC
  Administered 2013-02-08: 1 g via INTRAMUSCULAR
  Filled 2013-02-08: qty 10

## 2013-02-08 MED ORDER — SODIUM CHLORIDE 0.9 % IV BOLUS (SEPSIS)
1000.0000 mL | Freq: Once | INTRAVENOUS | Status: AC
  Administered 2013-02-08: 1000 mL via INTRAVENOUS

## 2013-02-08 NOTE — ED Provider Notes (Signed)
CSN: [id number]     Arrival date & time 02/07/13  2119 History   First MD Initiated Contact with Patient 02/08/13 0034     Chief Complaint  Patient presents with  . Migraine   (Consider location/radiation/quality/duration/timing/severity/associated sxs/prior Treatment) HPI History provided by patient. Onset 4 days ago of fever with dry cough and sore throat. Cough and sore throat have improved. Fever lasted 2 days. Yesterday developed headache and felt like typical headache for her. She took Imitrex without relief. She presents tonight with bilateral temporal headache but does radiate to her neck. No neck stiffness. Patient unaware of fever / noted elevated temperature in triage.  No known sick contacts. No recent travel. No known tick exposure. No rashes. No abdominal pain. No diarrhea. No chest pain or difficulty breathing. No productive cough. No congestion or runny nose. Symptoms moderate to severe. Headache is throbbing in quality and worse when she stands up.  Past Medical History  Diagnosis Date  . EXTERNAL HEMORRHOIDS 02/18/2010  . VARICOSE VEIN 01/06/2010  . GERD 03/13/2007  . HIP PAIN, RIGHT 02/23/2009  . LOW BACK PAIN, MILD 08/06/2008  . FATIGUE 04/13/2007  . PARESTHESIA 03/13/2007  . Headache(784.0) 03/13/2007  . Abdominal pain, epigastric 01/06/2010  . LIVER FUNCTION TESTS, ABNORMAL 01/06/2010  . ESOPHAGITIS, HX OF 02/18/2010  . Pulmonary nodule     incidental on ct 2007  neg cxray 2011  . Pancreatitis 12/18/09  . Chronic headaches   . Esophagitis   . Gallstones    Past Surgical History  Procedure Laterality Date  . Cholecystectomy    . Tonsillectomy     Family History  Problem Relation Age of Onset  . Alcohol abuse Father   . Cardiomyopathy Father   . Anemia Father   . Heart disease Father   . Other Father     Irrg. Heart Beat  . Lung cancer Mother   . Diabetes Mother   . Other Brother     Irr. Heart Beat  . Pancreatitis Maternal Grandfather   . Colon cancer  Maternal Grandmother    History  Substance Use Topics  . Smoking status: Never Smoker   . Smokeless tobacco: Never Used  . Alcohol Use: Yes     Comment: 2 per month   OB History   Grav Para Term Preterm Abortions TAB SAB Ect Mult Living                 Review of Systems  Constitutional: Positive for fever.  HENT: Positive for neck pain. Negative for neck stiffness.   Eyes: Negative for visual disturbance.  Respiratory: Negative for shortness of breath.   Cardiovascular: Negative for chest pain.  Gastrointestinal: Negative for abdominal pain.  Genitourinary: Negative for dysuria.  Musculoskeletal: Negative for back pain.  Skin: Negative for rash.  Neurological: Positive for headaches. Negative for syncope and speech difficulty.  All other systems reviewed and are negative.    Allergies  Gadolinium; Penicillins; and Sulfonamide derivatives  Home Medications   Current Outpatient Rx  Name  Route  Sig  Dispense  Refill  . dexlansoprazole (DEXILANT) 60 MG capsule   Oral   Take 1 capsule (60 mg total) by mouth daily.   30 capsule   5   . HYDROcodone-acetaminophen (NORCO/VICODIN) 5-325 MG per tablet   Oral   Take 1 tablet by mouth every 4 (four) hours as needed for pain.         Marland Kitchen ibuprofen (ADVIL,MOTRIN) 200 MG tablet   Oral  Take 400 mg by mouth every 6 (six) hours as needed.          . SUMAtriptan (IMITREX) 100 MG tablet   Oral   Take 100 mg by mouth every 2 (two) hours as needed for migraine. May repeat in 2 hours if headache persists or recurs.         . cefpodoxime (VANTIN) 100 MG tablet   Oral   Take 1 tablet (100 mg total) by mouth 2 (two) times daily.   14 tablet   0    BP 109/54  Pulse 89  Temp(Src) 100.2 F (37.9 C) (Oral)  Resp 16  SpO2 98%  LMP 01/24/2013 Physical Exam  Constitutional: She is oriented to person, place, and time. She appears well-developed and well-nourished.  HENT:  Head: Normocephalic and atraumatic.  Mouth/Throat:  Oropharynx is clear and moist. No oropharyngeal exudate.  Eyes: EOM are normal. Pupils are equal, round, and reactive to light.  Neck: Normal range of motion. Neck supple.  Cardiovascular: Normal rate, regular rhythm and intact distal pulses.   Pulmonary/Chest: Effort normal and breath sounds normal. No stridor. No respiratory distress.  Abdominal: Soft. There is no tenderness.  Musculoskeletal: Normal range of motion. She exhibits no edema.  Lymphadenopathy:    She has no cervical adenopathy.  Neurological: She is alert and oriented to person, place, and time. No cranial nerve deficit. Coordination normal.  Skin: Skin is warm and dry.    ED Course  Procedures (including critical care time) Labs Review Labs Reviewed  URINALYSIS, ROUTINE W REFLEX MICROSCOPIC - Abnormal; Notable for the following:    APPearance CLOUDY (*)    Ketones, ur >80 (*)    Leukocytes, UA SMALL (*)    All other components within normal limits  URINE MICROSCOPIC-ADD ON - Abnormal; Notable for the following:    Squamous Epithelial / LPF FEW (*)    Bacteria, UA MANY (*)    All other components within normal limits  URINE CULTURE   IV fluids and IV headache cocktail provided: Benadryl, Decadron, Reglan Tylenol for fever  4:14 AM on recheck headache much improved patient feels comfortable to plan discharge home. Urinalysis reviewed and urine cultures pending. IV Rocephin provided - patient relates that she can take cephalosporins with known penicillin allergy. Plan discharge home with close primary care followup. Prescription for Vantin provided. Patient agrees to Tylenol and Motrin and lots of fluids.  Patient agrees to followup sooner for any new symptoms or concerning condition.   MDM   1. Fever   2. UTI (lower urinary tract infection)   3. Headache    Fever x4 days associated headache and neck pain without meningismus. Urinalysis shows possible UTI.  I discussed with patient that I do not feel a lumbar  puncture is indicated given 4 days of fever and no nuchal rigidity.   Improved with medications. Vital signs and nursing notes reviewed and considered    Sunnie Nielsen, MD 02/08/13 [phone]

## 2013-02-09 LAB — URINE CULTURE: Colony Count: 80000

## 2013-02-10 ENCOUNTER — Encounter (HOSPITAL_COMMUNITY): Payer: Self-pay | Admitting: Emergency Medicine

## 2013-02-10 ENCOUNTER — Emergency Department (INDEPENDENT_AMBULATORY_CARE_PROVIDER_SITE_OTHER)
Admission: EM | Admit: 2013-02-10 | Discharge: 2013-02-10 | Disposition: A | Discharge status: Home / Self Care | Payer: BC Managed Care – PPO | Source: Home / Self Care

## 2013-02-10 DIAGNOSIS — N390 Urinary tract infection, site not specified: Secondary | ICD-10-CM

## 2013-02-10 DIAGNOSIS — G43709 Chronic migraine without aura, not intractable, without status migrainosus: Secondary | ICD-10-CM

## 2013-02-10 MED ORDER — PREDNISONE 10 MG PO TABS: ORAL_TABLET | ORAL | Status: DC

## 2013-02-10 NOTE — ED Notes (Signed)
Headache and neck pain, recently treated for uti.  Headache onset thursday

## 2013-02-10 NOTE — ED Provider Notes (Signed)
CSN: [id number]     Arrival date & time 02/10/13  0901 History   None    Chief Complaint  Patient presents with  . Headache  . Urinary Tract Infection   (Consider location/radiation/quality/duration/timing/severity/associated sxs/prior Treatment) HPI Comments: 47 yo female seen in the ER Thursday for fever 103 HA/ UTI started on antibiotics and given HA cocktail. No blood was drawn or scans performed only UA. Patient noted HA was 10/10 at that time and cocktail helped to relieve symptoms somewhat. Today HA 6/10 still with low grade fever/ sweats/ mild Left back tenderness.   Patient is a 47 y.o. female presenting with headaches and urinary tract infection.  Headache Associated symptoms: cough and fever   Associated symptoms: no neck pain and no neck stiffness   Urinary Tract Infection Associated symptoms include headaches.    Past Medical History  Diagnosis Date  . EXTERNAL HEMORRHOIDS 02/18/2010  . VARICOSE VEIN 01/06/2010  . GERD 03/13/2007  . HIP PAIN, RIGHT 02/23/2009  . LOW BACK PAIN, MILD 08/06/2008  . FATIGUE 04/13/2007  . PARESTHESIA 03/13/2007  . Headache(784.0) 03/13/2007  . Abdominal pain, epigastric 01/06/2010  . LIVER FUNCTION TESTS, ABNORMAL 01/06/2010  . ESOPHAGITIS, HX OF 02/18/2010  . Pulmonary nodule     incidental on ct 2007  neg cxray 2011  . Pancreatitis 12/18/09  . Chronic headaches   . Esophagitis   . Gallstones    Past Surgical History  Procedure Laterality Date  . Cholecystectomy    . Tonsillectomy    . Tonsillectomy     Family History  Problem Relation Age of Onset  . Alcohol abuse Father   . Cardiomyopathy Father   . Anemia Father   . Heart disease Father   . Other Father     Irrg. Heart Beat  . Lung cancer Mother   . Diabetes Mother   . Other Brother     Irr. Heart Beat  . Pancreatitis Maternal Grandfather   . Colon cancer Maternal Grandmother    History  Substance Use Topics  . Smoking status: Never Smoker   . Smokeless tobacco:  Never Used  . Alcohol Use: No     Comment: 2 per month   OB History   Grav Para Term Preterm Abortions TAB SAB Ect Mult Living                 Review of Systems  Constitutional: Positive for fever.  HENT: Negative for neck pain and neck stiffness.   Eyes: Negative.   Respiratory: Positive for cough.   Cardiovascular: Negative.   Gastrointestinal: Negative.   Genitourinary: Positive for frequency.  Musculoskeletal: Negative.   Neurological: Positive for headaches.  Psychiatric/Behavioral: Negative.     Allergies  Gadolinium; Penicillins; and Sulfonamide derivatives  Home Medications   Current Outpatient Rx  Name  Route  Sig  Dispense  Refill  . cefpodoxime (VANTIN) 100 MG tablet   Oral   Take 1 tablet (100 mg total) by mouth 2 (two) times daily.   14 tablet   0   . dexlansoprazole (DEXILANT) 60 MG capsule   Oral   Take 1 capsule (60 mg total) by mouth daily.   30 capsule   5   . HYDROcodone-acetaminophen (NORCO/VICODIN) 5-325 MG per tablet   Oral   Take 1 tablet by mouth every 4 (four) hours as needed for pain.         Marland Kitchen ibuprofen (ADVIL,MOTRIN) 200 MG tablet   Oral   Take 400 mg  by mouth every 6 (six) hours as needed.          . SUMAtriptan (IMITREX) 100 MG tablet   Oral   Take 100 mg by mouth every 2 (two) hours as needed for migraine. May repeat in 2 hours if headache persists or recurs.          BP 129/85  Pulse 70  Temp(Src) 98.9 F (37.2 C) (Oral)  Resp 18  SpO2 99%  LMP 01/24/2013 Physical Exam  Nursing note and vitals reviewed. Constitutional: She is oriented to person, place, and time. She appears well-developed and well-nourished.  HENT:  Head: Normocephalic and atraumatic.  Right Ear: External ear normal.  Left Ear: External ear normal.  Mouth/Throat: Oropharynx is clear and moist.  Eyes: Conjunctivae and EOM are normal. Pupils are equal, round, and reactive to light.  Neck: Normal range of motion. Neck supple.  Cardiovascular:  Normal rate, regular rhythm, normal heart sounds and intact distal pulses.   Pulmonary/Chest: Effort normal and breath sounds normal.  Abdominal: Soft. Bowel sounds are normal.  Musculoskeletal: Normal range of motion.  Lymphadenopathy:    She has no cervical adenopathy.  Neurological: She is alert and oriented to person, place, and time. She has normal reflexes. She exhibits normal muscle tone.  Skin: Skin is warm and dry.  Psychiatric: She has a normal mood and affect. Her behavior is normal. Judgment and thought content normal.    ED Course  Procedures (including critical care time) Labs Review Labs Reviewed - No data to display Imaging Review No results found.  MDM  Probable UTI Advised to continue Antibiotics AD. F/U PCP for C/S in 2 weeks Prednisone 10mg  DP given take AD.  Advised if symptoms do not improve to F/U with PCP if symptoms increase ER. Probable migraine. Advised of possibility of other infectious processes will get evaluation AD if occurs.   Berenice Primas, PA-C 02/10/13 1524

## 2013-02-11 NOTE — ED Provider Notes (Signed)
Medical screening examination/treatment/procedure(s) were performed by a resident physician or non-physician practitioner and as the supervising physician I was immediately available for consultation/collaboration.  Clementeen Graham, MD    Rodolph Bong, MD 02/11/13 [phone]

## 2013-02-13 ENCOUNTER — Ambulatory Visit (INDEPENDENT_AMBULATORY_CARE_PROVIDER_SITE_OTHER): Payer: BC Managed Care – PPO | Admitting: Family

## 2013-02-13 ENCOUNTER — Encounter: Payer: Self-pay | Admitting: Family

## 2013-02-13 VITALS — BP 108/66 | HR 73 | Wt 176.0 lb

## 2013-02-13 DIAGNOSIS — R1012 Left upper quadrant pain: Secondary | ICD-10-CM

## 2013-02-13 DIAGNOSIS — G43909 Migraine, unspecified, not intractable, without status migrainosus: Secondary | ICD-10-CM

## 2013-02-13 LAB — AMYLASE: Amylase: 54 U/L (ref 27–131)

## 2013-02-13 LAB — POCT URINALYSIS DIPSTICK
Bilirubin, UA: NEGATIVE
Blood, UA: NEGATIVE
Glucose, UA: NEGATIVE
Ketones, UA: NEGATIVE
Nitrite, UA: NEGATIVE
Protein, UA: NEGATIVE
Spec Grav, UA: 1.015
Urobilinogen, UA: 0.2
pH, UA: 7

## 2013-02-13 LAB — CBC WITH DIFFERENTIAL/PLATELET
Basophils Absolute: 0.1 10*3/uL (ref 0.0–0.1)
Basophils Relative: 0.5 % (ref 0.0–3.0)
Eosinophils Absolute: 0.1 10*3/uL (ref 0.0–0.7)
Eosinophils Relative: 0.5 % (ref 0.0–5.0)
HCT: 39.5 % (ref 36.0–46.0)
Hemoglobin: 13.3 g/dL (ref 12.0–15.0)
Lymphocytes Relative: 15.3 % (ref 12.0–46.0)
Lymphs Abs: 1.7 10*3/uL (ref 0.7–4.0)
MCHC: 33.6 g/dL (ref 30.0–36.0)
MCV: 93.4 fl (ref 78.0–100.0)
Monocytes Absolute: 0.4 10*3/uL (ref 0.1–1.0)
Monocytes Relative: 3.7 % (ref 3.0–12.0)
Neutro Abs: 9 10*3/uL — ABNORMAL HIGH (ref 1.4–7.7)
Neutrophils Relative %: 80 % — ABNORMAL HIGH (ref 43.0–77.0)
Platelets: 342 10*3/uL (ref 150.0–400.0)
RBC: 4.23 Mil/uL (ref 3.87–5.11)
RDW: 13.2 % (ref 11.5–14.6)
WBC: 11.3 10*3/uL — ABNORMAL HIGH (ref 4.5–10.5)

## 2013-02-13 LAB — LIPASE: Lipase: 30 U/L (ref 11.0–59.0)

## 2013-02-13 NOTE — Patient Instructions (Addendum)

## 2013-02-14 ENCOUNTER — Encounter: Payer: Self-pay | Admitting: Family

## 2013-02-14 NOTE — Progress Notes (Signed)
Subjective:    Patient ID: Debra [name], female    DOB: Apr 15, 1966, 47 y.o.   MRN: [id number]  HPI 47 year old white female, nonsmoker, is in today as a ED follow-up. She was seen on 02/08/2013 for dehydration, fever, headache. Was later seShe feels like her headache is hormonal related as she gets migraine headaches around her menstrual cycle. She has been taking Imitrex that typically helps but just has not this month. She has not consider maintenance therapy in the past because she infrequently gets headaches that are this debilitating. Typically respond well to Imitrex.  Patient has concerns of left upper quadrant pain that occurred yesterday and has subsequently resolved. Pain was about a 5/10 and achy. She has had a history of pancreatitis in the past and like to be screened. No current abdominal pain. No nausea, vomiting, diarrhea or constipation.  As of note, patient recently adopted a newborn.   Review of Systems  Constitutional: Negative.   Respiratory: Negative.   Cardiovascular: Negative.   Gastrointestinal: Negative.   Endocrine: Negative.   Genitourinary: Negative.   Musculoskeletal: Negative.   Skin: Negative.   Allergic/Immunologic: Negative.   Neurological: Positive for headaches. Negative for dizziness and light-headedness.  Hematological: Negative.   Psychiatric/Behavioral: Negative.    Past Medical History  Diagnosis Date  . EXTERNAL HEMORRHOIDS 02/18/2010  . VARICOSE VEIN 01/06/2010  . GERD 03/13/2007  . HIP PAIN, RIGHT 02/23/2009  . LOW BACK PAIN, MILD 08/06/2008  . FATIGUE 04/13/2007  . PARESTHESIA 03/13/2007  . Headache(784.0) 03/13/2007  . Abdominal pain, epigastric 01/06/2010  . LIVER FUNCTION TESTS, ABNORMAL 01/06/2010  . ESOPHAGITIS, HX OF 02/18/2010  . Pulmonary nodule     incidental on ct 2007  neg cxray 2011  . Pancreatitis 12/18/09  . Chronic headaches   . Esophagitis   . Gallstones     History   Social History  . Marital Status: Married     Spouse Name: N/A    Number of Children: 0  . Years of Education: N/A   Occupational History  . PHARMACIST    Social History Main Topics  . Smoking status: Never Smoker   . Smokeless tobacco: Never Used  . Alcohol Use: No     Comment: 2 per month  . Drug Use: No  . Sexual Activity: Not on file   Other Topics Concern  . Not on file   Social History Narrative   Dogs 2     married is a Teacher, early years/pre never smoked.    hx fertility treatment    hh of 2   6- 8 hours  Sleep    3 diet coke per day   2 etoh per month                       Past Surgical History  Procedure Laterality Date  . Cholecystectomy    . Tonsillectomy    . Tonsillectomy      Family History  Problem Relation Age of Onset  . Alcohol abuse Father   . Cardiomyopathy Father   . Anemia Father   . Heart disease Father   . Other Father     Irrg. Heart Beat  . Lung cancer Mother   . Diabetes Mother   . Other Brother     Irr. Heart Beat  . Pancreatitis Maternal Grandfather   . Colon cancer Maternal Grandmother     Allergies  Allergen Reactions  . Gadolinium  Code: HIVES, Desc: Multihance--pt had hives, Onset Date: [id number] IVP Dye  . Penicillins     REACTION: rash  . Sulfonamide Derivatives     REACTION: rash    Current Outpatient Prescriptions on File Prior to Visit  Medication Sig Dispense Refill  . cefpodoxime (VANTIN) 100 MG tablet Take 1 tablet (100 mg total) by mouth 2 (two) times daily.  14 tablet  0  . dexlansoprazole (DEXILANT) 60 MG capsule Take 1 capsule (60 mg total) by mouth daily.  30 capsule  5  . HYDROcodone-acetaminophen (NORCO/VICODIN) 5-325 MG per tablet Take 1 tablet by mouth every 4 (four) hours as needed for pain.      Marland Kitchen ibuprofen (ADVIL,MOTRIN) 200 MG tablet Take 400 mg by mouth every 6 (six) hours as needed.       . predniSONE (DELTASONE) 10 MG tablet 1 Tab po TID x 3 days then 1Tab po BID x 3 days then 1 Tab po QD x 5 days Take with food  20 tablet  0  .  SUMAtriptan (IMITREX) 100 MG tablet Take 100 mg by mouth every 2 (two) hours as needed for migraine. May repeat in 2 hours if headache persists or recurs.       No current facility-administered medications on file prior to visit.    BP 108/66  Pulse 73  Wt 176 lb (79.833 kg)  BMI 27.37 kg/m2  LMP 09/04/2014chart    Objective:   Physical Exam  Constitutional: She is oriented to person, place, and time. She appears well-developed and well-nourished.  HENT:  Right Ear: External ear normal.  Left Ear: External ear normal.  Nose: Nose normal.  Mouth/Throat: Oropharynx is clear and moist.  Eyes: Pupils are equal, round, and reactive to light.  Neck: Normal range of motion. Neck supple.  Cardiovascular: Normal rate, regular rhythm and normal heart sounds.   Pulmonary/Chest: Effort normal and breath sounds normal.  Abdominal: Soft. Bowel sounds are normal.  Musculoskeletal: Normal range of motion.  Neurological: She is alert and oriented to person, place, and time.  Skin: Skin is warm and dry.  Psychiatric: She has a normal mood and affect.          Assessment & Plan:  Assessment: 1. Migraine Headache 2. Left upper Quadrant abdominal pain   Plan: Labs sent to include BMP, LFT, amylase, lipase, will notify patient pending results. Continue current medications. Call if she request maintenance therapy.

## 2013-02-18 ENCOUNTER — Encounter: Payer: Self-pay | Admitting: Internal Medicine

## 2013-02-20 ENCOUNTER — Other Ambulatory Visit: Payer: Self-pay | Admitting: Internal Medicine

## 2013-02-20 ENCOUNTER — Encounter: Payer: Self-pay | Admitting: Internal Medicine

## 2013-02-20 MED ORDER — HYDROCODONE-HOMATROPINE 5-1.5 MG/5ML PO SYRP: 5.0000 mL | ORAL_SOLUTION | Freq: Three times a day (TID) | ORAL | Status: DC | PRN

## 2013-02-20 NOTE — Telephone Encounter (Signed)
See MyChart message

## 2013-02-28 ENCOUNTER — Ambulatory Visit (INDEPENDENT_AMBULATORY_CARE_PROVIDER_SITE_OTHER): Payer: BC Managed Care – PPO | Admitting: Internal Medicine

## 2013-02-28 ENCOUNTER — Encounter: Payer: Self-pay | Admitting: Internal Medicine

## 2013-02-28 VITALS — BP 120/80 | HR 75 | Temp 98.3°F | Wt 169.0 lb

## 2013-02-28 DIAGNOSIS — J069 Acute upper respiratory infection, unspecified: Secondary | ICD-10-CM | POA: Insufficient documentation

## 2013-02-28 DIAGNOSIS — R509 Fever, unspecified: Secondary | ICD-10-CM | POA: Insufficient documentation

## 2013-02-28 DIAGNOSIS — R51 Headache: Secondary | ICD-10-CM

## 2013-02-28 LAB — CBC WITH DIFFERENTIAL/PLATELET
Basophils Absolute: 0 10*3/uL (ref 0.0–0.1)
Basophils Relative: 0.4 % (ref 0.0–3.0)
Eosinophils Absolute: 0.2 10*3/uL (ref 0.0–0.7)
Eosinophils Relative: 2 % (ref 0.0–5.0)
HCT: 39.6 % (ref 36.0–46.0)
Hemoglobin: 13.6 g/dL (ref 12.0–15.0)
Lymphocytes Relative: 26.4 % (ref 12.0–46.0)
Lymphs Abs: 2.5 10*3/uL (ref 0.7–4.0)
MCHC: 34.3 g/dL (ref 30.0–36.0)
MCV: 92.7 fl (ref 78.0–100.0)
Monocytes Absolute: 0.7 10*3/uL (ref 0.1–1.0)
Monocytes Relative: 7.7 % (ref 3.0–12.0)
Neutro Abs: 6 10*3/uL (ref 1.4–7.7)
Neutrophils Relative %: 63.5 % (ref 43.0–77.0)
Platelets: 285 10*3/uL (ref 150.0–400.0)
RBC: 4.27 Mil/uL (ref 3.87–5.11)
RDW: 13.3 % (ref 11.5–14.6)
WBC: 9.4 10*3/uL (ref 4.5–10.5)

## 2013-02-28 LAB — SEDIMENTATION RATE: Sed Rate: 34 mm/hr — ABNORMAL HIGH (ref 0–22)

## 2013-02-28 NOTE — Patient Instructions (Signed)
This still could be a viral illness that is progressing. Because of a history of recurrent fever An upper respiratory congestion plan repeat CBC and head scan to look for sinusitis other causes severe headache.  It is possible that you have a partly treated sinus infection that would require a different antibiotic.  Let you results of the tests we get these back

## 2013-02-28 NOTE — Progress Notes (Signed)
Chief Complaint  Patient presents with  . Follow-up    Still not better.  Continues to have head and neck pain.  Now has sinus problems.    HPI:  Was seen in ed and by other provider and comes in because not better and evolving illness  Has been given z pack 9 17 but only took 2 days and  Vantin. Marland Kitchen  9 19 and she presented to the emergency room with a fever of 102 103 and significant headache sore throat.  Had fever and ha and then didn't develop congestion until later. Has had history of significant coughing throughout this illness to congestion.rx with uti  but culture came back multiple species. Didn't know she had a UTI when she was told this.  She then presented at  Urgent care for continuing headache and fever and even pred for ha.  And tapered.  And then had abd pain  Hx of same. That since resolved.  She calls today for an appointment for reevaluation because nowNow still ill and tired and neck an head  Hurts some againand now ear bothering her  on the left Now.  Left ear for 2 days popping and uncomfortable  Last fever was a week ago at 102 doesn't think she had one yesterday  Basically has never gotten totally  better since illness began   53 month old adopted 1/2 days  Not sick except  Some snotty nose. But no other major illness she is in daycare. ROS: See pertinent positives and negatives per HPI. No current vomiting diarrhea unusual rashes  Past Medical History  Diagnosis Date  . EXTERNAL HEMORRHOIDS 02/18/2010  . VARICOSE VEIN 01/06/2010  . GERD 03/13/2007  . HIP PAIN, RIGHT 02/23/2009  . LOW BACK PAIN, MILD 08/06/2008  . FATIGUE 04/13/2007  . PARESTHESIA 03/13/2007  . Headache(784.0) 03/13/2007  . Abdominal pain, epigastric 01/06/2010  . LIVER FUNCTION TESTS, ABNORMAL 01/06/2010  . ESOPHAGITIS, HX OF 02/18/2010  . Pulmonary nodule     incidental on ct 2007  neg cxray 2011  . Pancreatitis 12/18/09  . Chronic headaches   . Esophagitis   . Gallstones     Family History   Problem Relation Age of Onset  . Alcohol abuse Father   . Cardiomyopathy Father   . Anemia Father   . Heart disease Father   . Other Father     Irrg. Heart Beat  . Lung cancer Mother   . Diabetes Mother   . Other Brother     Irr. Heart Beat  . Pancreatitis Maternal Grandfather   . Colon cancer Maternal Grandmother     History   Social History  . Marital Status: Married    Spouse Name: N/A    Number of Children: 0  . Years of Education: N/A   Occupational History  . PHARMACIST    Social History Main Topics  . Smoking status: Never Smoker   . Smokeless tobacco: Never Used  . Alcohol Use: No     Comment: 2 per month  . Drug Use: No  . Sexual Activity: None   Other Topics Concern  . None   Social History Narrative   Dogs 2     married is a Teacher, early years/pre never smoked.    hx fertility treatment    hh of 2   6- 8 hours  Sleep    3 diet coke per day   2 etoh per month  Outpatient Encounter Prescriptions as of 02/28/2013  Medication Sig Dispense Refill  . dexlansoprazole (DEXILANT) 60 MG capsule Take 1 capsule (60 mg total) by mouth daily.  30 capsule  5  . HYDROcodone-acetaminophen (NORCO/VICODIN) 5-325 MG per tablet take 1 tablet by mouth every 4 hours if needed for pain  15 tablet  0  . HYDROcodone-homatropine (HYCODAN) 5-1.5 MG/5ML syrup Take 5 mLs by mouth every 8 (eight) hours as needed for cough.  120 mL  0  . ibuprofen (ADVIL,MOTRIN) 200 MG tablet Take 400 mg by mouth every 6 (six) hours as needed.       . SUMAtriptan (IMITREX) 100 MG tablet Take 100 mg by mouth every 2 (two) hours as needed for migraine. May repeat in 2 hours if headache persists or recurs.      . [DISCONTINUED] cefpodoxime (VANTIN) 100 MG tablet Take 1 tablet (100 mg total) by mouth 2 (two) times daily.  14 tablet  0  . [DISCONTINUED] predniSONE (DELTASONE) 10 MG tablet 1 Tab po TID x 3 days then 1Tab po BID x 3 days then 1 Tab po QD x 5 days Take with food  20  tablet  0   No facility-administered encounter medications on file as of 02/28/2013.    EXAM:  BP 120/80  Pulse 75  Temp(Src) 98.3 F (36.8 C) (Oral)  Wt 169 lb (76.658 kg)  BMI 26.28 kg/m2  SpO2 99%  LMP 02/15/2013  Body mass index is 26.28 kg/(m^2).  GENERAL: vitals reviewed and listed above, alert, oriented, appears well hydrated and in no acute distress looks congested but nontoxic HEENT: atraumatic, conjunctiva  clear, no obvious abnormalities on inspection of external nose and ears congested but TMs intact no acute changes OP : no lesion edema or exudate tongue is midline NECK: no obvious masses on inspection palpation no adenopathy supple complaints of headache and neck pain LUNGS: clear to auscultation bilaterally, no wheezes, rales or rhonchi, good air movement CV: HRRR, no clubbing cyanosis or  peripheral edema nl cap refill  Abdomen soft without organomegaly guarding or rebound Skin no acute changes MS: moves all extremities without noticeable focal  abnormality Neuro appears nonfocal grossly. PSYCH: pleasant and cooperative, no obvious depression or anxiety Lab Results  Component Value Date   WBC 11.3* 02/13/2013   HGB 13.3 02/13/2013   HCT 39.5 02/13/2013   PLT 342.0 02/13/2013   GLUCOSE 89 03/28/2012   CHOL 200 03/28/2012   TRIG 61.0 03/28/2012   HDL 52.60 03/28/2012   LDLCALC 135* 03/28/2012   ALT 16 03/28/2012   AST 20 03/28/2012   NA 138 03/28/2012   K 4.2 03/28/2012   CL 105 03/28/2012   CREATININE 0.8 03/28/2012   BUN 14 03/28/2012   CO2 27 03/28/2012   TSH 1.09 03/28/2012    ASSESSMENT AND PLAN:  Discussed the following assessment and plan:  Headache(784.0) - Plan: CBC with Differential, Sedimentation rate, CT Head Wo Contrast  Protracted URI - Plan: CBC with Differential, Sedimentation rate, CT Head Wo Contrast  Fever, unspecified - Plan: CBC with Differential, Sedimentation rate, CT Head Wo Contrast This may be a prolonged viral  illness on top of her  baseline headache that has been triggered. However the waxing and waning fever andcontext of situation is concerning  With   Worsening of headaches     fortunately her exam today is non-alarming.  Recheck blood count today ( last time mildy elevation pre prednisone ) and CT scan to check for sinusitis other  causes of headache.  Of note she does have a past history of mono when she was younger.thus will not check this lab.  -Patient advised to return or notify health care team  if symptoms worsen or persist or new concerns arise.  Patient Instructions  This still could be a viral illness that is progressing. Because of a history of recurrent fever An upper respiratory congestion plan repeat CBC and head scan to look for sinusitis other causes severe headache.  It is possible that you have a partly treated sinus infection that would require a different antibiotic.  Let you results of the tests we get these back  Bartley K. Panosh M.D.

## 2013-03-01 ENCOUNTER — Ambulatory Visit (INDEPENDENT_AMBULATORY_CARE_PROVIDER_SITE_OTHER)
Admission: RE | Admit: 2013-03-01 | Discharge: 2013-03-01 | Disposition: A | Discharge status: Home / Self Care | Payer: BC Managed Care – PPO | Source: Ambulatory Visit | Attending: Internal Medicine | Admitting: Internal Medicine

## 2013-03-01 DIAGNOSIS — R509 Fever, unspecified: Secondary | ICD-10-CM

## 2013-03-01 DIAGNOSIS — R51 Headache: Secondary | ICD-10-CM

## 2013-03-01 DIAGNOSIS — J069 Acute upper respiratory infection, unspecified: Secondary | ICD-10-CM

## 2013-03-28 ENCOUNTER — Other Ambulatory Visit: Payer: Self-pay

## 2013-04-03 ENCOUNTER — Emergency Department (HOSPITAL_COMMUNITY)
Admission: EM | Admit: 2013-04-03 | Discharge: 2013-04-03 | Disposition: A | Discharge status: Home / Self Care | Payer: BC Managed Care – PPO | Attending: Emergency Medicine | Admitting: Emergency Medicine

## 2013-04-03 ENCOUNTER — Encounter (HOSPITAL_COMMUNITY): Payer: Self-pay | Admitting: Emergency Medicine

## 2013-04-03 ENCOUNTER — Telehealth: Payer: Self-pay | Admitting: Internal Medicine

## 2013-04-03 DIAGNOSIS — Z8669 Personal history of other diseases of the nervous system and sense organs: Secondary | ICD-10-CM | POA: Insufficient documentation

## 2013-04-03 DIAGNOSIS — Z79899 Other long term (current) drug therapy: Secondary | ICD-10-CM | POA: Insufficient documentation

## 2013-04-03 DIAGNOSIS — Z882 Allergy status to sulfonamides status: Secondary | ICD-10-CM | POA: Insufficient documentation

## 2013-04-03 DIAGNOSIS — Z888 Allergy status to other drugs, medicaments and biological substances status: Secondary | ICD-10-CM | POA: Insufficient documentation

## 2013-04-03 DIAGNOSIS — M549 Dorsalgia, unspecified: Secondary | ICD-10-CM | POA: Insufficient documentation

## 2013-04-03 DIAGNOSIS — R110 Nausea: Secondary | ICD-10-CM | POA: Insufficient documentation

## 2013-04-03 DIAGNOSIS — K219 Gastro-esophageal reflux disease without esophagitis: Secondary | ICD-10-CM | POA: Insufficient documentation

## 2013-04-03 DIAGNOSIS — Z8719 Personal history of other diseases of the digestive system: Secondary | ICD-10-CM | POA: Insufficient documentation

## 2013-04-03 DIAGNOSIS — R109 Unspecified abdominal pain: Secondary | ICD-10-CM | POA: Insufficient documentation

## 2013-04-03 DIAGNOSIS — Z880 Allergy status to penicillin: Secondary | ICD-10-CM | POA: Insufficient documentation

## 2013-04-03 DIAGNOSIS — Z87440 Personal history of urinary (tract) infections: Secondary | ICD-10-CM | POA: Insufficient documentation

## 2013-04-03 LAB — URINE MICROSCOPIC-ADD ON

## 2013-04-03 LAB — CBC WITH DIFFERENTIAL/PLATELET
Basophils Absolute: 0 10*3/uL (ref 0.0–0.1)
Basophils Relative: 0 % (ref 0–1)
Eosinophils Absolute: 0 10*3/uL (ref 0.0–0.7)
Eosinophils Relative: 1 % (ref 0–5)
HCT: 38.4 % (ref 36.0–46.0)
Hemoglobin: 13.9 g/dL (ref 12.0–15.0)
Lymphocytes Relative: 15 % (ref 12–46)
Lymphs Abs: 1 10*3/uL (ref 0.7–4.0)
MCH: 32.5 pg (ref 26.0–34.0)
MCHC: 36.2 g/dL — ABNORMAL HIGH (ref 30.0–36.0)
MCV: 89.7 fL (ref 78.0–100.0)
Monocytes Absolute: 0.5 10*3/uL (ref 0.1–1.0)
Monocytes Relative: 8 % (ref 3–12)
Neutro Abs: 4.9 10*3/uL (ref 1.7–7.7)
Neutrophils Relative %: 76 % (ref 43–77)
Platelets: 202 10*3/uL (ref 150–400)
RBC: 4.28 MIL/uL (ref 3.87–5.11)
RDW: 12.7 % (ref 11.5–15.5)
WBC: 6.5 10*3/uL (ref 4.0–10.5)

## 2013-04-03 LAB — COMPREHENSIVE METABOLIC PANEL
ALT: 46 U/L — ABNORMAL HIGH (ref 0–35)
AST: 38 U/L — ABNORMAL HIGH (ref 0–37)
Albumin: 3.8 g/dL (ref 3.5–5.2)
Alkaline Phosphatase: 51 U/L (ref 39–117)
BUN: 12 mg/dL (ref 6–23)
CO2: 22 mEq/L (ref 19–32)
Calcium: 8.4 mg/dL (ref 8.4–10.5)
Chloride: 101 mEq/L (ref 96–112)
Creatinine, Ser: 0.78 mg/dL (ref 0.50–1.10)
GFR calc Af Amer: >90 mL/min (ref >90)
GFR calc non Af Amer: >90 mL/min (ref >90)
Glucose, Bld: 92 mg/dL (ref 70–99)
Potassium: 3.4 mEq/L — ABNORMAL LOW (ref 3.5–5.1)
Sodium: 135 mEq/L (ref 135–145)
Total Bilirubin: 0.3 mg/dL (ref 0.3–1.2)
Total Protein: 7.4 g/dL (ref 6.0–8.3)

## 2013-04-03 LAB — URINALYSIS, ROUTINE W REFLEX MICROSCOPIC
Glucose, UA: NEGATIVE mg/dL
Ketones, ur: 15 mg/dL — AB
Leukocytes, UA: NEGATIVE
Nitrite: NEGATIVE
Protein, ur: NEGATIVE mg/dL
Specific Gravity, Urine: 1.033 — ABNORMAL HIGH (ref 1.005–1.030)
Urobilinogen, UA: 1 mg/dL (ref 0.0–1.0)
pH: 5.5 (ref 5.0–8.0)

## 2013-04-03 LAB — LIPASE, BLOOD: Lipase: 30 U/L (ref 11–59)

## 2013-04-03 MED ORDER — ONDANSETRON HCL 4 MG/2ML IJ SOLN
4.0000 mg | Freq: Once | INTRAMUSCULAR | Status: AC
  Administered 2013-04-03: 4 mg via INTRAVENOUS
  Filled 2013-04-03: qty 2

## 2013-04-03 MED ORDER — SODIUM CHLORIDE 0.9 % IV BOLUS (SEPSIS)
1000.0000 mL | Freq: Once | INTRAVENOUS | Status: AC
  Administered 2013-04-03: 1000 mL via INTRAVENOUS

## 2013-04-03 MED ORDER — DICYCLOMINE HCL 20 MG PO TABS: 20.0000 mg | ORAL_TABLET | Freq: Two times a day (BID) | ORAL | Status: DC

## 2013-04-03 MED ORDER — MORPHINE SULFATE 4 MG/ML IJ SOLN
4.0000 mg | Freq: Once | INTRAMUSCULAR | Status: AC
  Administered 2013-04-03: 4 mg via INTRAVENOUS
  Filled 2013-04-03: qty 1

## 2013-04-03 MED ORDER — DICYCLOMINE HCL 10 MG PO CAPS
20.0000 mg | ORAL_CAPSULE | Freq: Once | ORAL | Status: AC
  Administered 2013-04-03: 20 mg via ORAL
  Filled 2013-04-03 (×2): qty 2

## 2013-04-03 MED ORDER — CIPROFLOXACIN HCL 500 MG PO TABS: 500.0000 mg | ORAL_TABLET | Freq: Two times a day (BID) | ORAL | Status: DC

## 2013-04-03 MED ORDER — ONDANSETRON 4 MG PO TBDP: 4.0000 mg | ORAL_TABLET | Freq: Three times a day (TID) | ORAL | Status: DC | PRN

## 2013-04-03 NOTE — Telephone Encounter (Signed)
Noted  

## 2013-04-03 NOTE — ED Notes (Signed)
Lower abd pain and back pain for the past couple of days nausea no vomiting/diarrhea/ or dysuria had a bad taste in mouth states has a 55 month old and she vomited  recently

## 2013-04-03 NOTE — Telephone Encounter (Signed)
Call into to triage line for rib pain, back pain , sweating and nausea.  Attempted to call patient back at [phone]. (Patient work at [employer]). Coworker states patient has left for the day sick.  She was going to the Emergency Room.  Left message with coworker. No contact . No triage.

## 2013-04-03 NOTE — ED Notes (Signed)
Pt states that she has a hx of pancreatitis. Pt says the pain kind of reminds her of pancreatitis pain. Pt states the back pain feels like UTI pain.

## 2013-04-03 NOTE — ED Provider Notes (Signed)
CSN: [id number]     Arrival date & time 04/03/13  1328 History   First MD Initiated Contact with Patient 04/03/13 1542     Chief Complaint  Patient presents with  . Abdominal Pain   (Consider location/radiation/quality/duration/timing/severity/associated sxs/prior Treatment) Patient is a 47 y.o. female presenting with abdominal pain. The history is provided by the patient and medical records.  Abdominal Pain Associated symptoms: nausea    This is a 47 year old female with past history significant for GERD, pancreatitis, esophagitis, presenting to the ED for lower abdominal pain and back pain for the past few days.  Pain described as a cramping/burning sensation with intermittent sharp, shooting pain. Patient has been nauseated but denies any vomiting or diarrhea. No urinary symptoms or vaginal complaints but states she does tend to get UTI's quite frequently. Denies any fever, sweats, or chills. BM normal, no hematochezia.  Patient does have an adopted 70-month-old who has been sick with a GI bug since Monday.  Pt has had a prior colonoscopy in 2010 by Dr. Juanda Chance which was normal, specifically no diverticulosis.  No meds taken PTA.  Past Medical History  Diagnosis Date  . EXTERNAL HEMORRHOIDS 02/18/2010  . VARICOSE VEIN 01/06/2010  . GERD 03/13/2007  . HIP PAIN, RIGHT 02/23/2009  . LOW BACK PAIN, MILD 08/06/2008  . FATIGUE 04/13/2007  . PARESTHESIA 03/13/2007  . Headache(784.0) 03/13/2007  . Abdominal pain, epigastric 01/06/2010  . LIVER FUNCTION TESTS, ABNORMAL 01/06/2010  . ESOPHAGITIS, HX OF 02/18/2010  . Pulmonary nodule     incidental on ct 2007  neg cxray 2011  . Pancreatitis 12/18/09  . Chronic headaches   . Esophagitis   . Gallstones    Past Surgical History  Procedure Laterality Date  . Cholecystectomy    . Tonsillectomy    . Tonsillectomy     Family History  Problem Relation Age of Onset  . Alcohol abuse Father   . Cardiomyopathy Father   . Anemia Father   . Heart  disease Father   . Other Father     Irrg. Heart Beat  . Lung cancer Mother   . Diabetes Mother   . Other Brother     Irr. Heart Beat  . Pancreatitis Maternal Grandfather   . Colon cancer Maternal Grandmother    History  Substance Use Topics  . Smoking status: Never Smoker   . Smokeless tobacco: Never Used  . Alcohol Use: No     Comment: 2 per month   OB History   Grav Para Term Preterm Abortions TAB SAB Ect Mult Living                 Review of Systems  Gastrointestinal: Positive for nausea and abdominal pain.  All other systems reviewed and are negative.    Allergies  Gadolinium; Penicillins; and Sulfonamide derivatives  Home Medications   Current Outpatient Rx  Name  Route  Sig  Dispense  Refill  . dexlansoprazole (DEXILANT) 60 MG capsule   Oral   Take 1 capsule (60 mg total) by mouth daily.   30 capsule   5   . HYDROcodone-acetaminophen (NORCO/VICODIN) 5-325 MG per tablet      take 1 tablet by mouth every 4 hours if needed for pain   15 tablet   0   . HYDROcodone-homatropine (HYCODAN) 5-1.5 MG/5ML syrup   Oral   Take 5 mLs by mouth every 8 (eight) hours as needed for cough.   120 mL   0   .  ibuprofen (ADVIL,MOTRIN) 200 MG tablet   Oral   Take 400 mg by mouth every 6 (six) hours as needed for moderate pain.          . SUMAtriptan (IMITREX) 100 MG tablet   Oral   Take 100 mg by mouth every 2 (two) hours as needed for migraine. May repeat in 2 hours if headache persists or recurs.          BP 122/84  Pulse 72  Temp(Src) 98.6 F (37 C) (Oral)  Resp 16  Wt 171 lb 6.4 oz (77.747 kg)  SpO2 100%  Physical Exam  Nursing note and vitals reviewed. Constitutional: She is oriented to person, place, and time. She appears well-developed and well-nourished. No distress.  Lying in bed comfortably, NAD  HENT:  Head: Normocephalic and atraumatic.  Mouth/Throat: Oropharynx is clear and moist.  Eyes: Conjunctivae and EOM are normal. Pupils are equal,  round, and reactive to light.  Neck: Normal range of motion.  Cardiovascular: Normal rate, regular rhythm and normal heart sounds.   Pulmonary/Chest: Effort normal and breath sounds normal. No respiratory distress. She has no wheezes.  Abdominal: Soft. Bowel sounds are normal. There is tenderness in the suprapubic area and left lower quadrant. There is no CVA tenderness.  Abdomen soft, non-tender, mild suprapubic and LLQ pain  Musculoskeletal: Normal range of motion.  Neurological: She is alert and oriented to person, place, and time.  Skin: Skin is warm and dry. She is not diaphoretic.  Psychiatric: She has a normal mood and affect.    ED Course  Procedures (including critical care time) Labs Review Labs Reviewed  CBC WITH DIFFERENTIAL - Abnormal; Notable for the following:    MCHC 36.2 (*)    All other components within normal limits  URINALYSIS, ROUTINE W REFLEX MICROSCOPIC - Abnormal; Notable for the following:    APPearance CLOUDY (*)    Specific Gravity, Urine 1.033 (*)    Hgb urine dipstick MODERATE (*)    Bilirubin Urine SMALL (*)    Ketones, ur 15 (*)    All other components within normal limits  COMPREHENSIVE METABOLIC PANEL - Abnormal; Notable for the following:    Potassium 3.4 (*)    AST 38 (*)    ALT 46 (*)    All other components within normal limits  URINE MICROSCOPIC-ADD ON - Abnormal; Notable for the following:    Squamous Epithelial / LPF MANY (*)    Bacteria, UA MANY (*)    All other components within normal limits  LIPASE, BLOOD   Imaging Review No results found.  EKG Interpretation   None       MDM   1. Abdominal  pain, other specified site   2. Abdominal cramping    On initial evaluation pt is afebrile, comfortably lying in bed, NAD.  Exam revealing mild suprapubic/LLQ abdominal pain.  Prior colonoscopy without noted diverticula, at this time i have low suspicion for diverticulitis.  Will give fluids, meds, and reassess.  VS stable.  6:34  PM Labs as above, no leukocytosis.  U/a with infection vs contamination (many bacteria, many squamous).  Re-assessed pt-- States she is feeling better.  Some cramping sensations still present but she states "it seems to be jumping around my abdomen." Will give dose of Bentyl, allow fluids to finish, and initiate PO challenge.  Likely d/c soon.  7:12 PM Fluid finished, pt given bentyl-- states she feels much better, abdominal pain resolved.  Has tolerated PO without difficulty.  Pt has remained afebrile, non-toxic appearing, NAD, with only mild abdominal findings on physical exam.  At this time, i doubt acute/surgical abdomen including, but not limited to, diverticulitis, SBO.  Rx bentyl, zofran.  Will give short course cipro to cover for possible UTI, culture pending.  FU with PCP if problems occur.  Discussed plan with pt, she agreed.  Strict return precautions advised should sx change or worsen.   Vs stable at time of discharge.   Garlon Hatchet, PA-C 04/03/13 2030

## 2013-04-04 NOTE — ED Provider Notes (Signed)
Medical screening examination/treatment/procedure(s) were performed by non-physician practitioner and as supervising physician I was immediately available for consultation/collaboration.  EKG Interpretation   None         Junius Argyle, MD 04/04/13 [phone]

## 2013-05-10 ENCOUNTER — Other Ambulatory Visit: Payer: Self-pay | Admitting: Family Medicine

## 2013-05-10 MED ORDER — POLYMYXIN B-TRIMETHOPRIM 10000-0.1 UNIT/ML-% OP SOLN: 1.0000 [drp] | Freq: Four times a day (QID) | OPHTHALMIC | Status: DC

## 2013-05-10 NOTE — Telephone Encounter (Signed)
Patient called to complain of yellow eye discharge that started on Wednesday.  Eyes are red and irritated.  Per PC, sent in polytrim 1 drop each eye q 6. Patient must make an appointment if not better.

## 2013-06-17 ENCOUNTER — Encounter: Payer: Self-pay | Admitting: Internal Medicine

## 2013-06-24 DIAGNOSIS — Z0279 Encounter for issue of other medical certificate: Secondary | ICD-10-CM

## 2013-06-25 NOTE — Telephone Encounter (Signed)
Misty form is done . On your desk.

## 2013-09-29 ENCOUNTER — Other Ambulatory Visit: Payer: Self-pay | Admitting: Internal Medicine

## 2013-09-30 NOTE — Telephone Encounter (Signed)
Ok x 2  

## 2013-12-12 ENCOUNTER — Other Ambulatory Visit: Payer: Self-pay | Admitting: Internal Medicine

## 2014-01-02 ENCOUNTER — Encounter: Payer: Self-pay | Admitting: Physician Assistant

## 2014-01-02 ENCOUNTER — Ambulatory Visit (INDEPENDENT_AMBULATORY_CARE_PROVIDER_SITE_OTHER): Payer: BC Managed Care – PPO | Admitting: Physician Assistant

## 2014-01-02 VITALS — BP 100/68 | HR 72 | Temp 98.4°F | Resp 18 | Wt 173.0 lb

## 2014-01-02 DIAGNOSIS — R350 Frequency of micturition: Secondary | ICD-10-CM

## 2014-01-02 DIAGNOSIS — R252 Cramp and spasm: Secondary | ICD-10-CM

## 2014-01-02 DIAGNOSIS — I8390 Asymptomatic varicose veins of unspecified lower extremity: Secondary | ICD-10-CM

## 2014-01-02 LAB — POCT URINALYSIS DIPSTICK
Bilirubin, UA: NEGATIVE
Glucose, UA: NEGATIVE
Ketones, UA: NEGATIVE
Nitrite, UA: NEGATIVE
Protein, UA: NEGATIVE
Spec Grav, UA: 1.025
Urobilinogen, UA: 0.2
pH, UA: 5.5

## 2014-01-02 LAB — BASIC METABOLIC PANEL
BUN: 17 mg/dL (ref 6–23)
CO2: 26 mEq/L (ref 19–32)
Calcium: 8.8 mg/dL (ref 8.4–10.5)
Chloride: 106 mEq/L (ref 96–112)
Creatinine, Ser: 0.7 mg/dL (ref 0.4–1.2)
GFR: 90.78 mL/min (ref >60.00)
Glucose, Bld: 86 mg/dL (ref 70–99)
Potassium: 3.8 mEq/L (ref 3.5–5.1)
Sodium: 137 mEq/L (ref 135–145)

## 2014-01-02 LAB — B12 AND FOLATE PANEL
Folate: 16.7 ng/mL (ref >5.9)
Vitamin B-12: 225 pg/mL (ref 211–911)

## 2014-01-02 LAB — MAGNESIUM: Magnesium: 1.7 mg/dL (ref 1.5–2.5)

## 2014-01-02 MED ORDER — CIPROFLOXACIN HCL 250 MG PO TABS: 250.0000 mg | ORAL_TABLET | Freq: Two times a day (BID) | ORAL | Status: DC

## 2014-01-02 NOTE — Patient Instructions (Addendum)
Cipro twice daily for 7 days for UTI.  We will call with the results of your labwork when available.  Push fluid hydration with water.  If emergency symptoms discussed during visit developed, seek medical attention immediately.  Followup as needed, or for worsening or persistent symptoms despite treatment.    Urinary Tract Infection A urinary tract infection (UTI) can occur any place along the urinary tract. The tract includes the kidneys, ureters, bladder, and urethra. A type of germ called bacteria often causes a UTI. UTIs are often helped with antibiotic medicine.  HOME CARE   If given, take antibiotics as told by your doctor. Finish them even if you start to feel better.  Drink enough fluids to keep your pee (urine) clear or pale yellow.  Avoid tea, drinks with caffeine, and bubbly (carbonated) drinks.  Pee often. Avoid holding your pee in for a long time.  Pee before and after having sex (intercourse).  Wipe from front to back after you poop (bowel movement) if you are a woman. Use each tissue only once. GET HELP RIGHT AWAY IF:   You have back pain.  You have lower belly (abdominal) pain.  You have chills.  You feel sick to your stomach (nauseous).  You throw up (vomit).  Your burning or discomfort with peeing does not go away.  You have a fever.  Your symptoms are not better in 3 days. MAKE SURE YOU:   Understand these instructions.  Will watch your condition.  Will get help right away if you are not doing well or get worse. Document Released: 10/26/2007 Document Revised: 02/01/2012 Document Reviewed: 12/08/2011 [facility] Patient Information 2015 [place], [place]. This information is not intended to replace advice given to you by your health care provider. Make sure you discuss any questions you have with your health care provider. Varicose Veins Varicose veins are veins that have become enlarged and twisted. CAUSES This condition is the result of valves in  the veins not working properly. Valves in the veins help return blood from the leg to the heart. If these valves are damaged, blood flows backwards and backs up into the veins in the leg near the skin. This causes the veins to become larger. People who are on their feet a lot, who are pregnant, or who are overweight are more likely to develop varicose veins. SYMPTOMS   Bulging, twisted-appearing, bluish veins, most commonly found on the legs.  Leg pain or a feeling of heaviness. These symptoms may be worse at the end of the day.  Leg swelling.  Skin color changes. DIAGNOSIS  Varicose veins can usually be diagnosed with an exam of your legs by your caregiver. He or she may recommend an ultrasound of your leg veins. TREATMENT  Most varicose veins can be treated at home.However, other treatments are available for people who have persistent symptoms or who want to treat the cosmetic appearance of the varicose veins. These include:  Laser treatment of very small varicose veins.  Medicine that is shot (injected) into the vein. This medicine hardens the walls of the vein and closes off the vein. This treatment is called sclerotherapy. Afterwards, you may need to wear clothing or bandages that apply pressure.  Surgery. HOME CARE INSTRUCTIONS   Do not stand or sit in one position for long periods of time. Do not sit with your legs crossed. Rest with your legs raised during the day.  Wear elastic stockings or support hose. Do not wear other tight, encircling garments  around the legs, pelvis, or waist.  Walk as much as possible to increase blood flow.  Raise the foot of your bed at night with 2-inch blocks.  If you get a cut in the skin over the vein and the vein bleeds, lie down with your leg raised and press on it with a clean cloth until the bleeding stops. Then place a bandage (dressing) on the cut. See your caregiver if it continues to bleed or needs stitches. SEEK MEDICAL CARE IF:   The  skin around your ankle starts to break down.  You have pain, redness, tenderness, or hard swelling developing in your leg over a vein.  You are uncomfortable due to leg pain. Document Released: 02/16/2005 Document Revised: 08/01/2011 Document Reviewed: 07/05/2010 [facility] Patient Information 2015 [place], [place]. This information is not intended to replace advice given to you by your health care provider. Make sure you discuss any questions you have with your health care provider. Leg Cramps Leg cramps that occur during exercise can be caused by poor circulation or dehydration. However, muscle cramps that occur at rest or during the night are usually not due to any serious medical problem. Heat cramps may cause muscle spasms during hot weather.  CAUSES There is no clear cause for muscle cramps. However, dehydration may be a factor for those who do not drink enough fluids and those who exercise in the heat. Imbalances in the level of sodium, potassium, calcium or magnesium in the muscle tissue may also be a factor. Some medications, such as water pills (diuretics), may cause loss of chemicals that the body needs (like sodium and potassium) and cause muscle cramps. TREATMENT   Make sure your diet has enough fluids and essential minerals for the muscle to work normally.  Avoid strenuous exercise for several days if you have been having frequent leg cramps.  Stretch and massage the cramped muscle for several minutes.  Some medicines may be helpful in some patients with night cramps. Only take over-the-counter or prescription medicines as directed by your caregiver. SEEK IMMEDIATE MEDICAL CARE IF:   Your leg cramps become worse.  Your foot becomes cold, numb, or blue. Document Released: 06/16/2004 Document Revised: 08/01/2011 Document Reviewed: 06/03/2008 [facility] Patient Information 2015 [place], [place]. This information is not intended to replace advice given to you by your health care  provider. Make sure you discuss any questions you have with your health care provider.

## 2014-01-02 NOTE — Progress Notes (Signed)
Pre visit review using our clinic review tool, if applicable. No additional management support is needed unless otherwise documented below in the visit note. 

## 2014-01-02 NOTE — Progress Notes (Signed)
Subjective:    Patient ID: Debra [name], female    DOB: 1965-07-11, 48 y.o.   MRN: [id number]  Urinary Frequency  This is a new problem. The current episode started 1 to 4 weeks ago. The problem occurs every urination. Quality: pressure feeling. The pain is at a severity of 0/10. The patient is experiencing no pain. There has been no fever. There is no history of pyelonephritis. Associated symptoms include frequency and urgency. Pertinent negatives include no chills, discharge, flank pain, hematuria, hesitancy, nausea, possible pregnancy, sweats or vomiting. Treatments tried: tried some old Macrodantin, took for BID for 2 days. The treatment provided no relief. There is no history of catheterization, kidney stones, recurrent UTIs, a single kidney, urinary stasis or a urological procedure.   Past Medical History  Diagnosis Date  . EXTERNAL HEMORRHOIDS 02/18/2010  . VARICOSE VEIN 01/06/2010  . GERD 03/13/2007  . HIP PAIN, RIGHT 02/23/2009  . LOW BACK PAIN, MILD 08/06/2008  . FATIGUE 04/13/2007  . PARESTHESIA 03/13/2007  . Headache(784.0) 03/13/2007  . Abdominal pain, epigastric 01/06/2010  . LIVER FUNCTION TESTS, ABNORMAL 01/06/2010  . ESOPHAGITIS, HX OF 02/18/2010  . Pulmonary nodule     incidental on ct 2007  neg cxray 2011  . Pancreatitis 12/18/09  . Chronic headaches   . Esophagitis   . Gallstones     History   Social History  . Marital Status: Married    Spouse Name: N/A    Number of Children: 0  . Years of Education: N/A   Occupational History  . PHARMACIST    Social History Main Topics  . Smoking status: Never Smoker   . Smokeless tobacco: Never Used  . Alcohol Use: No     Comment: 2 per month  . Drug Use: No  . Sexual Activity: Not on file   Other Topics Concern  . Not on file   Social History Narrative   Dogs 2     married is a Software engineer never smoked.    hx fertility treatment    hh of 2   6- 8 hours  Sleep    3 diet coke per day   2 etoh per month                       Past Surgical History  Procedure Laterality Date  . Cholecystectomy    . Tonsillectomy    . Tonsillectomy      Family History  Problem Relation Age of Onset  . Alcohol abuse Father   . Cardiomyopathy Father   . Anemia Father   . Heart disease Father   . Other Father     Irrg. Heart Beat  . Lung cancer Mother   . Diabetes Mother   . Other Brother     Irr. Heart Beat  . Pancreatitis Maternal Grandfather   . Colon cancer Maternal Grandmother     Allergies  Allergen Reactions  . Gadolinium      Code: HIVES, Desc: Multihance--pt had hives, Onset Date: [id number] IVP Dye  . Penicillins     REACTION: rash  . Sulfonamide Derivatives     REACTION: rash    Current Outpatient Prescriptions on File Prior to Visit  Medication Sig Dispense Refill  . promethazine (PHENERGAN) 25 MG tablet take 1 tablet by mouth every 4 to 6 hours if needed for nausea and vomiting  20 tablet  2  . SUMAtriptan (IMITREX) 100 MG tablet Take 100 mg by mouth  every 2 (two) hours as needed for migraine. May repeat in 2 hours if headache persists or recurs.      Marland Kitchen dexlansoprazole (DEXILANT) 60 MG capsule Take 1 capsule (60 mg total) by mouth daily.  30 capsule  5  . dicyclomine (BENTYL) 20 MG tablet Take 1 tablet (20 mg total) by mouth 2 (two) times daily.  20 tablet  0  . ibuprofen (ADVIL,MOTRIN) 200 MG tablet Take 400 mg by mouth every 6 (six) hours as needed for moderate pain.       . SUMAtriptan (IMITREX) 100 MG tablet TAKE 1 TABLET BY MOUTH IF NEEDED FOR HEADACHE MAY REPEAT IN 2 HOURS IF NEEDED  9 tablet  1   No current facility-administered medications on file prior to visit.    EXAM: BP 100/68  Pulse 72  Temp(Src) 98.4 F (36.9 C) (Oral)  Resp 18  Wt 173 lb (78.472 kg)  Pt is also complaining of leg cramps that seem a little worse over the last week. She states that she has occasionally tried a multivitamin and eats bananas as well, which don't seem to help. She reports  she mostly drinks water and diet cokes, and not much.  The cramps occur in both legs, in the calves, and rarely in toes/feet. She gets the cramps while laying/relaxing, or when she is stretching.   Review of Systems  Constitutional: Negative for fever and chills.  Respiratory: Negative for shortness of breath.   Cardiovascular: Negative for chest pain.  Gastrointestinal: Negative for nausea, vomiting, abdominal pain and diarrhea.  Genitourinary: Positive for urgency and frequency. Negative for hesitancy, hematuria and flank pain.  Neurological: Negative for syncope and headaches.  All other systems reviewed and are negative.      Objective:   Physical Exam  Nursing note and vitals reviewed. Constitutional: She is oriented to person, place, and time. She appears well-developed and well-nourished. No distress.  HENT:  Head: Normocephalic and atraumatic.  Cardiovascular: Normal rate, regular rhythm and intact distal pulses.   Pulmonary/Chest: Effort normal. No respiratory distress.  Musculoskeletal: Normal range of motion. She exhibits no edema and no tenderness.  Neurological: She is alert and oriented to person, place, and time.  Skin: Skin is warm and dry. No rash noted. She is not diaphoretic. No erythema. No pallor.  Varicose vein involving the medial left lower extremity. Soft, no erythema, no warmth, non-ttp.  Psychiatric: She has a normal mood and affect. Her behavior is normal. Judgment and thought content normal.     Lab Results  Component Value Date   WBC 6.5 04/03/2013   HGB 13.9 04/03/2013   HCT 38.4 04/03/2013   PLT 202 04/03/2013   GLUCOSE 92 04/03/2013   CHOL 200 03/28/2012   TRIG 61.0 03/28/2012   HDL 52.60 03/28/2012   LDLCALC 135* 03/28/2012   ALT 46* 04/03/2013   AST 38* 04/03/2013   NA 135 04/03/2013   K 3.4* 04/03/2013   CL 101 04/03/2013   CREATININE 0.78 04/03/2013   BUN 12 04/03/2013   CO2 22 04/03/2013   TSH 1.09 03/28/2012        Assessment &  Plan:  Debra [name] was seen today for urinary frequency and leg cramps.  Diagnoses and associated orders for this visit:  Urinary frequency Comments: UA showed blood and leuks, culture. Tx with Cipro due to recent microdantin misuse. - POCT urinalysis dipstick; Standing - POCT urinalysis dipstick - Culture, Urine - ciprofloxacin (CIPRO) 250 MG tablet; Take 1 tablet (250 mg  total) by mouth 2 (two) times daily.  Cramps of lower extremity, unspecified laterality Comments: Like hydration related. check bmp and mg, push fluids. Watchful waiting. - Basic Metabolic Panel - Magnesium - B12 and Folate Panel  Varicose veins Comments: Uncomplicated, encourage leg raises daily, regular exercise, and compression stockings. Watchful waiting.    Return precautions provided, and patient handout on leg cramps, UTI, varicose veins.  Plan to follow up as needed, or for worsening or persistent symptoms despite treatment.  Patient Instructions  Cipro twice daily for 7 days for UTI.  We will call with the results of your labwork when available.  Push fluid hydration with water.  If emergency symptoms discussed during visit developed, seek medical attention immediately.  Followup as needed, or for worsening or persistent symptoms despite treatment.

## 2014-01-04 LAB — URINE CULTURE
Colony Count: NO GROWTH
Organism ID, Bacteria: NO GROWTH

## 2014-02-20 ENCOUNTER — Encounter: Payer: Self-pay | Admitting: Cardiology

## 2014-02-26 ENCOUNTER — Telehealth: Payer: Self-pay | Admitting: Internal Medicine

## 2014-02-26 NOTE — Telephone Encounter (Signed)
Ok can use the [address]

## 2014-02-26 NOTE — Telephone Encounter (Signed)
Ok to schedule.

## 2014-02-26 NOTE — Telephone Encounter (Signed)
Pt scheduled  

## 2014-02-26 NOTE — Telephone Encounter (Signed)
Patient Information:  Caller Name: [place]  Phone: [phone]  Patient: Debra [name], Debra [name]  Gender: Female  DOB: 1965/12/01  Age: 48 Years  PCP: Shanon Ace [facility])  Pregnant: No  Office Follow Up:  Does the office need to follow up with this patient?: Yes  Instructions For The Office: See RN Note.  RN Note:  Patient works until 9pm today, 10/7, and desires an appointment for tomorrow, 10/8. Triage nurse unable to schedule an appointment within 24 hours per protocol with her PCP as requested. PLEASE CALL PATIENT BACK AT [phone] TO SCHEDULE AN APPOINTMENT. THANK YOU.  Symptoms  Reason For Call & Symptoms: Abdominal Pain, Nausea, Bloating  Reviewed Health History In EMR: Yes  Reviewed Medications In EMR: Yes  Reviewed Allergies In EMR: Yes  Reviewed Surgeries / Procedures: Yes  Date of Onset of Symptoms: 02/24/2014  Treatments Tried: Phenergan  Treatments Tried Worked: No OB / GYN:  LMP: 02/12/2014  Guideline(s) Used:  Abdominal Pain - Female  Disposition Per Guideline:   See Today in Office  Reason For Disposition Reached:   Moderate or mild pain that comes and goes (cramps) lasts > 24 hours  Advice Given:  N/A  Patient Will Follow Care Advice:  YES

## 2014-02-27 ENCOUNTER — Ambulatory Visit (INDEPENDENT_AMBULATORY_CARE_PROVIDER_SITE_OTHER): Payer: BC Managed Care – PPO | Admitting: Internal Medicine

## 2014-02-27 ENCOUNTER — Encounter: Payer: Self-pay | Admitting: Internal Medicine

## 2014-02-27 ENCOUNTER — Ambulatory Visit (INDEPENDENT_AMBULATORY_CARE_PROVIDER_SITE_OTHER)
Admission: RE | Admit: 2014-02-27 | Discharge: 2014-02-27 | Disposition: A | Discharge status: Home / Self Care | Payer: BC Managed Care – PPO | Source: Ambulatory Visit | Attending: Internal Medicine | Admitting: Internal Medicine

## 2014-02-27 ENCOUNTER — Ambulatory Visit: Payer: BC Managed Care – PPO | Admitting: Internal Medicine

## 2014-02-27 VITALS — BP 108/76 | Temp 98.2°F | Ht 67.25 in | Wt 170.0 lb

## 2014-02-27 DIAGNOSIS — R1032 Left lower quadrant pain: Secondary | ICD-10-CM

## 2014-02-27 DIAGNOSIS — R140 Abdominal distension (gaseous): Secondary | ICD-10-CM

## 2014-02-27 DIAGNOSIS — J069 Acute upper respiratory infection, unspecified: Secondary | ICD-10-CM

## 2014-02-27 LAB — POCT URINALYSIS DIPSTICK
Bilirubin, UA: NEGATIVE
Blood, UA: NEGATIVE
Glucose, UA: NEGATIVE
Ketones, UA: NEGATIVE
Leukocytes, UA: NEGATIVE
Nitrite, UA: NEGATIVE
Protein, UA: NEGATIVE
Spec Grav, UA: 1.025
Urobilinogen, UA: 0.2
pH, UA: 5.5

## 2014-02-27 LAB — POCT URINE PREGNANCY: Preg Test, Ur: NEGATIVE

## 2014-02-27 MED ORDER — IOHEXOL 300 MG/ML  SOLN
100.0000 mL | Freq: Once | INTRAMUSCULAR | Status: AC | PRN
  Administered 2014-02-27: 100 mL via INTRAVENOUS

## 2014-02-27 NOTE — Patient Instructions (Addendum)
Ct scan checking for  Diverticulitis  Other . Which is treated with antibiotics as we discussed . Urine looks clear .  Keep you gyne appt.also  Will be contacted about results of scan and plan for fu.   Diverticulitis Diverticulitis is inflammation or infection of small pouches in your colon that form when you have a condition called diverticulosis. The pouches in your colon are called diverticula. Your colon, or large intestine, is where water is absorbed and stool is formed. Complications of diverticulitis can include:  Bleeding.  Severe infection.  Severe pain.  Perforation of your colon.  Obstruction of your colon. CAUSES  Diverticulitis is caused by bacteria. Diverticulitis happens when stool becomes trapped in diverticula. This allows bacteria to grow in the diverticula, which can lead to inflammation and infection. RISK FACTORS People with diverticulosis are at risk for diverticulitis. Eating a diet that does not include enough fiber from fruits and vegetables may make diverticulitis more likely to develop. SYMPTOMS  Symptoms of diverticulitis may include:  Abdominal pain and tenderness. The pain is normally located on the left side of the abdomen, but may occur in other areas.  Fever and chills.  Bloating.  Cramping.  Nausea.  Vomiting.  Constipation.  Diarrhea.  Blood in your stool. DIAGNOSIS  Your health care provider will ask you about your medical history and do a physical exam. You may need to have tests done because many medical conditions can cause the same symptoms as diverticulitis. Tests may include:  Blood tests.  Urine tests.  Imaging tests of the abdomen, including X-rays and CT scans. When your condition is under control, your health care provider may recommend that you have a colonoscopy. A colonoscopy can show how severe your diverticula are and whether something else is causing your symptoms. TREATMENT  Most cases of diverticulitis are  mild and can be treated at home. Treatment may include:  Taking over-the-counter pain medicines.  Following a clear liquid diet.  Taking antibiotic medicines by mouth for 7-10 days. More severe cases may be treated at a hospital. Treatment may include:  Not eating or drinking.  Taking prescription pain medicine.  Receiving antibiotic medicines through an IV tube.  Receiving fluids and nutrition through an IV tube.  Surgery. HOME CARE INSTRUCTIONS   Follow your health care provider's instructions carefully.  Follow a full liquid diet or other diet as directed by your health care provider. After your symptoms improve, your health care provider may tell you to change your diet. He or she may recommend you eat a high-fiber diet. Fruits and vegetables are good sources of fiber. Fiber makes it easier to pass stool.  Take fiber supplements or probiotics as directed by your health care provider.  Only take medicines as directed by your health care provider.  Keep all your follow-up appointments. SEEK MEDICAL CARE IF:   Your pain does not improve.  You have a hard time eating food.  Your bowel movements do not return to normal. SEEK IMMEDIATE MEDICAL CARE IF:   Your pain becomes worse.  Your symptoms do not get better.  Your symptoms suddenly get worse.  You have a fever.  You have repeated vomiting.  You have bloody or black, tarry stools. MAKE SURE YOU:   Understand these instructions.  Will watch your condition.  Will get help right away if you are not doing well or get worse. Document Released: 02/16/2005 Document Revised: 05/14/2013 Document Reviewed: 04/03/2013 [facility] Patient Information 2015 [place], [place]. This information  is not intended to replace advice given to you by your health care provider. Make sure you discuss any questions you have with your health care provider.

## 2014-02-27 NOTE — Progress Notes (Signed)
Pre visit review using our clinic review tool, if applicable. No additional management support is needed unless otherwise documented below in the visit note.  Chief Complaint  Patient presents with  . Abdominal Pain    HPI: Patient Debra [name]  comes in today for SDA for  new problem evaluation. 3+ days of abd pain.  Nausea ;novomiting : no diarrhea. No change in bowel habits  Feel very boated lower  Severity 5/10  No change in bowel habits.  Took ibuprofen.  No vomiting  .  lmp 2.5  Weeks ago. Doesn't think pregnant  Late on her gyne exam has appt  Dr Nori Riis in 2 weeks . No blood no vaginal but feel burning loer abd . Middle and left  Tried ibuprofen.  rx partial r rx uti and given ciopro in august  ucx NG  Samuel [place] has ben there for 2. 5 weeks getting  Better  advil sinus . None this week.    ROS: See pertinent positives and negatives per HPI.no fever chills new rash  No sob syncope new rash chills fever   Past Medical History  Diagnosis Date  . EXTERNAL HEMORRHOIDS 02/18/2010  . VARICOSE VEIN 01/06/2010  . GERD 03/13/2007  . HIP PAIN, RIGHT 02/23/2009  . LOW BACK PAIN, MILD 08/06/2008  . FATIGUE 04/13/2007  . PARESTHESIA 03/13/2007  . Headache(784.0) 03/13/2007  . Abdominal pain, epigastric 01/06/2010  . LIVER FUNCTION TESTS, ABNORMAL 01/06/2010  . ESOPHAGITIS, HX OF 02/18/2010  . Pulmonary nodule     incidental on ct 2007  neg cxray 2011  . Pancreatitis 12/18/09  . Chronic headaches   . Esophagitis   . Gallstones     Family History  Problem Relation Age of Onset  . Alcohol abuse Father   . Cardiomyopathy Father   . Anemia Father   . Heart disease Father   . Other Father     Irrg. Heart Beat  . Lung cancer Mother   . Diabetes Mother   . Other Brother     Irr. Heart Beat  . Pancreatitis Maternal Grandfather   . Colon cancer Maternal Grandmother     History   Social History  . Marital Status: Married    Spouse Name: N/A    Number of Children: 0  . Years of  Education: N/A   Occupational History  . PHARMACIST    Social History Main Topics  . Smoking status: Never Smoker   . Smokeless tobacco: Never Used  . Alcohol Use: No     Comment: 2 per month  . Drug Use: No  . Sexual Activity: None   Other Topics Concern  . 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    hx fertility treatment    hh of 2   6- 8 hours  Sleep    3 diet coke per day   2 etoh per month                       Outpatient Encounter Prescriptions as of 02/27/2014  Medication Sig  . dexlansoprazole (DEXILANT) 60 MG capsule Take 1 capsule (60 mg total) by mouth daily.  Marland Kitchen ibuprofen (ADVIL,MOTRIN) 200 MG tablet Take 400 mg by mouth every 6 (six) hours as needed for moderate pain.   . promethazine (PHENERGAN) 25 MG tablet take 1 tablet by mouth every 4 to 6 hours if needed for nausea and  vomiting  . SUMAtriptan (IMITREX) 100 MG tablet Take 100 mg by mouth every 2 (two) hours as needed for migraine. May repeat in 2 hours if headache persists or recurs.  . SUMAtriptan (IMITREX) 100 MG tablet TAKE 1 TABLET BY MOUTH IF NEEDED FOR HEADACHE MAY REPEAT IN 2 HOURS IF NEEDED  . [DISCONTINUED] ciprofloxacin (CIPRO) 250 MG tablet Take 1 tablet (250 mg total) by mouth 2 (two) times daily.  . [DISCONTINUED] dicyclomine (BENTYL) 20 MG tablet Take 1 tablet (20 mg total) by mouth 2 (two) times daily.    EXAM:  BP 108/76  Temp(Src) 98.2 F (36.8 C) (Oral)  Ht 5' 7.25" (1.708 m)  Wt 170 lb (77.111 kg)  BMI 26.43 kg/m2  Body mass index is 26.43 kg/(m^2).  GENERAL: vitals reviewed and listed above, alert, oriented, appears well hydrated and in no acute distress midly ill non toxic   HEENT: atraumatic, conjunctiva  clear, no obvious abnormalities on inspection of external nose and ears OP : no lesion edema or exudate mild congestion  NECK: no obvious masses on inspection palpation  LUNGS: clear to auscultation bilaterally, no wheezes, rales  or rhonchi, good air movement CV: HRRR, no clubbing cyanosis or  peripheral edema nl cap refill  Abdomen:  Sof,t normal bowel sounds without hepatosplenomegaly, no guarding rebound or masses no CVA tenderness Tender msuprapubic and llq ? Radiates to right  No psioas sign and no peritoneal sx .  MS: moves all extremities without noticeable focal  abnormality PSYCH: pleasant and cooperative, no obvious depression or anxiety Lab Results  Component Value Date   WBC 6.5 04/03/2013   HGB 13.9 04/03/2013   HCT 38.4 04/03/2013   PLT 202 04/03/2013   GLUCOSE 86 01/02/2014   CHOL 200 03/28/2012   TRIG 61.0 03/28/2012   HDL 52.60 03/28/2012   LDLCALC 135* 03/28/2012   ALT 46* 04/03/2013   AST 38* 04/03/2013   NA 137 01/02/2014   K 3.8 01/02/2014   CL 106 01/02/2014   CREATININE 0.7 01/02/2014   BUN 17 01/02/2014   CO2 26 01/02/2014   TSH 1.09 03/28/2012    ASSESSMENT AND PLAN:  Discussed the following assessment and plan:  Abdominal pain, left lower quadrant - Plan: POC Urinalysis Dipstick, POCT urine pregnancy, CT Abdomen Pelvis W Contrast  Abdominal bloating - Plan: CT Abdomen Pelvis W Contrast  URI, acute - resolving uncomplicated Uncertain cause gets significant side effects with the metronidazole can take penicillin if needed for diverticulitis new onset significant for patient change his never had this pain. Recent treatment for UTI with no growth.  Options discussed will get abdominal pelvic CT and go from there. UCG is negative.  A bit atypical for IBS were related. Suppose could be GYN related and plan for that exam is appropriate. -Patient advised to return or notify health care team  if symptoms worsen ,persist or new concerns arise.  Patient Instructions  Ct scan checking for  Diverticulitis  Other . Which is treated with antibiotics as we discussed . Urine looks clear .  Keep you gyne appt.also  Will be contacted about results of scan and plan for  fu.   Diverticulitis Diverticulitis is inflammation or infection of small pouches in your colon that form when you have a condition called diverticulosis. The pouches in your colon are called diverticula. Your colon, or large intestine, is where water is absorbed and stool is formed. Complications of diverticulitis can include:  Bleeding.  Severe infection.  Severe pain.  Perforation of your colon.  Obstruction of your colon. CAUSES  Diverticulitis is caused by bacteria. Diverticulitis happens when stool becomes trapped in diverticula. This allows bacteria to grow in the diverticula, which can lead to inflammation and infection. RISK FACTORS People with diverticulosis are at risk for diverticulitis. Eating a diet that does not include enough fiber from fruits and vegetables may make diverticulitis more likely to develop. SYMPTOMS  Symptoms of diverticulitis may include:  Abdominal pain and tenderness. The pain is normally located on the left side of the abdomen, but may occur in other areas.  Fever and chills.  Bloating.  Cramping.  Nausea.  Vomiting.  Constipation.  Diarrhea.  Blood in your stool. DIAGNOSIS  Your health care provider will ask you about your medical history and do a physical exam. You may need to have tests done because many medical conditions can cause the same symptoms as diverticulitis. Tests may include:  Blood tests.  Urine tests.  Imaging tests of the abdomen, including X-rays and CT scans. When your condition is under control, your health care provider may recommend that you have a colonoscopy. A colonoscopy can show how severe your diverticula are and whether something else is causing your symptoms. TREATMENT  Most cases of diverticulitis are mild and can be treated at home. Treatment may include:  Taking over-the-counter pain medicines.  Following a clear liquid diet.  Taking antibiotic medicines by mouth for 7-10 days. More severe  cases may be treated at a hospital. Treatment may include:  Not eating or drinking.  Taking prescription pain medicine.  Receiving antibiotic medicines through an IV tube.  Receiving fluids and nutrition through an IV tube.  Surgery. HOME CARE INSTRUCTIONS   Follow your health care provider's instructions carefully.  Follow a full liquid diet or other diet as directed by your health care provider. After your symptoms improve, your health care provider may tell you to change your diet. He or she may recommend you eat a high-fiber diet. Fruits and vegetables are good sources of fiber. Fiber makes it easier to pass stool.  Take fiber supplements or probiotics as directed by your health care provider.  Only take medicines as directed by your health care provider.  Keep all your follow-up appointments. SEEK MEDICAL CARE IF:   Your pain does not improve.  You have a hard time eating food.  Your bowel movements do not return to normal. SEEK IMMEDIATE MEDICAL CARE IF:   Your pain becomes worse.  Your symptoms do not get better.  Your symptoms suddenly get worse.  You have a fever.  You have repeated vomiting.  You have bloody or black, tarry stools. MAKE SURE YOU:   Understand these instructions.  Will watch your condition.  Will get help right away if you are not doing well or get worse. Document Released: 02/16/2005 Document Revised: 05/14/2013 Document Reviewed: 04/03/2013 [facility] Patient Information 2015 [place], [place]. This information is not intended to replace advice given to you by your health care provider. Make sure you discuss any questions you have with your health care provider.        Standley Brooking. Cass Vandermeulen M.D.

## 2014-02-28 ENCOUNTER — Encounter (HOSPITAL_COMMUNITY): Payer: Self-pay | Admitting: Emergency Medicine

## 2014-02-28 ENCOUNTER — Emergency Department (HOSPITAL_COMMUNITY)
Admission: EM | Admit: 2014-02-28 | Discharge: 2014-02-28 | Disposition: A | Discharge status: Home / Self Care | Payer: BC Managed Care – PPO | Attending: Emergency Medicine | Admitting: Emergency Medicine

## 2014-02-28 ENCOUNTER — Encounter: Payer: Self-pay | Admitting: Internal Medicine

## 2014-02-28 ENCOUNTER — Emergency Department (HOSPITAL_COMMUNITY): Payer: BC Managed Care – PPO

## 2014-02-28 DIAGNOSIS — R12 Heartburn: Secondary | ICD-10-CM | POA: Insufficient documentation

## 2014-02-28 DIAGNOSIS — Z9049 Acquired absence of other specified parts of digestive tract: Secondary | ICD-10-CM | POA: Insufficient documentation

## 2014-02-28 DIAGNOSIS — G8929 Other chronic pain: Secondary | ICD-10-CM | POA: Diagnosis not present

## 2014-02-28 DIAGNOSIS — D259 Leiomyoma of uterus, unspecified: Secondary | ICD-10-CM | POA: Diagnosis not present

## 2014-02-28 DIAGNOSIS — R1032 Left lower quadrant pain: Secondary | ICD-10-CM

## 2014-02-28 DIAGNOSIS — K219 Gastro-esophageal reflux disease without esophagitis: Secondary | ICD-10-CM | POA: Insufficient documentation

## 2014-02-28 DIAGNOSIS — Z8679 Personal history of other diseases of the circulatory system: Secondary | ICD-10-CM | POA: Insufficient documentation

## 2014-02-28 DIAGNOSIS — R109 Unspecified abdominal pain: Secondary | ICD-10-CM | POA: Diagnosis present

## 2014-02-28 LAB — COMPREHENSIVE METABOLIC PANEL
ALT: 41 U/L — ABNORMAL HIGH (ref 0–35)
AST: 67 U/L — ABNORMAL HIGH (ref 0–37)
Albumin: 3.6 g/dL (ref 3.5–5.2)
Alkaline Phosphatase: 56 U/L (ref 39–117)
Anion gap: 13 (ref 5–15)
BUN: 13 mg/dL (ref 6–23)
CO2: 26 mEq/L (ref 19–32)
Calcium: 9 mg/dL (ref 8.4–10.5)
Chloride: 102 mEq/L (ref 96–112)
Creatinine, Ser: 0.82 mg/dL (ref 0.50–1.10)
GFR calc Af Amer: >90 mL/min (ref >90)
GFR calc non Af Amer: 84 mL/min — ABNORMAL LOW (ref >90)
Glucose, Bld: 99 mg/dL (ref 70–99)
Potassium: 4.3 mEq/L (ref 3.7–5.3)
Sodium: 141 mEq/L (ref 137–147)
Total Bilirubin: 0.4 mg/dL (ref 0.3–1.2)
Total Protein: 7.3 g/dL (ref 6.0–8.3)

## 2014-02-28 LAB — CBC WITH DIFFERENTIAL/PLATELET
Basophils Absolute: 0 10*3/uL (ref 0.0–0.1)
Basophils Relative: 0 % (ref 0–1)
Eosinophils Absolute: 0.1 10*3/uL (ref 0.0–0.7)
Eosinophils Relative: 1 % (ref 0–5)
HCT: 40.9 % (ref 36.0–46.0)
Hemoglobin: 14.2 g/dL (ref 12.0–15.0)
Lymphocytes Relative: 15 % (ref 12–46)
Lymphs Abs: 1.5 10*3/uL (ref 0.7–4.0)
MCH: 31 pg (ref 26.0–34.0)
MCHC: 34.7 g/dL (ref 30.0–36.0)
MCV: 89.3 fL (ref 78.0–100.0)
Monocytes Absolute: 0.4 10*3/uL (ref 0.1–1.0)
Monocytes Relative: 4 % (ref 3–12)
Neutro Abs: 8.3 10*3/uL — ABNORMAL HIGH (ref 1.7–7.7)
Neutrophils Relative %: 80 % — ABNORMAL HIGH (ref 43–77)
Platelets: 212 10*3/uL (ref 150–400)
RBC: 4.58 MIL/uL (ref 3.87–5.11)
RDW: 12.7 % (ref 11.5–15.5)
WBC: 10.4 10*3/uL (ref 4.0–10.5)

## 2014-02-28 LAB — WET PREP, GENITAL
Clue Cells Wet Prep HPF POC: NONE SEEN
Trich, Wet Prep: NONE SEEN
Yeast Wet Prep HPF POC: NONE SEEN

## 2014-02-28 LAB — URINALYSIS, ROUTINE W REFLEX MICROSCOPIC
Bilirubin Urine: NEGATIVE
Glucose, UA: NEGATIVE mg/dL
Hgb urine dipstick: NEGATIVE
Ketones, ur: NEGATIVE mg/dL
Leukocytes, UA: NEGATIVE
Nitrite: NEGATIVE
Protein, ur: NEGATIVE mg/dL
Specific Gravity, Urine: 1.024 (ref 1.005–1.030)
Urobilinogen, UA: 0.2 mg/dL (ref 0.0–1.0)
pH: 5.5 (ref 5.0–8.0)

## 2014-02-28 LAB — LIPASE, BLOOD: Lipase: 26 U/L (ref 11–59)

## 2014-02-28 MED ORDER — OMEPRAZOLE 20 MG PO CPDR: 20.0000 mg | DELAYED_RELEASE_CAPSULE | Freq: Every day | ORAL | Status: DC

## 2014-02-28 MED ORDER — PANTOPRAZOLE SODIUM 40 MG PO TBEC
40.0000 mg | DELAYED_RELEASE_TABLET | Freq: Once | ORAL | Status: AC
  Administered 2014-02-28: 40 mg via ORAL
  Filled 2014-02-28: qty 1

## 2014-02-28 MED ORDER — TRAMADOL HCL 50 MG PO TABS: 50.0000 mg | ORAL_TABLET | Freq: Four times a day (QID) | ORAL | Status: DC | PRN

## 2014-02-28 MED ORDER — GI COCKTAIL ~~LOC~~
30.0000 mL | Freq: Once | ORAL | Status: AC
  Administered 2014-02-28: 30 mL via ORAL
  Filled 2014-02-28: qty 30

## 2014-02-28 NOTE — ED Notes (Signed)
Patient transported to Ultrasound 

## 2014-02-28 NOTE — ED Notes (Signed)
Patient arrives with complaint of abdominal pain. States that it has been ongoing for several days. Patient explains that she saw her PCP at [facility] in that time and numerous tests have been run. Endorses having a contrasted abdominal CT done yesterday but didn't get final result from PCP as of yet. Presents this morning because the pain became worse, awoke her from sleep, began to move to her back, and became more diffuse. Denies nausea, vomiting, diarrhea, urinary symptoms, and signs of bleeding anywhere.

## 2014-02-28 NOTE — ED Notes (Signed)
PA at bedside.

## 2014-02-28 NOTE — Discharge Instructions (Signed)
Your lower abdominal pain may be related to a uterine fibroid.  Take pain medication as needed.  Follow up closely with your primary care provider.  Avoid taking ibuprofen as it may upset your stomach.    Fibroids Fibroids are lumps (tumors) that can occur any place in a woman's body. These lumps are not cancerous. Fibroids vary in size, weight, and where they grow. HOME CARE  Do not take aspirin.  Write down the number of pads or tampons you use during your period. Tell your doctor. This can help determine the best treatment for you. GET HELP RIGHT AWAY IF:  You have pain in your lower belly (abdomen) that is not helped with medicine.  You have cramps that are not helped with medicine.  You have more bleeding between or during your period.  You feel lightheaded or pass out (faint).  Your lower belly pain gets worse. MAKE SURE YOU:  Understand these instructions.  Will watch your condition.  Will get help right away if you are not doing well or get worse. Document Released: 06/11/2010 Document Revised: 08/01/2011 Document Reviewed: 06/11/2010 [facility] Patient Information 2015 [place], [place]. This information is not intended to replace advice given to you by your health care provider. Make sure you discuss any questions you have with your health care provider.

## 2014-02-28 NOTE — ED Provider Notes (Signed)
CSN: [id number]     Arrival date & time 02/28/14  8676 History   First MD Initiated Contact with Patient 02/28/14 [phone]     Chief Complaint  Patient presents with  . Abdominal Pain     (Consider location/radiation/quality/duration/timing/severity/associated sxs/prior Treatment) HPI  48 year old female in with history of recurrent pancreatitis, chronic headache, cholelithiasis status post cholecystectomy presenting complaining of abdominal pain. Patient reports intermittent left lower quadrant abdominal pain ongoing for the past week. Pain is sharp and achy sensation, and that cause her discomfort including nausea. She has been taking ibuprofen with some relief. She went to her primary care Dr. yesterday for evaluation of her lower bowel pain. Report she has a battery of tests done and an abdominal and pelvis CT scan. She has not seen the CT scan results. This morning she report waking up complaining of burning sensation to the epigastric region, radiating across the chest and to her back. Pain has been persistent, 8/10, nothing seems to make it better or worse. No associated fever, productive cough, hemoptysis, lightheadedness, dizziness, chest pain, shortness of breath, dysuria, hematuria, hematochezia melena, or rash. Denies any strenuous activities or recent trauma. No prior history of PE or DVT, no recent surgery, prolonged bed rest, unilateral leg swelling of calf pain, birth control, or having active cancer. She does not have any significant cardiac history, she is a nonsmoker.  Past Medical History  Diagnosis Date  . EXTERNAL HEMORRHOIDS 02/18/2010  . VARICOSE VEIN 01/06/2010  . GERD 03/13/2007  . HIP PAIN, RIGHT 02/23/2009  . LOW BACK PAIN, MILD 08/06/2008  . FATIGUE 04/13/2007  . PARESTHESIA 03/13/2007  . Headache(784.0) 03/13/2007  . Abdominal pain, epigastric 01/06/2010  . LIVER FUNCTION TESTS, ABNORMAL 01/06/2010  . ESOPHAGITIS, HX OF 02/18/2010  . Pulmonary nodule     incidental on  ct 2007  neg cxray 2011  . Pancreatitis 12/18/09  . Chronic headaches   . Esophagitis   . Gallstones    Past Surgical History  Procedure Laterality Date  . Cholecystectomy    . Tonsillectomy    . Tonsillectomy     Family History  Problem Relation Age of Onset  . Alcohol abuse Father   . Cardiomyopathy Father   . Anemia Father   . Heart disease Father   . Other Father     Irrg. Heart Beat  . Lung cancer Mother   . Diabetes Mother   . Other Brother     Irr. Heart Beat  . Pancreatitis Maternal Grandfather   . Colon cancer Maternal Grandmother    History  Substance Use Topics  . Smoking status: Never Smoker   . Smokeless tobacco: Never Used  . Alcohol Use: No     Comment: 2 per month   OB History   Grav Para Term Preterm Abortions TAB SAB Ect Mult Living                 Review of Systems  All other systems reviewed and are negative.     Allergies  Gadolinium; Penicillins; and Sulfonamide derivatives  Home Medications   Prior to Admission medications   Medication Sig Start Date End Date Taking? Authorizing Provider  dexlansoprazole (DEXILANT) 60 MG capsule Take 1 capsule (60 mg total) by mouth daily. 10/18/11  Yes Burnis Medin, MD  ibuprofen (ADVIL,MOTRIN) 200 MG tablet Take 600 mg by mouth every 6 (six) hours as needed for moderate pain.    Yes Historical Provider, MD  promethazine (PHENERGAN) 25 MG tablet  take 1 tablet by mouth every 4 to 6 hours if needed for nausea and vomiting   Yes Burnis Medin, MD  SUMAtriptan (IMITREX) 100 MG tablet Take 100 mg by mouth every 2 (two) hours as needed for migraine. May repeat in 2 hours if headache persists or recurs.   Yes Historical Provider, MD   BP 113/65  Pulse 76  Temp(Src) 98.2 F (36.8 C) (Oral)  Resp 14  SpO2 99%  LMP 02/13/2014 Physical Exam  Nursing note and vitals reviewed. Constitutional: She is oriented to person, place, and time. She appears well-developed and well-nourished. No distress.  HENT:   Head: Normocephalic and atraumatic.  Eyes: Conjunctivae are normal.  Neck: Normal range of motion. Neck supple.  Cardiovascular: Normal rate and regular rhythm.   Pulmonary/Chest: Effort normal and breath sounds normal. She exhibits no tenderness.  Abdominal: Soft. There is tenderness (Very mild epigastric tenderness and left lower quadrant tenderness on palpation without guarding or rebound tenderness. No rash noted.).  Genitourinary: Vagina normal and uterus normal. There is no rash or lesion on the right labia. There is no rash or lesion on the left labia. Cervix exhibits no motion tenderness and no discharge. Right adnexum displays no mass and no tenderness. Left adnexum displays no mass and no tenderness. No erythema, tenderness or bleeding around the vagina. No vaginal discharge found.  Chaperone present:  No CVA tenderness  Lymphadenopathy:       Right: No inguinal adenopathy present.       Left: No inguinal adenopathy present.  Neurological: She is alert and oriented to person, place, and time.    ED Course  Procedures (including critical care time)  7:10 AM Patient here with low probable pain that has been ongoing for a week. Patient had a CT scan at her PCP office yesterday and the result demonstrated no significant finding except a 2 cm left ovarian cyst with possible adjacent free fluid in the left adnexal region. On pelvic examination patient has no significant abdominal discomfort pelvic discomfort concerning for PID. Will obtain pelvic ultrasound for further evaluation. Patient also complaining of epigastric tenderness this morning. Patient did take ibuprofen last night which may account for her symptoms. She has no significant cough, hypoxia or fever to suggest pneumonia. She is in no acute distress at this time. GI cocktail given for her epigastric pain which has provided some improvement.  CAre discussed with Dr. Sharol Given.  9:29 AM Per result is unremarkable, urine without  evidence of urinary tract infection, normal H&H, electrolytes are reassuring, pelvic ultrasound with a small left fundal uterine fibroid but no other acute abnormality. Patient report improvement of pain after receiving GI cocktail and she is now stable for discharge. She will follow closely with her primary care Dr. for further care. Return precautions discussed. Recommend taking PPI and H2 as needed, avoid NSAIDs in the meantime.  Labs Review Labs Reviewed  WET PREP, GENITAL - Abnormal; Notable for the following:    WBC, Wet Prep HPF POC FEW (*)    All other components within normal limits  CBC WITH DIFFERENTIAL - Abnormal; Notable for the following:    Neutrophils Relative % 80 (*)    Neutro Abs 8.3 (*)    All other components within normal limits  COMPREHENSIVE METABOLIC PANEL - Abnormal; Notable for the following:    AST 67 (*)    ALT 41 (*)    GFR calc non Af Amer 84 (*)    All other components  within normal limits  URINALYSIS, ROUTINE W REFLEX MICROSCOPIC - Abnormal; Notable for the following:    APPearance CLOUDY (*)    All other components within normal limits  URINE CULTURE  GC/CHLAMYDIA PROBE AMP  LIPASE, BLOOD    Imaging Review US Transvaginal Non-ob  02/28/2014   CLINICAL DATA:  Left lower quadrant pain  EXAM: TRANSABDOMINAL AND TRANSVAGINAL ULTRASOUND OF PELVIS  TECHNIQUE: Both transabdominal and transvaginal ultrasound examinations of the pelvis were performed. Transabdominal technique was performed for global imaging of the pelvis including uterus, ovaries, adnexal regions, and pelvic cul-de-sac. It was necessary to proceed with endovaginal exam following the transabdominal exam to visualize the endometrium and ovaries.  COMPARISON:  None  FINDINGS: Uterus  Measurements: 7.4 x 3.0 x 6.1 cm. There is a 2.7 x 2.4 x 2.9 cm left fundal hypoechoic mass most consistent with a fibroid.  Endometrium  Thickness: 6.5 mm.  No focal abnormality visualized.  Right ovary  Measurements:  2.9 x 1.8 x 2.6 cm. Normal appearance/no adnexal mass.  Left ovary  Measurements: 4.5 x 2.1 x 2.4 cm. Normal appearance/no adnexal mass.  Other findings  Trace pelvic free fluid.  IMPRESSION: 1. Small left fundal uterine fibroid.   Electronically Signed   By: Kathreen Devoid   On: 02/28/2014 08:39   US Pelvis Complete  02/28/2014   CLINICAL DATA:  Left lower quadrant pain  EXAM: TRANSABDOMINAL AND TRANSVAGINAL ULTRASOUND OF PELVIS  TECHNIQUE: Both transabdominal and transvaginal ultrasound examinations of the pelvis were performed. Transabdominal technique was performed for global imaging of the pelvis including uterus, ovaries, adnexal regions, and pelvic cul-de-sac. It was necessary to proceed with endovaginal exam following the transabdominal exam to visualize the endometrium and ovaries.  COMPARISON:  None  FINDINGS: Uterus  Measurements: 7.4 x 3.0 x 6.1 cm. There is a 2.7 x 2.4 x 2.9 cm left fundal hypoechoic mass most consistent with a fibroid.  Endometrium  Thickness: 6.5 mm.  No focal abnormality visualized.  Right ovary  Measurements: 2.9 x 1.8 x 2.6 cm. Normal appearance/no adnexal mass.  Left ovary  Measurements: 4.5 x 2.1 x 2.4 cm. Normal appearance/no adnexal mass.  Other findings  Trace pelvic free fluid.  IMPRESSION: 1. Small left fundal uterine fibroid.   Electronically Signed   By: Kathreen Devoid   On: 02/28/2014 08:39   Ct Abdomen Pelvis W Contrast  02/27/2014   CLINICAL DATA:  Acute left lower quadrant abdominal pain.  EXAM: CT ABDOMEN AND PELVIS WITH CONTRAST  TECHNIQUE: Multidetector CT imaging of the abdomen and pelvis was performed using the standard protocol following bolus administration of intravenous contrast.  CONTRAST:  170mL OMNIPAQUE IOHEXOL 300 MG/ML  SOLN  COMPARISON:  None.  FINDINGS: Visualized lung bases appear normal. No significant osseous abnormality is noted.  Status post cholecystectomy. The liver, spleen and pancreas appear normal. Adrenal glands and kidneys appear normal.  No hydronephrosis or renal obstruction is noted. The appendix appears normal. There is no evidence of bowel obstruction. No abnormal fluid collection is noted. Calcified fibroid is noted in the uterus fundus. Urinary bladder appears normal. No significant adenopathy is noted. Right ovary appears normal. 2 cm left ovarian cyst is noted. There may be some free fluid in the left adnexal region.  IMPRESSION: No hydronephrosis or renal obstruction is noted. No renal or ureteral calculi are noted.  2 cm left ovarian cyst is noted with possible adjacent free fluid in the left adnexal region. Pelvic ultrasound may be performed for further evaluation.  Electronically Signed   By: Sabino Dick M.D.   On: 02/27/2014 14:35     EKG Interpretation None      MDM   Final diagnoses:  Heart burn  Uterine leiomyoma, unspecified location    BP 104/57  Pulse 67  Temp(Src) 98.1 F (36.7 C) (Oral)  Resp 18  SpO2 97%  LMP 02/13/2014  I have reviewed nursing notes and vital signs. I personally reviewed the imaging tests through PACS system  I reviewed available ER/hospitalization records thought the EMR     Domenic Moras, [place] 02/28/14 0017

## 2014-02-28 NOTE — ED Provider Notes (Signed)
Medical screening examination/treatment/procedure(s) were performed by non-physician practitioner and as supervising physician I was immediately available for consultation/collaboration.   EKG Interpretation None       Kalman Drape, MD 02/28/14 [phone]

## 2014-03-01 LAB — GC/CHLAMYDIA PROBE AMP
CT Probe RNA: NEGATIVE
GC Probe RNA: NEGATIVE

## 2014-03-01 LAB — URINE CULTURE: Colony Count: >=100000

## 2014-03-03 ENCOUNTER — Other Ambulatory Visit: Payer: Self-pay | Admitting: Internal Medicine

## 2014-03-03 ENCOUNTER — Telehealth: Payer: Self-pay | Admitting: Family Medicine

## 2014-03-03 NOTE — Telephone Encounter (Signed)
Dr. Regis Bill would like to know how the patient's abdominal pain is doing.  Will call and forward the information to [facility].

## 2014-03-03 NOTE — Telephone Encounter (Signed)
Sent to the pharmacy by e-scribe. 

## 2014-03-03 NOTE — Telephone Encounter (Signed)
Spoke to the pt.  She says her pain is much better than it was but continues to experience discomfort.  Also still has abdominal swelling.  She has an appt to see Ardyth Gal, PA on 03/04/14 @ 11:10.

## 2014-03-03 NOTE — Telephone Encounter (Signed)
Ok to refill x 3  How is she doing with the abd pelvic pain?

## 2014-03-04 ENCOUNTER — Encounter: Payer: Self-pay | Admitting: Internal Medicine

## 2014-03-13 ENCOUNTER — Other Ambulatory Visit: Payer: Self-pay | Admitting: Obstetrics & Gynecology

## 2014-03-17 LAB — CYTOLOGY - PAP

## 2014-03-27 ENCOUNTER — Ambulatory Visit (INDEPENDENT_AMBULATORY_CARE_PROVIDER_SITE_OTHER): Payer: BC Managed Care – PPO | Admitting: Internal Medicine

## 2014-03-27 ENCOUNTER — Encounter: Payer: Self-pay | Admitting: Internal Medicine

## 2014-03-27 VITALS — BP 126/82 | HR 79 | Temp 98.9°F | Ht 67.25 in | Wt 170.0 lb

## 2014-03-27 DIAGNOSIS — R05 Cough: Secondary | ICD-10-CM

## 2014-03-27 DIAGNOSIS — R053 Chronic cough: Secondary | ICD-10-CM

## 2014-03-27 DIAGNOSIS — R058 Other specified cough: Secondary | ICD-10-CM

## 2014-03-27 MED ORDER — HYDROCODONE-HOMATROPINE 5-1.5 MG/5ML PO SYRP: 5.0000 mL | ORAL_SOLUTION | ORAL | Status: DC | PRN

## 2014-03-27 NOTE — Patient Instructions (Signed)
Cough acts like upper airway irritable cough syndrome . Can occur after resp infection  Resolved. Take antireflux medication for at least 2-3 weeks. avoid cough drops and other airway irritants supplements with oils. First generation antihistamine such as copernicia may help if post nasal drip. Add INCS  every day . Cough med as needed . Steam warm liquids  . If not a lot better in 1-2 weeks contact us  consider short course prednisone

## 2014-03-27 NOTE — Progress Notes (Signed)
Pre visit review using our clinic review tool, if applicable. No additional management support is needed unless otherwise documented below in the visit note.  Chief Complaint  Patient presents with  . Cough    Ongoing for 2 months    HPI: Patient Debra [name]  comes in today for SDA for  new problem evaluation. Last seen last month for abd sx  Ended  Up being ruptured ovarian cyst and saw  Gyne doing well now.  However  had a uri type sx about 2 months ago better but has a persistent cough and laryngeal type spasm with it  without fever cp sob or wheezing . nyquil some help at night but got HA migraine.  Off her ppi for reflux  No sob some nasal  congestion no sneezing allergy otherwise  Neg ets no cough drops.   ROS: See pertinent positives and negatives per HPI.no nvd   Past Medical History  Diagnosis Date  . EXTERNAL HEMORRHOIDS 02/18/2010  . VARICOSE VEIN 01/06/2010  . GERD 03/13/2007  . HIP PAIN, RIGHT 02/23/2009  . LOW BACK PAIN, MILD 08/06/2008  . FATIGUE 04/13/2007  . PARESTHESIA 03/13/2007  . Headache(784.0) 03/13/2007  . Abdominal pain, epigastric 01/06/2010  . LIVER FUNCTION TESTS, ABNORMAL 01/06/2010  . ESOPHAGITIS, HX OF 02/18/2010  . Pulmonary nodule     incidental on ct 2007  neg cxray 2011  . Pancreatitis 12/18/09  . Chronic headaches   . Esophagitis   . Gallstones     Family History  Problem Relation Age of Onset  . Alcohol abuse Father   . Cardiomyopathy Father   . Anemia Father   . Heart disease Father   . Other Father     Irrg. Heart Beat  . Lung cancer Mother   . Diabetes Mother   . Other Brother     Irr. Heart Beat  . Pancreatitis Maternal Grandfather   . Colon cancer Maternal Grandmother     History   Social History  . Marital Status: Married    Spouse Name: N/A    Number of Children: 0  . Years of Education: N/A   Occupational History  . PHARMACIST    Social History Main Topics  . Smoking status: Never Smoker   . Smokeless  tobacco: Never Used  . Alcohol Use: No     Comment: 2 per month  . Drug Use: No  . Sexual Activity: None   Other Topics Concern  . 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    hx fertility treatment    hh of 2   6- 8 hours  Sleep    3 diet coke per day   2 etoh per month                       Outpatient Encounter Prescriptions as of 03/27/2014  Medication Sig  . dexlansoprazole (DEXILANT) 60 MG capsule Take 1 capsule (60 mg total) by mouth daily.  Marland Kitchen ibuprofen (ADVIL,MOTRIN) 200 MG tablet Take 600 mg by mouth every 6 (six) hours as needed for moderate pain.   . promethazine (PHENERGAN) 25 MG tablet take 1 tablet by mouth every 4 to 6 hours if needed for nausea and vomiting  . SUMAtriptan (IMITREX) 100 MG tablet Take 100 mg by mouth every 2 (two) hours as needed for migraine. May repeat in 2 hours if headache persists or recurs.  Marland Kitchen  SUMAtriptan (IMITREX) 100 MG tablet take 1 tablet by mouth if needed for headache MAY REPEAT IN 2 HOURS IF NEEDED  . HYDROcodone-homatropine (HYCODAN) 5-1.5 MG/5ML syrup Take 5 mLs by mouth every 4 (four) hours as needed for cough. 1-2 tsp  . [DISCONTINUED] omeprazole (PRILOSEC) 20 MG capsule Take 1 capsule (20 mg total) by mouth daily.  . [DISCONTINUED] traMADol (ULTRAM) 50 MG tablet Take 1 tablet (50 mg total) by mouth every 6 (six) hours as needed.    EXAM:  BP 126/82 mmHg  Pulse 79  Temp(Src) 98.9 F (37.2 C) (Oral)  Ht 5' 7.25" (1.708 m)  Wt 170 lb (77.111 kg)  BMI 26.43 kg/m2  SpO2 98%  LMP 02/13/2014  Body mass index is 26.43 kg/(m^2).  GENERAL: vitals reviewed and listed above, alert, oriented, appears well hydrated and in no acute distress quiet respiration some throat clearing  HEENT: atraumatic, conjunctiva  clear, no obvious abnormalities on inspection of external nose and ears OP : no lesion edema or exudate  Drainage tracts seen  Face nt  NECK: no obvious masses on inspection palpation  no adenopathy  LUNGS: clear to auscultation bilaterally, no wheezes, rales or rhonchi, good air movement CV: HRRR, no clubbing cyanosis or  peripheral edema nl cap refill  MS: moves all extremities without noticeable focal  abnormality PSYCH: pleasant and cooperative, no obvious depression or anxiety  ASSESSMENT AND PLAN:  Discussed the following assessment and plan:  Cough, persistent  Upper airway cough syndrome - poss gerd elr also  Disc poss cause and rx intervnetion.  If neeed consider short pred course  Contact us if  persistent or progressive  -Patient advised to return or notify health care team  if symptoms worsen ,persist or new concerns arise.  Patient Instructions  Cough acts like upper airway irritable cough syndrome . Can occur after resp infection  Resolved. Take antireflux medication for at least 2-3 weeks. avoid cough drops and other airway irritants supplements with oils. First generation antihistamine such as copernicia may help if post nasal drip. Add INCS  every day . Cough med as needed . Steam warm liquids  . If not a lot better in 1-2 weeks contact us  consider short course prednisone     Standley Brooking. [place] Terrones M.D.

## 2014-04-01 ENCOUNTER — Encounter: Payer: Self-pay | Admitting: Internal Medicine

## 2014-04-02 MED ORDER — PREDNISONE 20 MG PO TABS: ORAL_TABLET | ORAL | Status: DC

## 2014-04-02 NOTE — Telephone Encounter (Signed)
Itching can occure with hydrocodone at any time  And may not be allergic   Just istamine release.  Will send in pred for 5 days and see how it goes

## 2014-04-09 MED ORDER — DEXLANSOPRAZOLE 60 MG PO CPDR: 60.0000 mg | DELAYED_RELEASE_CAPSULE | Freq: Every day | ORAL | Status: DC

## 2014-04-09 MED ORDER — HYDROCOD POLST-CHLORPHEN POLST 10-8 MG/5ML PO LQCR: 5.0000 mL | Freq: Two times a day (BID) | ORAL | Status: DC | PRN

## 2014-04-09 NOTE — Telephone Encounter (Signed)
Ok to rx tussionex disp 60 cc 5 cc q 12 hours as needed for cough   Refill dexilant for 3 months . If not getting better then plan  Fu.

## 2014-08-13 ENCOUNTER — Telehealth: Payer: Self-pay | Admitting: Internal Medicine

## 2014-08-13 NOTE — Telephone Encounter (Signed)
Pt had nausea 4 days and now continued abdominal swelling for 6 days. Fatique, but no fever,  Would like to see der panosh tomorrow.  Only SD avaiable. Is it ok to schedule?

## 2014-08-13 NOTE — Telephone Encounter (Signed)
Pt has been scheduled and transferred to triage

## 2014-08-13 NOTE — Telephone Encounter (Signed)
Patient Name: Debra [name]  DOB: Sep 30, 1965    Initial Comment Caller states she has had abdomen pain, nausea, and abdominal swelling   Nurse Assessment  Nurse: Thad Ranger, RN, Denise Date/Time (Eastern Time): 08/13/2014 3:20:14 PM  Confirm and document reason for call. If symptomatic, describe symptoms. ---Caller states she has had abdomen pain, nausea, and abdominal swelling. States BM wnl. Denies diarrhea/vomiting. Appt sched tomorrow at 1030am.  Has the patient traveled out of the country within the last 30 days? ---Not Applicable  Does the patient require triage? ---Yes  Related visit to physician within the last 2 weeks? ---No  Does the PT have any chronic conditions? (i.e. diabetes, asthma, etc.) ---No  Did the patient indicate they were pregnant? ---No     Guidelines    Guideline Title Affirmed Question Affirmed Notes  Abdominal Pain - Female [1] MILD (e.g., does not interfere with normal activities) AND [2] pain comes and goes (cramps) AND [3] present > 48 hours Denies pain currently. State she has intermittent pain that is 1/10 on scale and started 6 days ago. Advised to keep the appt as sched in the am with Dr Regis Bill at 1030am.   Final Disposition User   See Physician within 24 Hours Carmon, RN, Langley Gauss    Comments  Pt already has an appt with Dr Regis Bill on 08/14/14 at 1030am.

## 2014-08-13 NOTE — Telephone Encounter (Signed)
Pt needs 30 minute appt.  Please schedule

## 2014-08-13 NOTE — Telephone Encounter (Signed)
Please schedule as discussed and send to triage

## 2014-08-13 NOTE — Telephone Encounter (Signed)
Do you want me to do this (another 30 min) tomorrow ?

## 2014-08-14 ENCOUNTER — Encounter: Payer: Self-pay | Admitting: Internal Medicine

## 2014-08-14 ENCOUNTER — Ambulatory Visit (INDEPENDENT_AMBULATORY_CARE_PROVIDER_SITE_OTHER): Payer: BLUE CROSS/BLUE SHIELD | Admitting: Internal Medicine

## 2014-08-14 VITALS — BP 100/60 | Temp 98.7°F | Ht 67.0 in | Wt 171.0 lb

## 2014-08-14 DIAGNOSIS — R140 Abdominal distension (gaseous): Secondary | ICD-10-CM

## 2014-08-14 DIAGNOSIS — R1012 Left upper quadrant pain: Secondary | ICD-10-CM

## 2014-08-14 DIAGNOSIS — R5383 Other fatigue: Secondary | ICD-10-CM | POA: Diagnosis not present

## 2014-08-14 NOTE — Progress Notes (Signed)
Pre visit review using our clinic review tool, if applicable. No additional management support is needed unless otherwise documented below in the visit note.   Chief Complaint  Patient presents with  . Intermittent Abdominal Pain    [id number]  . Nausea  . Bloated    HPI: Patient Debra [name]  comes in today for SDA for  new problem evaluation. 1 week hx of onset luq intermittent pain and bloating and Nausea .  better today but feels distended.   No fever    Daughter  Now 22 months  And   In day care   Last week had vomiting x 1 had ear  Infections.  Gi bugs goin around but no diarrhea  No meds pain is better and now just bloated  And full no uti sx  Periods   3 weeks .  Ago  Constipation  Some times  But not now  Every other day .  Stools light tan   No bleed.  No change .  Not on  Pain meds for has  Or cough no blood in stool.  ROS: See pertinent positives and negatives per HPI. Has hx of ibs but this is a bit different .  Than usual Those sx usually go away quickly  Has fatigue but toddler at home 22 months  Past Medical History  Diagnosis Date  . EXTERNAL HEMORRHOIDS 02/18/2010  . VARICOSE VEIN 01/06/2010  . GERD 03/13/2007  . HIP PAIN, RIGHT 02/23/2009  . LOW BACK PAIN, MILD 08/06/2008  . FATIGUE 04/13/2007  . PARESTHESIA 03/13/2007  . Headache(784.0) 03/13/2007  . Abdominal pain, epigastric 01/06/2010  . LIVER FUNCTION TESTS, ABNORMAL 01/06/2010  . ESOPHAGITIS, HX OF 02/18/2010  . Pulmonary nodule     incidental on ct 2007  neg cxray 2011  . Pancreatitis 12/18/09  . Chronic headaches   . Esophagitis   . Gallstones   . Ruptured ovarian cyst     Family History  Problem Relation Age of Onset  . Alcohol abuse Father   . Cardiomyopathy Father   . Anemia Father   . Heart disease Father   . Other Father     Irrg. Heart Beat  . Lung cancer Mother   . Diabetes Mother   . Other Brother     Irr. Heart Beat  . Pancreatitis Maternal Grandfather   . Colon cancer Maternal  Grandmother     History   Social History  . Marital Status: Married    Spouse Name: N/A  . Number of Children: 0  . Years of Education: N/A   Occupational History  . PHARMACIST    Social History Main Topics  . Smoking status: Never Smoker   . Smokeless tobacco: Never Used  . Alcohol Use: No     Comment: 2 per month  . Drug Use: No  . Sexual Activity: Not on file   Other Topics Concern  . 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    hx fertility treatment    hh of 2   6- 8 hours  Sleep    3 diet coke per day   2 etoh per month    65 month old adopted at 7 weeks  gracie                       Outpatient Encounter Prescriptions as of 08/14/2014  Medication Sig  .  dexlansoprazole (DEXILANT) 60 MG capsule Take 1 capsule (60 mg total) by mouth daily.  Marland Kitchen ibuprofen (ADVIL,MOTRIN) 200 MG tablet Take 600 mg by mouth every 6 (six) hours as needed for moderate pain.   . promethazine (PHENERGAN) 25 MG tablet take 1 tablet by mouth every 4 to 6 hours if needed for nausea and vomiting  . SUMAtriptan (IMITREX) 100 MG tablet Take 100 mg by mouth every 2 (two) hours as needed for migraine. May repeat in 2 hours if headache persists or recurs.  . SUMAtriptan (IMITREX) 100 MG tablet take 1 tablet by mouth if needed for headache MAY REPEAT IN 2 HOURS IF NEEDED  . [DISCONTINUED] chlorpheniramine-HYDROcodone (TUSSIONEX) 10-8 MG/5ML LQCR Take 5 mLs by mouth every 12 (twelve) hours as needed for cough.  . [DISCONTINUED] HYDROcodone-homatropine (HYCODAN) 5-1.5 MG/5ML syrup Take 5 mLs by mouth every 4 (four) hours as needed for cough. 1-2 tsp  . [DISCONTINUED] predniSONE (DELTASONE) 20 MG tablet Take 3 po qd for 2 days then 2 po qd for 3 days,or as directed    EXAM:  BP 100/60 mmHg  Temp(Src) 98.7 F (37.1 C) (Oral)  Ht 5\' 7"  (1.702 m)  Wt 171 lb (77.565 kg)  BMI 26.78 kg/m2  Body mass index is 26.78 kg/(m^2).  GENERAL: vitals reviewed and  listed above, alert, oriented, appears well hydrated and in no acute distress HEENT: atraumatic, conjunctiva  clear, no obvious abnormalities on inspection of external nose and ears OP : no lesion edema or exudate  Has braces  NECK: no obvious masses on inspection palpation  LUNGS: clear to auscultation bilaterally, no wheezes, rales or rhonchi, good air movement CV: HRRR, no clubbing cyanosis or  peripheral edema nl cap refill  Abdomen:  Sof,t normal bowel sounds without hepatosplenomegaly, no guarding rebound or masses no CVA tenderness No focal pain masses  Standing looks protuberant but no fluid wave and soft on supine MS: moves all extremities without noticeable focal  abnormality PSYCH: pleasant and cooperative, no obvious depression or anxiety  ASSESSMENT AND PLAN:  Discussed the following assessment and plan:  LUQ pain  Abdominal bloating  Other fatigue This may be ibs plus minus a gib bug exam reassuring  Could be  Splenic flexures syndrome   Add probiotic  Off the dexilant can add back but not compelling reason .   Dietary  Monitoring fodmap   If pain  persistent or progressive then can recheck  Last ct abd pel fall 15 neg except ovarian cyst Poss ibs sx  Will follow see past evaluations for gastritis gerd  -Patient advised to return or notify health care team  if symptoms worsen ,persist or new concerns arise. Total visit 65mins > 50% spent counseling and coordinating care   Patient Instructions  This may be a version of IBS  With left side gas trapping causing the pain . Exam is reassuring today . Begin probiotic    Every day .    If getting recurring pain .   After a few weeks . We can recheck in a few weeks if not getting better .    fodmap diet can be helpful for IBS sx also .      Standley Brooking. Panosh M.D.

## 2014-08-14 NOTE — Patient Instructions (Signed)
This may be a version of IBS  With left side gas trapping causing the pain . Exam is reassuring today . Begin probiotic    Every day .    If getting recurring pain .   After a few weeks . We can recheck in a few weeks if not getting better .    fodmap diet can be helpful for IBS sx also .

## 2014-08-29 ENCOUNTER — Other Ambulatory Visit: Payer: Self-pay | Admitting: Internal Medicine

## 2015-01-01 ENCOUNTER — Encounter: Payer: Self-pay | Admitting: Family Medicine

## 2015-01-01 ENCOUNTER — Ambulatory Visit (INDEPENDENT_AMBULATORY_CARE_PROVIDER_SITE_OTHER): Payer: BLUE CROSS/BLUE SHIELD | Admitting: Family Medicine

## 2015-01-01 VITALS — BP 126/80 | HR 90 | Temp 98.1°F | Wt 171.0 lb

## 2015-01-01 DIAGNOSIS — R42 Dizziness and giddiness: Secondary | ICD-10-CM

## 2015-01-01 DIAGNOSIS — M7582 Other shoulder lesions, left shoulder: Secondary | ICD-10-CM | POA: Diagnosis not present

## 2015-01-01 DIAGNOSIS — R51 Headache: Secondary | ICD-10-CM | POA: Diagnosis not present

## 2015-01-01 DIAGNOSIS — R5383 Other fatigue: Secondary | ICD-10-CM

## 2015-01-01 DIAGNOSIS — R519 Headache, unspecified: Secondary | ICD-10-CM

## 2015-01-01 LAB — CBC WITH DIFFERENTIAL/PLATELET
Basophils Absolute: 0.1 10*3/uL (ref 0.0–0.1)
Basophils Relative: 0.8 % (ref 0.0–3.0)
Eosinophils Absolute: 0.2 10*3/uL (ref 0.0–0.7)
Eosinophils Relative: 2.9 % (ref 0.0–5.0)
HCT: 41.5 % (ref 36.0–46.0)
Hemoglobin: 14 g/dL (ref 12.0–15.0)
Lymphocytes Relative: 28.4 % (ref 12.0–46.0)
Lymphs Abs: 1.9 10*3/uL (ref 0.7–4.0)
MCHC: 33.7 g/dL (ref 30.0–36.0)
MCV: 93.8 fl (ref 78.0–100.0)
Monocytes Absolute: 0.5 10*3/uL (ref 0.1–1.0)
Monocytes Relative: 7.5 % (ref 3.0–12.0)
Neutro Abs: 4.1 10*3/uL (ref 1.4–7.7)
Neutrophils Relative %: 60.4 % (ref 43.0–77.0)
Platelets: 263 10*3/uL (ref 150.0–400.0)
RBC: 4.42 Mil/uL (ref 3.87–5.11)
RDW: 13.7 % (ref 11.5–15.5)
WBC: 6.9 10*3/uL (ref 4.0–10.5)

## 2015-01-01 LAB — BASIC METABOLIC PANEL
BUN: 17 mg/dL (ref 6–23)
CO2: 29 mEq/L (ref 19–32)
Calcium: 9.1 mg/dL (ref 8.4–10.5)
Chloride: 104 mEq/L (ref 96–112)
Creatinine, Ser: 0.82 mg/dL (ref 0.40–1.20)
GFR: 79.04 mL/min (ref >60.00)
Glucose, Bld: 88 mg/dL (ref 70–99)
Potassium: 4.5 mEq/L (ref 3.5–5.1)
Sodium: 139 mEq/L (ref 135–145)

## 2015-01-01 LAB — TSH: TSH: 1.51 u[IU]/mL (ref 0.35–4.50)

## 2015-01-01 LAB — VITAMIN B12: Vitamin B-12: 264 pg/mL (ref 211–911)

## 2015-01-01 NOTE — Progress Notes (Signed)
Pre visit review using our clinic review tool, if applicable. No additional management support is needed unless otherwise documented below in the visit note. 

## 2015-01-01 NOTE — Progress Notes (Signed)
Subjective:    Patient ID: Debra [name], female    DOB: 27-Nov-1965, 49 y.o.   MRN: [id number]  HPI Patient seen as acute visit with chief complaint of feeling "off balance" and mild vague dizziness and lightheadedness along with intermittent headaches over the past week. She does have history of migraine headache and had what sound like a typical migraine type headache last week but has had some intermittent mild more dull diffuse headache since then.  She relates onset about a week ago of lightheadedness. No syncope. She recalls one episode where her daughter came to visit her at her work and she squatted down and lost her balance and fell over. No loss of consciousness. She is not having any true vertigo and denies any orthostatic type symptoms. She does not describe any ataxic type symptoms. No focal weakness. She's not aware of any extremity numbness. She had what sound like a migraine last week which was preceded by some peripheral visual disturbance which sounded like a visual or but these are not typical of most of her migraines. She's not had any loss of vision. Generally gets good relief of migraines with Imitrex  No true confusion but recently had a couple episodes where she had difficulty remembering names.   She has complained of some nonspecific fatigue.  Of note, in looking back through previous labs she had B12 level one year ago 225. No chronic PPI use or any other specific risk factors for B12 deficiency.  Other new issue is left shoulder pain. Present for several months. No specific injury. She's had some pain with weightlifting such as reaching overhead. She has pain mostly with external rotation and abduction. No weakness. No significant night pain. No alleviating factors. Denies cervical radiculopathy pain  Past Medical History  Diagnosis Date  . EXTERNAL HEMORRHOIDS 02/18/2010  . VARICOSE VEIN 01/06/2010  . GERD 03/13/2007  . HIP PAIN, RIGHT 02/23/2009  . LOW BACK PAIN,  MILD 08/06/2008  . FATIGUE 04/13/2007  . PARESTHESIA 03/13/2007  . Headache(784.0) 03/13/2007  . Abdominal pain, epigastric 01/06/2010  . LIVER FUNCTION TESTS, ABNORMAL 01/06/2010  . ESOPHAGITIS, HX OF 02/18/2010  . Pulmonary nodule     incidental on ct 2007  neg cxray 2011  . Pancreatitis 12/18/09  . Chronic headaches   . Esophagitis   . Gallstones   . Ruptured ovarian cyst    Past Surgical History  Procedure Laterality Date  . Cholecystectomy    . Tonsillectomy    . Tonsillectomy      reports that she has never smoked. She has never used smokeless tobacco. She reports that she does not drink alcohol or use illicit drugs. family history includes Alcohol abuse in her father; Anemia in her father; Cardiomyopathy in her father; Colon cancer in her maternal grandmother; Diabetes in her mother; Heart disease in her father; Lung cancer in her mother; Other in her brother and father; Pancreatitis in her maternal grandfather. Allergies  Allergen Reactions  . Gadolinium      Code: HIVES, Desc: Multihance--pt had hives, Onset Date: [id number] IVP Dye  . Penicillins     REACTION: rash  . Sulfonamide Derivatives     REACTION: rash      Review of Systems  Constitutional: Negative for fever, chills, appetite change and unexpected weight change.  HENT: Negative for trouble swallowing.   Eyes: Negative for photophobia.  Respiratory: Negative for cough and shortness of breath.   Cardiovascular: Negative for chest pain.  Gastrointestinal: Negative for  nausea, vomiting and abdominal pain.  Genitourinary: Negative for dysuria.  Skin: Negative for rash.  Neurological: Positive for light-headedness and headaches. Negative for tremors, seizures, syncope and weakness.  Hematological: Negative for adenopathy.  Psychiatric/Behavioral: Negative for confusion.       Objective:   Physical Exam  Constitutional: She is oriented to person, place, and time. She appears well-developed and  well-nourished.  Eyes: EOM are normal. Pupils are equal, round, and reactive to light.  Neck: Neck supple. No thyromegaly present.  Cardiovascular: Normal rate and regular rhythm.  Exam reveals no gallop.   No murmur heard. Pulmonary/Chest: Effort normal and breath sounds normal. No respiratory distress. She has no wheezes. She has no rales.  Musculoskeletal: She exhibits no edema.  No acromioclavicular tenderness. No biceps tenderness. She has pain with external rotation and abduction against resistance.  Lymphadenopathy:    She has no cervical adenopathy.  Neurological: She is alert and oriented to person, place, and time. She has normal reflexes. No cranial nerve deficit. Coordination normal.  Strength is full throughout. Gait normal. Normal cerebellar function.  Skin: No rash noted.          Assessment & Plan:  #1 patient presents with one-week history of constellation of symptoms including fatigue, vague dizziness/lightheadedness, mild intermittent headache which is distinct from her migraines. Nonfocal exam neurologically. Standing blood pressure 116/72-no significant orthostatic drop.  As above, low normal B12 year ago. Start with labs-CBC, basic metabolic panel, TSH, V76. ?consider MRI brain if she continues to feel off balance. This does not sound like vertigo. #2 Left shoulder pain. Suspect rotator cuff tendinitis. We recommended stretches to avoid loss of range of motion. We discussed options including steroid-dependent injection and at this point she wishes to observe. She does not have any obvious weakness to suggest likely rotator cuff tear

## 2015-01-05 ENCOUNTER — Encounter: Payer: Self-pay | Admitting: Family Medicine

## 2015-01-29 ENCOUNTER — Other Ambulatory Visit: Payer: Self-pay | Admitting: Internal Medicine

## 2015-03-13 ENCOUNTER — Telehealth: Payer: Self-pay | Admitting: *Deleted

## 2015-03-13 NOTE — Telephone Encounter (Signed)
Error

## 2015-05-08 ENCOUNTER — Encounter: Payer: Self-pay | Admitting: Family Medicine

## 2015-05-08 ENCOUNTER — Ambulatory Visit (INDEPENDENT_AMBULATORY_CARE_PROVIDER_SITE_OTHER): Payer: BLUE CROSS/BLUE SHIELD | Admitting: Family Medicine

## 2015-05-08 VITALS — BP 98/64 | HR 83 | Temp 97.6°F | Ht 67.0 in | Wt 181.4 lb

## 2015-05-08 DIAGNOSIS — J0100 Acute maxillary sinusitis, unspecified: Secondary | ICD-10-CM | POA: Diagnosis not present

## 2015-05-08 MED ORDER — DOXYCYCLINE HYCLATE 100 MG PO TABS: 100.0000 mg | ORAL_TABLET | Freq: Two times a day (BID) | ORAL | Status: DC

## 2015-05-08 NOTE — Patient Instructions (Signed)
Please start the antibiotic.  INSTRUCTIONS FOR UPPER RESPIRATORY INFECTION:  -plenty of rest and fluids  -nasal saline wash 2-3 times daily (use prepackaged nasal saline or bottled/distilled water if making your own)   -can use AFRIN nasal spray for drainage and nasal congestion - but do NOT use longer then 3-4 days  -can use tylenol (in no history of liver disease) or ibuprofen (if no history of kidney disease, bowel bleeding or significant heart disease) as directed for aches and sorethroat  -in the winter time, using a humidifier at night is helpful (please follow cleaning instructions)  -if you are taking a cough medication - use only as directed, may also try a teaspoon of honey to coat the throat and throat lozenges. If given a cough medication with codeine or hydrocodone or other narcotic please be advised that this contains a strong and  potentially addicting medication. Please follow instructions carefully, take as little as possible and only use AS NEEDED for severe cough. Discuss potential side effects with your pharmacy. Please do not drive or operate machinery while taking these types of medications. Please do not take other sedating medications, drugs or alcohol while taking this medication without discussing with your doctor.  -for sore throat, salt water gargles can help  -follow up if you have fevers, facial pain, tooth pain, difficulty breathing or are worsening or symptoms persist longer then expected  Upper Respiratory Infection, Adult An upper respiratory infection (URI) is also known as the common cold. It is often caused by a type of germ (virus). Colds are easily spread (contagious). You can pass it to others by kissing, coughing, sneezing, or drinking out of the same glass. Usually, you get better in 1 to 3  weeks.  However, the cough can last for even longer. HOME CARE   Only take medicine as told by your doctor. Follow instructions provided above.  Drink enough  water and fluids to keep your pee (urine) clear or pale yellow.  Get plenty of rest.  Return to work when your temperature is < 100 for 24 hours or as told by your doctor. You may use a face mask and wash your hands to stop your cold from spreading. GET HELP RIGHT AWAY IF:   After the first few days, you feel you are getting worse.  You have questions about your medicine.  You have chills, shortness of breath, or red spit (mucus).  You have pain in the face for more then 1-2 days, especially when you bend forward.  You have a fever, puffy (swollen) neck, pain when you swallow, or white spots in the back of your throat.  You have a bad headache, ear pain, sinus pain, or chest pain.  You have a high-pitched whistling sound when you breathe in and out (wheezing).  You cough up blood.  You have sore muscles or a stiff neck. MAKE SURE YOU:   Understand these instructions.  Will watch your condition.  Will get help right away if you are not doing well or get worse. Document Released: 10/26/2007 Document Revised: 08/01/2011 Document Reviewed: 08/14/2013 [facility] Patient Information 2015 [place], [place]. This information is not intended to replace advice given to you by your health care provider. Make sure you discuss any questions you have with your health care provider.

## 2015-05-08 NOTE — Progress Notes (Signed)
HPI:  URI: -started: 9 days ago -symptoms:nasal congestion, sore throat, cough - now with tooth and sinus pain and worsening -denies:fever, SOB, NVD -has tried: OTC options -sick contacts/travel/risks: denies flu exposure, tick exposure or or Ebola risks  ROS: See pertinent positives and negatives per HPI.  Past Medical History  Diagnosis Date  . EXTERNAL HEMORRHOIDS 02/18/2010  . VARICOSE VEIN 01/06/2010  . GERD 03/13/2007  . HIP PAIN, RIGHT 02/23/2009  . LOW BACK PAIN, MILD 08/06/2008  . FATIGUE 04/13/2007  . PARESTHESIA 03/13/2007  . Headache(784.0) 03/13/2007  . Abdominal pain, epigastric 01/06/2010  . LIVER FUNCTION TESTS, ABNORMAL 01/06/2010  . ESOPHAGITIS, HX OF 02/18/2010  . Pulmonary nodule     incidental on ct 2007  neg cxray 2011  . Pancreatitis 12/18/09  . Chronic headaches   . Esophagitis   . Gallstones   . Ruptured ovarian cyst     Past Surgical History  Procedure Laterality Date  . Cholecystectomy    . Tonsillectomy    . Tonsillectomy      Family History  Problem Relation Age of Onset  . Alcohol abuse Father   . Cardiomyopathy Father   . Anemia Father   . Heart disease Father   . Other Father     Irrg. Heart Beat  . Lung cancer Mother   . Diabetes Mother   . Other Brother     Irr. Heart Beat  . Pancreatitis Maternal Grandfather   . Colon cancer Maternal Grandmother     Social History   Social History  . Marital Status: Married    Spouse Name: N/A  . Number of Children: 0  . Years of Education: N/A   Occupational History  . PHARMACIST    Social History Main Topics  . Smoking status: Never Smoker   . Smokeless tobacco: Never Used  . Alcohol Use: No     Comment: 2 per month  . Drug Use: No  . Sexual Activity: Not Asked   Other Topics Concern  . 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    hx fertility treatment    hh of 2   6- 8 hours  Sleep    3 diet coke per day   2 etoh per month      53 month old adopted at 7 weeks  gracie                        Current outpatient prescriptions:  .  dexlansoprazole (DEXILANT) 60 MG capsule, Take 1 capsule (60 mg total) by mouth daily., Disp: 30 capsule, Rfl: 2 .  ibuprofen (ADVIL,MOTRIN) 200 MG tablet, Take 600 mg by mouth every 6 (six) hours as needed for moderate pain. , Disp: , Rfl:  .  SUMAtriptan (IMITREX) 100 MG tablet, TAKE 1 TABLET BY MOUTH IF NEEDED FOR HEADACHE MAY REPEAT IN 2 HOURS IF NEEDED, Disp: 9 tablet, Rfl: 2 .  doxycycline (VIBRA-TABS) 100 MG tablet, Take 1 tablet (100 mg total) by mouth 2 (two) times daily., Disp: 20 tablet, Rfl: 0  EXAM:  Filed Vitals:   05/08/15 1013  BP: 98/64  Pulse: 83  Temp: 97.6 F (36.4 C)    Body mass index is 28.4 kg/(m^2).  GENERAL: vitals reviewed and listed above, alert, oriented, appears well hydrated and in no acute distress  HEENT: atraumatic, conjunttiva clear, no obvious abnormalities on inspection of external nose  and ears, normal appearance of ear canals and TMs, clear nasal congestion, mild post oropharyngeal erythema with PND, no tonsillar edema or exudate, no sinus TTP  NECK: no obvious masses on inspection  LUNGS: clear to auscultation bilaterally, no wheezes, rales or rhonchi, good air movement  CV: HRRR, no peripheral edema  MS: moves all extremities without noticeable abnormality  PSYCH: pleasant and cooperative, no obvious depression or anxiety  ASSESSMENT AND PLAN:  Discussed the following assessment and plan:  Acute maxillary sinusitis, recurrence not specified - Plan: doxycycline (VIBRA-TABS) 100 MG tablet  -Suspect sinusitis. We discussed treatment side effects, likely course, antibiotic misuse, transmission, and signs of developing a serious illness. -of course, we advised to return or notify a doctor immediately if symptoms worsen or persist or new concerns arise.    Patient Instructions  Please start the antibiotic.  INSTRUCTIONS FOR  UPPER RESPIRATORY INFECTION:  -plenty of rest and fluids  -nasal saline wash 2-3 times daily (use prepackaged nasal saline or bottled/distilled water if making your own)   -can use AFRIN nasal spray for drainage and nasal congestion - but do NOT use longer then 3-4 days  -can use tylenol (in no history of liver disease) or ibuprofen (if no history of kidney disease, bowel bleeding or significant heart disease) as directed for aches and sorethroat  -in the winter time, using a humidifier at night is helpful (please follow cleaning instructions)  -if you are taking a cough medication - use only as directed, may also try a teaspoon of honey to coat the throat and throat lozenges. If given a cough medication with codeine or hydrocodone or other narcotic please be advised that this contains a strong and  potentially addicting medication. Please follow instructions carefully, take as little as possible and only use AS NEEDED for severe cough. Discuss potential side effects with your pharmacy. Please do not drive or operate machinery while taking these types of medications. Please do not take other sedating medications, drugs or alcohol while taking this medication without discussing with your doctor.  -for sore throat, salt water gargles can help  -follow up if you have fevers, facial pain, tooth pain, difficulty breathing or are worsening or symptoms persist longer then expected  Upper Respiratory Infection, Adult An upper respiratory infection (URI) is also known as the common cold. It is often caused by a type of germ (virus). Colds are easily spread (contagious). You can pass it to others by kissing, coughing, sneezing, or drinking out of the same glass. Usually, you get better in 1 to 3  weeks.  However, the cough can last for even longer. HOME CARE   Only take medicine as told by your doctor. Follow instructions provided above.  Drink enough water and fluids to keep your pee (urine) clear or  pale yellow.  Get plenty of rest.  Return to work when your temperature is < 100 for 24 hours or as told by your doctor. You may use a face mask and wash your hands to stop your cold from spreading. GET HELP RIGHT AWAY IF:   After the first few days, you feel you are getting worse.  You have questions about your medicine.  You have chills, shortness of breath, or red spit (mucus).  You have pain in the face for more then 1-2 days, especially when you bend forward.  You have a fever, puffy (swollen) neck, pain when you swallow, or white spots in the back of your throat.  You have  a bad headache, ear pain, sinus pain, or chest pain.  You have a high-pitched whistling sound when you breathe in and out (wheezing).  You cough up blood.  You have sore muscles or a stiff neck. MAKE SURE YOU:   Understand these instructions.  Will watch your condition.  Will get help right away if you are not doing well or get worse. Document Released: 10/26/2007 Document Revised: 08/01/2011 Document Reviewed: 08/14/2013 [facility] Patient Information 2015 [place], [place]. This information is not intended to replace advice given to you by your health care provider. Make sure you discuss any questions you have with your health care provider.      Colin Benton R.

## 2015-05-08 NOTE — Progress Notes (Signed)
Pre visit review using our clinic review tool, if applicable. No additional management support is needed unless otherwise documented below in the visit note. 

## 2015-08-25 ENCOUNTER — Other Ambulatory Visit: Payer: Self-pay | Admitting: Family Medicine

## 2015-08-25 MED ORDER — SUMATRIPTAN SUCCINATE 100 MG PO TABS: ORAL_TABLET | ORAL | Status: DC

## 2015-10-09 ENCOUNTER — Encounter: Payer: Self-pay | Admitting: Gastroenterology

## 2016-02-01 ENCOUNTER — Other Ambulatory Visit: Payer: Self-pay | Admitting: Internal Medicine

## 2016-02-03 NOTE — Telephone Encounter (Signed)
Ok to refill x 3   Last rx was 2015?

## 2016-02-25 ENCOUNTER — Other Ambulatory Visit: Payer: Self-pay | Admitting: Internal Medicine

## 2016-02-26 ENCOUNTER — Telehealth: Payer: Self-pay | Admitting: Family Medicine

## 2016-02-26 NOTE — Telephone Encounter (Signed)
Pt due for yearly 30 minute med check.  Please help her to make that appointment.

## 2016-02-26 NOTE — Telephone Encounter (Signed)
Pt has been sch

## 2016-02-26 NOTE — Telephone Encounter (Signed)
Please have her make  appt for yearly med check   Ok to refill  X 1 in the interim .  Last med check w me was  3 16

## 2016-02-26 NOTE — Telephone Encounter (Signed)
#  9 sent to the pharmacy by e-scribe.  Message sent to scheduling.

## 2016-03-15 NOTE — Progress Notes (Signed)
Pre visit review using our clinic review tool, if applicable. No additional management support is needed unless otherwise documented below in the visit note.  Chief Complaint  Patient presents with  . Yearly Medication Check    HPI: Debra [name] 50 y.o.    comes in today for yearly visit medicines preventive. She generally well but does have migraine headaches mostly 2-3 days out of her menstrual cycle otherwise occasional during the month. Takes Imitrex 1 with relief but has to take the next day. Usually starts day or 2 before her period Periods are regular last 3-5 days has an OB/GYN. GI only taking as needed Exelon gets heartburn but otherwise doing well. Was working out and had a back issue at or after chiropractic.  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    ROS: See pertinent positives and negatives per HPI. Somewhat irritable lose his temper at times most recently. No specific depression anxiety.  Past Medical History:  Diagnosis Date  . Abdominal pain, epigastric 01/06/2010  . Chronic headaches   . Esophagitis   . ESOPHAGITIS, HX OF 02/18/2010  . EXTERNAL HEMORRHOIDS 02/18/2010  . FATIGUE 04/13/2007  . Gallstones   . GERD 03/13/2007  . Headache(784.0) 03/13/2007  . HIP PAIN, RIGHT 02/23/2009  . LIVER FUNCTION TESTS, ABNORMAL 01/06/2010  . LOW BACK PAIN, MILD 08/06/2008  . Pancreatitis 12/18/09  . PARESTHESIA 03/13/2007  . Pulmonary nodule    incidental on ct 2007  neg cxray 2011  . Ruptured ovarian cyst   . VARICOSE VEIN 01/06/2010    Family History  Problem Relation Age of Onset  . Alcohol abuse Father   . Cardiomyopathy Father   . Anemia Father   . Heart disease Father   . Other Father     Irrg. Heart Beat  . Lung cancer Mother   . Diabetes Mother   . Other Brother     Irr. Heart Beat  .  Pancreatitis Maternal Grandfather   . Colon cancer Maternal Grandmother     Social History   Social History  . Marital status: Married    Spouse name: N/A  . Number of children: 0  . 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 Smoking status: Never Smoker  . Smokeless tobacco: Never Used  . Alcohol use No     Comment: 2 per month  . Drug use: No  . Sexual activity: Not Asked   Other Topics Concern  . 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    hx fertility treatment    hh of 3   6- 8 hours  Sleep    3 diet coke per day   2 etoh per month    3.5 year okd  adopted at 7 weeks  gracie    Building house                          EXAM:  BP 120/78 (BP Location: Right Arm, Patient Position: Sitting, Cuff Size: Normal)   Temp 98.5 F (36.9 C) (Oral)   Ht 5' 7" (1.702 m)   Wt 188 lb 6.4 oz (  85.5 kg)   BMI 29.51 kg/m   Body mass index is 29.51 kg/m. Physical Exam: Vital signs reviewed AYT:KZSW is a well-developed well-nourished alert cooperative   female who appears her stated age in no acute distress.  HEENT: normocephalic atraumatic , Eyes: PERRL EOM's full, conjunctiva clear, Nares: paten,t no deformity discharge or tenderness., Ears: no deformity EAC's clear TMs with normal landmarks. Mouth: clear OP, no lesions, edema.  Moist mucous membranes. Dentition in adequate repair. NECK: supple without masses, thyromegaly or bruits. No adenopathy CHEST/PULM:  Clear to auscultation and percussion breath sounds equal no wheeze , rales or rhonchi. No chest wall deformities or tenderness. Breast: normal by inspection . No dimpling, discharge, masses, tenderness or discharge .tanner  CV: PMI is nondisplaced, S1 S2 no gallops, murmurs, rubs. Peripheral pulses are full without delay.No JVD . ABDOMEN: Bowel sounds normal nontender  No guard or rebound, no hepato splenomegal  no CVA tenderness.  No hernia. Extremtities:  No clubbing cyanosis or edema, no acute joint swelling or redness no focal atrophy NEURO:  Oriented x3, cranial nerves 3-12 appear to be intact, no obvious focal weakness,gait within normal limits no abnormal reflexes or asymmetrical SKIN: No acute rashes normal turgor, color, no bruising or petechiae. Moles tato no acute lesions PSYCH: Oriented, good eye contact, no obvious depression anxiety, cognition and judgment appear normal. LN: no cervical axillaryadenopathy ASSESSMENT AND PLAN:  Discussed the following assessment and plan:  Visit for preventive health examination - see gyne when due - Plan: Basic metabolic panel, CBC with Differential/Platelet, Hepatic function panel, Lipid panel, TSH  Menstrual migraine without status migrainosus, not intractable - about 3-4 ha per mont 2 at period disc strategies fu if alamr sx  Medication management - request refills when needed yearly check    Expectant management.  -Patient advised to return or notify health care team  if symptoms worsen ,persist or new concerns arise.  Patient Instructions  Work on getting enough sleep. Try taking 800 mg of ibuprofen or 2 Aleve with your Imitrex on the menstrual week. To see if you get a more complete help. Limit or observe the caffeine that can trigger headaches also may be only during her menstrual time. We'll let she know when lab results are back. X50 you should get another colon cancer screening. When he goes through menopause your headaches may change patterns depending on your hormone. Fortunately most women's migraines get better after menopause.  Health Maintenance, Female Adopting a healthy lifestyle and getting preventive care can go a long way to promote health and wellness. Talk with your health care provider about what schedule of regular examinations is right for you. This is a good chance for you to check in with your provider about disease  prevention and staying healthy. In between checkups, there are plenty of things you can do on your own. Experts have done a lot of research about which lifestyle changes and preventive measures are most likely to keep you healthy. Ask your health care provider for more information. WEIGHT AND DIET  Eat a healthy diet  Be sure to include plenty of vegetables, fruits, low-fat dairy products, and lean protein.  Do not eat a lot of foods high in solid fats, added sugars, or salt.  Get regular exercise. This is one of the most important things you can do for your health.  Most adults should exercise for at least 150 minutes each week. The exercise should increase your heart rate and make you sweat (moderate-intensity  exercise).  Most adults should also do strengthening exercises at least twice a week. This is in addition to the moderate-intensity exercise.  Maintain a healthy weight  Body mass index (BMI) is a measurement that can be used to identify possible weight problems. It estimates body fat based on height and weight. Your health care provider can help determine your BMI and help you achieve or maintain a healthy weight.  For females 75 years of age and older:   A BMI below 18.5 is considered underweight.  A BMI of 18.5 to 24.9 is normal.  A BMI of 25 to 29.9 is considered overweight.  A BMI of 30 and above is considered obese.  Watch levels of cholesterol and blood lipids  You should start having your blood tested for lipids and cholesterol at 49 years of age, then have this test every 5 years.  You may need to have your cholesterol levels checked more often if:  Your lipid or cholesterol levels are high.  You are older than 49 years of age.  You are at high risk for heart disease.  CANCER SCREENING   Lung Cancer  Lung cancer screening is recommended for adults 30-60 years old who are at high risk for lung cancer because of a history of smoking.  A yearly low-dose  CT scan of the lungs is recommended for people who:  Currently smoke.  Have quit within the past 15 years.  Have at least a 30-pack-year history of smoking. A pack year is smoking an average of one pack of cigarettes a day for 1 year.  Yearly screening should continue until it has been 15 years since you quit.  Yearly screening should stop if you develop a health problem that would prevent you from having lung cancer treatment.  Breast Cancer  Practice breast self-awareness. This means understanding how your breasts normally appear and feel.  It also means doing regular breast self-exams. Let your health care provider know about any changes, no matter how small.  If you are in your 20s or 30s, you should have a clinical breast exam (CBE) by a health care provider every 1-3 years as part of a regular health exam.  If you are 33 or older, have a CBE every year. Also consider having a breast X-ray (mammogram) every year.  If you have a family history of breast cancer, talk to your health care provider about genetic screening.  If you are at high risk for breast cancer, talk to your health care provider about having an MRI and a mammogram every year.  Breast cancer gene (BRCA) assessment is recommended for women who have family members with BRCA-related cancers. BRCA-related cancers include:  Breast.  Ovarian.  Tubal.  Peritoneal cancers.  Results of the assessment will determine the need for genetic counseling and BRCA1 and BRCA2 testing. Cervical Cancer Your health care provider may recommend that you be screened regularly for cancer of the pelvic organs (ovaries, uterus, and vagina). This screening involves a pelvic examination, including checking for microscopic changes to the surface of your cervix (Pap test). You may be encouraged to have this screening done every 3 years, beginning at age 71.  For women ages 64-65, health care providers may recommend pelvic exams and Pap  testing every 3 years, or they may recommend the Pap and pelvic exam, combined with testing for human papilloma virus (HPV), every 5 years. Some types of HPV increase your risk of cervical cancer. Testing for HPV may also  be done on women of any age with unclear Pap test results.  Other health care providers may not recommend any screening for nonpregnant women who are considered low risk for pelvic cancer and who do not have symptoms. Ask your health care provider if a screening pelvic exam is right for you.  If you have had past treatment for cervical cancer or a condition that could lead to cancer, you need Pap tests and screening for cancer for at least 20 years after your treatment. If Pap tests have been discontinued, your risk factors (such as having a new sexual partner) need to be reassessed to determine if screening should resume. Some women have medical problems that increase the chance of getting cervical cancer. In these cases, your health care provider may recommend more frequent screening and Pap tests. Colorectal Cancer  This type of cancer can be detected and often prevented.  Routine colorectal cancer screening usually begins at 49 years of age and continues through 49 years of age.  Your health care provider may recommend screening at an earlier age if you have risk factors for colon cancer.  Your health care provider may also recommend using home test kits to check for hidden blood in the stool.  A small camera at the end of a tube can be used to examine your colon directly (sigmoidoscopy or colonoscopy). This is done to check for the earliest forms of colorectal cancer.  Routine screening usually begins at age 52.  Direct examination of the colon should be repeated every 5-10 years through 49 years of age. However, you may need to be screened more often if early forms of precancerous polyps or small growths are found. Skin Cancer  Check your skin from head to toe  regularly.  Tell your health care provider about any new moles or changes in moles, especially if there is a change in a mole's shape or color.  Also tell your health care provider if you have a mole that is larger than the size of a pencil eraser.  Always use sunscreen. Apply sunscreen liberally and repeatedly throughout the day.  Protect yourself by wearing long sleeves, pants, a wide-brimmed hat, and sunglasses whenever you are outside. HEART DISEASE, DIABETES, AND HIGH BLOOD PRESSURE   High blood pressure causes heart disease and increases the risk of stroke. High blood pressure is more likely to develop in:  People who have blood pressure in the high end of the normal range (130-139/85-89 mm Hg).  People who are overweight or obese.  People who are African American.  If you are 68-55 years of age, have your blood pressure checked every 3-5 years. If you are 33 years of age or older, have your blood pressure checked every year. You should have your blood pressure measured twice--once when you are at a hospital or clinic, and once when you are not at a hospital or clinic. Record the average of the two measurements. To check your blood pressure when you are not at a hospital or clinic, you can use:  An automated blood pressure machine at a pharmacy.  A home blood pressure monitor.  If you are between 66 years and 32 years old, ask your health care provider if you should take aspirin to prevent strokes.  Have regular diabetes screenings. This involves taking a blood sample to check your fasting blood sugar level.  If you are at a normal weight and have a low risk for diabetes, have this test once every  three years after 49 years of age.  If you are overweight and have a high risk for diabetes, consider being tested at a younger age or more often. PREVENTING INFECTION  Hepatitis B  If you have a higher risk for hepatitis B, you should be screened for this virus. You are considered  at high risk for hepatitis B if:  You were born in a country where hepatitis B is common. Ask your health care provider which countries are considered high risk.  Your parents were born in a high-risk country, and you have not been immunized against hepatitis B (hepatitis B vaccine).  You have HIV or AIDS.  You use needles to inject street drugs.  You live with someone who has hepatitis B.  You have had sex with someone who has hepatitis B.  You get hemodialysis treatment.  You take certain medicines for conditions, including cancer, organ transplantation, and autoimmune conditions. Hepatitis C  Blood testing is recommended for:  Everyone born from [birth year] through 1964.  Anyone with known risk factors for hepatitis C. Sexually transmitted infections (STIs)  You should be screened for sexually transmitted infections (STIs) including gonorrhea and chlamydia if:  You are sexually active and are younger than 49 years of age.  You are older than 49 years of age and your health care provider tells you that you are at risk for this type of infection.  Your sexual activity has changed since you were last screened and you are at an increased risk for chlamydia or gonorrhea. Ask your health care provider if you are at risk.  If you do not have HIV, but are at risk, it may be recommended that you take a prescription medicine daily to prevent HIV infection. This is called pre-exposure prophylaxis (PrEP). You are considered at risk if:  You are sexually active and do not regularly use condoms or know the HIV status of your partner(s).  You take drugs by injection.  You are sexually active with a partner who has HIV. Talk with your health care provider about whether you are at high risk of being infected with HIV. If you choose to begin PrEP, you should first be tested for HIV. You should then be tested every 3 months for as long as you are taking PrEP.  PREGNANCY   If you are  premenopausal and you may become pregnant, ask your health care provider about preconception counseling.  If you may become pregnant, take 400 to 800 micrograms (mcg) of folic acid every day.  If you want to prevent pregnancy, talk to your health care provider about birth control (contraception). OSTEOPOROSIS AND MENOPAUSE   Osteoporosis is a disease in which the bones lose minerals and strength with aging. This can result in serious bone fractures. Your risk for osteoporosis can be identified using a bone density scan.  If you are 66 years of age or older, or if you are at risk for osteoporosis and fractures, ask your health care provider if you should be screened.  Ask your health care provider whether you should take a calcium or vitamin D supplement to lower your risk for osteoporosis.  Menopause may have certain physical symptoms and risks.  Hormone replacement therapy may reduce some of these symptoms and risks. Talk to your health care provider about whether hormone replacement therapy is right for you.  HOME CARE INSTRUCTIONS   Schedule regular health, dental, and eye exams.  Stay current with your immunizations.   Do not use  any tobacco products including cigarettes, chewing tobacco, or electronic cigarettes.  If you are pregnant, do not drink alcohol.  If you are breastfeeding, limit how much and how often you drink alcohol.  Limit alcohol intake to no more than 1 drink per day for nonpregnant women. One drink equals 12 ounces of beer, 5 ounces of wine, or 1 ounces of hard liquor.  Do not use street drugs.  Do not share needles.  Ask your health care provider for help if you need support or information about quitting drugs.  Tell your health care provider if you often feel depressed.  Tell your health care provider if you have ever been abused or do not feel safe at home.   This information is not intended to replace advice given to you by your health care  provider. Make sure you discuss any questions you have with your health care provider.   Document Released: 11/22/2010 Document Revised: 05/30/2014 Document Reviewed: 04/10/2013 Elsevier Interactive Patient Education 2016 [place] K. Panosh M.D.    Medication List       Accurate as of 03/16/16  1:27 PM. Always use your most recent med list.          DEXILANT 60 MG capsule Generic drug:  dexlansoprazole TAKE 1 CAPSULE BY MOUTH DAILY   ibuprofen 200 MG tablet Commonly known as:  ADVIL,MOTRIN Take 600 mg by mouth every 6 (six) hours as needed for moderate pain.   MULTI-VITAMIN PO Take 1 tablet by mouth daily.   SUMAtriptan 100 MG tablet Commonly known as:  IMITREX TAKE 1 TABLET BY MOUTH IF NEEDED FOR HEADACHE MAY REPEAT IN 2 HOURS IF NEEDED

## 2016-03-16 ENCOUNTER — Encounter: Payer: Self-pay | Admitting: Internal Medicine

## 2016-03-16 ENCOUNTER — Ambulatory Visit (INDEPENDENT_AMBULATORY_CARE_PROVIDER_SITE_OTHER): Payer: 59 | Admitting: Internal Medicine

## 2016-03-16 VITALS — BP 120/78 | Temp 98.5°F | Ht 67.0 in | Wt 188.4 lb

## 2016-03-16 DIAGNOSIS — Z79899 Other long term (current) drug therapy: Secondary | ICD-10-CM

## 2016-03-16 DIAGNOSIS — Z0000 Encounter for general adult medical examination without abnormal findings: Secondary | ICD-10-CM | POA: Diagnosis not present

## 2016-03-16 DIAGNOSIS — G43829 Menstrual migraine, not intractable, without status migrainosus: Secondary | ICD-10-CM

## 2016-03-16 LAB — BASIC METABOLIC PANEL
BUN: 16 mg/dL (ref 6–23)
CO2: 26 mEq/L (ref 19–32)
Calcium: 9.4 mg/dL (ref 8.4–10.5)
Chloride: 104 mEq/L (ref 96–112)
Creatinine, Ser: 0.78 mg/dL (ref 0.40–1.20)
GFR: 83.32 mL/min (ref >60.00)
Glucose, Bld: 84 mg/dL (ref 70–99)
Potassium: 4.3 mEq/L (ref 3.5–5.1)
Sodium: 138 mEq/L (ref 135–145)

## 2016-03-16 LAB — HEPATIC FUNCTION PANEL
ALT: 12 U/L (ref 0–35)
AST: 17 U/L (ref 0–37)
Albumin: 4.2 g/dL (ref 3.5–5.2)
Alkaline Phosphatase: 45 U/L (ref 39–117)
Bilirubin, Direct: 0.1 mg/dL (ref 0.0–0.3)
Total Bilirubin: 0.6 mg/dL (ref 0.2–1.2)
Total Protein: 7.2 g/dL (ref 6.0–8.3)

## 2016-03-16 LAB — LIPID PANEL
Cholesterol: 179 mg/dL (ref 0–200)
HDL: 60.2 mg/dL (ref >39.00)
LDL Cholesterol: 104 mg/dL — ABNORMAL HIGH (ref 0–99)
NonHDL: 118.8
Total CHOL/HDL Ratio: 3
Triglycerides: 74 mg/dL (ref 0.0–149.0)
VLDL: 14.8 mg/dL (ref 0.0–40.0)

## 2016-03-16 LAB — TSH: TSH: 1.39 u[IU]/mL (ref 0.35–4.50)

## 2016-03-16 LAB — CBC WITH DIFFERENTIAL/PLATELET
Basophils Absolute: 0 10*3/uL (ref 0.0–0.1)
Basophils Relative: 0.6 % (ref 0.0–3.0)
Eosinophils Absolute: 0.2 10*3/uL (ref 0.0–0.7)
Eosinophils Relative: 2.6 % (ref 0.0–5.0)
HCT: 39.7 % (ref 36.0–46.0)
Hemoglobin: 13.5 g/dL (ref 12.0–15.0)
Lymphocytes Relative: 27.4 % (ref 12.0–46.0)
Lymphs Abs: 2 10*3/uL (ref 0.7–4.0)
MCHC: 33.9 g/dL (ref 30.0–36.0)
MCV: 92.5 fl (ref 78.0–100.0)
Monocytes Absolute: 0.5 10*3/uL (ref 0.1–1.0)
Monocytes Relative: 6.7 % (ref 3.0–12.0)
Neutro Abs: 4.6 10*3/uL (ref 1.4–7.7)
Neutrophils Relative %: 62.7 % (ref 43.0–77.0)
Platelets: 266 10*3/uL (ref 150.0–400.0)
RBC: 4.29 Mil/uL (ref 3.87–5.11)
RDW: 13.2 % (ref 11.5–15.5)
WBC: 7.3 10*3/uL (ref 4.0–10.5)

## 2016-03-16 NOTE — Patient Instructions (Addendum)
Work on getting enough sleep. Try taking 800 mg of ibuprofen or 2 Aleve with your Imitrex on the menstrual week. To see if you get a more complete help. Limit or observe the caffeine that can trigger headaches also may be only during her menstrual time. We'll let she know when lab results are back. X50 you should get another colon cancer screening. When he goes through menopause your headaches may change patterns depending on your hormone. Fortunately most women's migraines get better after menopause.  Health Maintenance, Female Adopting a healthy lifestyle and getting preventive care can go a long way to promote health and wellness. Talk with your health care provider about what schedule of regular examinations is right for you. This is a good chance for you to check in with your provider about disease prevention and staying healthy. In between checkups, there are plenty of things you can do on your own. Experts have done a lot of research about which lifestyle changes and preventive measures are most likely to keep you healthy. Ask your health care provider for more information. WEIGHT AND DIET  Eat a healthy diet  Be sure to include plenty of vegetables, fruits, low-fat dairy products, and lean protein.  Do not eat a lot of foods high in solid fats, added sugars, or salt.  Get regular exercise. This is one of the most important things you can do for your health.  Most adults should exercise for at least 150 minutes each week. The exercise should increase your heart rate and make you sweat (moderate-intensity exercise).  Most adults should also do strengthening exercises at least twice a week. This is in addition to the moderate-intensity exercise.  Maintain a healthy weight  Body mass index (BMI) is a measurement that can be used to identify possible weight problems. It estimates body fat based on height and weight. Your health care provider can help determine your BMI and help you  achieve or maintain a healthy weight.  For females 49 years of age and older:   A BMI below 18.5 is considered underweight.  A BMI of 18.5 to 24.9 is normal.  A BMI of 25 to 29.9 is considered overweight.  A BMI of 30 and above is considered obese.  Watch levels of cholesterol and blood lipids  You should start having your blood tested for lipids and cholesterol at 49 years of age, then have this test every 5 years.  You may need to have your cholesterol levels checked more often if:  Your lipid or cholesterol levels are high.  You are older than 49 years of age.  You are at high risk for heart disease.  CANCER SCREENING   Lung Cancer  Lung cancer screening is recommended for adults 49-40 years old who are at high risk for lung cancer because of a history of smoking.  A yearly low-dose CT scan of the lungs is recommended for people who:  Currently smoke.  Have quit within the past 15 years.  Have at least a 30-pack-year history of smoking. A pack year is smoking an average of one pack of cigarettes a day for 1 year.  Yearly screening should continue until it has been 15 years since you quit.  Yearly screening should stop if you develop a health problem that would prevent you from having lung cancer treatment.  Breast Cancer  Practice breast self-awareness. This means understanding how your breasts normally appear and feel.  It also means doing regular breast self-exams. Let  your health care provider know about any changes, no matter how small.  If you are in your 49s or 30s, you should have a clinical breast exam (CBE) by a health care provider every 1-3 years as part of a regular health exam.  If you are 49 or older, have a CBE every year. Also consider having a breast X-ray (mammogram) every year.  If you have a family history of breast cancer, talk to your health care provider about genetic screening.  If you are at high risk for breast cancer, talk to your  health care provider about having an MRI and a mammogram every year.  Breast cancer gene (BRCA) assessment is recommended for women who have family members with BRCA-related cancers. BRCA-related cancers include:  Breast.  Ovarian.  Tubal.  Peritoneal cancers.  Results of the assessment will determine the need for genetic counseling and BRCA1 and BRCA2 testing. Cervical Cancer Your health care provider may recommend that you be screened regularly for cancer of the pelvic organs (ovaries, uterus, and vagina). This screening involves a pelvic examination, including checking for microscopic changes to the surface of your cervix (Pap test). You may be encouraged to have this screening done every 3 years, beginning at age 58.  For women ages 35-65, health care providers may recommend pelvic exams and Pap testing every 3 years, or they may recommend the Pap and pelvic exam, combined with testing for human papilloma virus (HPV), every 5 years. Some types of HPV increase your risk of cervical cancer. Testing for HPV may also be done on women of any age with unclear Pap test results.  Other health care providers may not recommend any screening for nonpregnant women who are considered low risk for pelvic cancer and who do not have symptoms. Ask your health care provider if a screening pelvic exam is right for you.  If you have had past treatment for cervical cancer or a condition that could lead to cancer, you need Pap tests and screening for cancer for at least 20 years after your treatment. If Pap tests have been discontinued, your risk factors (such as having a new sexual partner) need to be reassessed to determine if screening should resume. Some women have medical problems that increase the chance of getting cervical cancer. In these cases, your health care provider may recommend more frequent screening and Pap tests. Colorectal Cancer  This type of cancer can be detected and often  prevented.  Routine colorectal cancer screening usually begins at 49 years of age and continues through 49 years of age.  Your health care provider may recommend screening at an earlier age if you have risk factors for colon cancer.  Your health care provider may also recommend using home test kits to check for hidden blood in the stool.  A small camera at the end of a tube can be used to examine your colon directly (sigmoidoscopy or colonoscopy). This is done to check for the earliest forms of colorectal cancer.  Routine screening usually begins at age 52.  Direct examination of the colon should be repeated every 5-10 years through 49 years of age. However, you may need to be screened more often if early forms of precancerous polyps or small growths are found. Skin Cancer  Check your skin from head to toe regularly.  Tell your health care provider about any new moles or changes in moles, especially if there is a change in a mole's shape or color.  Also tell your  care provider if you have a mole that is larger than the size of a pencil eraser.  Always use sunscreen. Apply sunscreen liberally and repeatedly throughout the day.  Protect yourself by wearing long sleeves, pants, a wide-brimmed hat, and sunglasses whenever you are outside. HEART DISEASE, DIABETES, AND HIGH BLOOD PRESSURE   High blood pressure causes heart disease and increases the risk of stroke. High blood pressure is more likely to develop in:  People who have blood pressure in the high end of the normal range (130-139/85-89 mm Hg).  People who are overweight or obese.  People who are African American.  If you are 18-39 years of age, have your blood pressure checked every 3-5 years. If you are 40 years of age or older, have your blood pressure checked every year. You should have your blood pressure measured twice--once when you are at a hospital or clinic, and once when you are not at a hospital or clinic.  Record the average of the two measurements. To check your blood pressure when you are not at a hospital or clinic, you can use:  An automated blood pressure machine at a pharmacy.  A home blood pressure monitor.  If you are between 55 years and 79 years old, ask your health care provider if you should take aspirin to prevent strokes.  Have regular diabetes screenings. This involves taking a blood sample to check your fasting blood sugar level.  If you are at a normal weight and have a low risk for diabetes, have this test once every three years after 49 years of age.  If you are overweight and have a high risk for diabetes, consider being tested at a younger age or more often. PREVENTING INFECTION  Hepatitis B  If you have a higher risk for hepatitis B, you should be screened for this virus. You are considered at high risk for hepatitis B if:  You were born in a country where hepatitis B is common. Ask your health care provider which countries are considered high risk.  Your parents were born in a high-risk country, and you have not been immunized against hepatitis B (hepatitis B vaccine).  You have HIV or AIDS.  You use needles to inject street drugs.  You live with someone who has hepatitis B.  You have had sex with someone who has hepatitis B.  You get hemodialysis treatment.  You take certain medicines for conditions, including cancer, organ transplantation, and autoimmune conditions. Hepatitis C  Blood testing is recommended for:  Everyone born from 1945 through 1965.  Anyone with known risk factors for hepatitis C. Sexually transmitted infections (STIs)  You should be screened for sexually transmitted infections (STIs) including gonorrhea and chlamydia if:  You are sexually active and are younger than 49 years of age.  You are older than 49 years of age and your health care provider tells you that you are at risk for this type of infection.  Your sexual activity  has changed since you were last screened and you are at an increased risk for chlamydia or gonorrhea. Ask your health care provider if you are at risk.  If you do not have HIV, but are at risk, it may be recommended that you take a prescription medicine daily to prevent HIV infection. This is called pre-exposure prophylaxis (PrEP). You are considered at risk if:  You are sexually active and do not regularly use condoms or know the HIV status of your partner(s).  You take   take drugs by injection.  You are sexually active with a partner who has HIV. Talk with your health care provider about whether you are at high risk of being infected with HIV. If you choose to begin PrEP, you should first be tested for HIV. You should then be tested every 3 months for as long as you are taking PrEP.  PREGNANCY   If you are premenopausal and you may become pregnant, ask your health care provider about preconception counseling.  If you may become pregnant, take 400 to 800 micrograms (mcg) of folic acid every day.  If you want to prevent pregnancy, talk to your health care provider about birth control (contraception). OSTEOPOROSIS AND MENOPAUSE   Osteoporosis is a disease in which the bones lose minerals and strength with aging. This can result in serious bone fractures. Your risk for osteoporosis can be identified using a bone density scan.  If you are 64 years of age or older, or if you are at risk for osteoporosis and fractures, ask your health care provider if you should be screened.  Ask your health care provider whether you should take a calcium or vitamin D supplement to lower your risk for osteoporosis.  Menopause may have certain physical symptoms and risks.  Hormone replacement therapy may reduce some of these symptoms and risks. Talk to your health care provider about whether hormone replacement therapy is right for you.  HOME CARE INSTRUCTIONS   Schedule regular health, dental, and eye  exams.  Stay current with your immunizations.   Do not use any tobacco products including cigarettes, chewing tobacco, or electronic cigarettes.  If you are pregnant, do not drink alcohol.  If you are breastfeeding, limit how much and how often you drink alcohol.  Limit alcohol intake to no more than 1 drink per day for nonpregnant women. One drink equals 12 ounces of beer, 5 ounces of wine, or 1 ounces of hard liquor.  Do not use street drugs.  Do not share needles.  Ask your health care provider for help if you need support or information about quitting drugs.  Tell your health care provider if you often feel depressed.  Tell your health care provider if you have ever been abused or do not feel safe at home.   This information is not intended to replace advice given to you by your health care provider. Make sure you discuss any questions you have with your health care provider.   Document Released: 11/22/2010 Document Revised: 05/30/2014 Document Reviewed: 04/10/2013 Elsevier Interactive Patient Education [employer].

## 2016-06-16 ENCOUNTER — Other Ambulatory Visit: Payer: Self-pay | Admitting: Internal Medicine

## 2016-06-17 NOTE — Telephone Encounter (Signed)
Okay to refill? 

## 2016-08-15 ENCOUNTER — Other Ambulatory Visit: Payer: Self-pay | Admitting: Internal Medicine

## 2016-08-26 ENCOUNTER — Other Ambulatory Visit: Payer: Self-pay | Admitting: Emergency Medicine

## 2016-08-26 ENCOUNTER — Telehealth: Payer: Self-pay | Admitting: Internal Medicine

## 2016-08-26 DIAGNOSIS — I83819 Varicose veins of unspecified lower extremities with pain: Secondary | ICD-10-CM

## 2016-08-26 DIAGNOSIS — M79606 Pain in leg, unspecified: Secondary | ICD-10-CM

## 2016-08-26 NOTE — Telephone Encounter (Signed)
Please advise 

## 2016-08-26 NOTE — Telephone Encounter (Signed)
Referral put in.

## 2016-08-26 NOTE — Telephone Encounter (Signed)
Ok to refer to vascular     Dx leg pain varicose veins

## 2016-08-26 NOTE — Telephone Encounter (Signed)
Pt would like to know if Dr Regis Bill will refer her to a vascular/vein . Pt having swelling in the ankles after exercise.  Also pt states her varicose veins are worse as well. But the ankle swelling is the most concerning.  Pt prefers not to have to come in for an appt if Dr Regis Bill can refer, due to insurance cost.

## 2016-08-27 ENCOUNTER — Encounter: Payer: Self-pay | Admitting: Internal Medicine

## 2016-09-02 ENCOUNTER — Other Ambulatory Visit: Payer: Self-pay

## 2016-09-02 DIAGNOSIS — I83813 Varicose veins of bilateral lower extremities with pain: Secondary | ICD-10-CM

## 2016-10-03 ENCOUNTER — Other Ambulatory Visit: Payer: Self-pay | Admitting: Internal Medicine

## 2016-10-20 ENCOUNTER — Encounter (HOSPITAL_COMMUNITY): Payer: 59

## 2016-10-20 ENCOUNTER — Encounter: Payer: 59 | Admitting: Vascular Surgery

## 2016-10-24 ENCOUNTER — Encounter: Payer: Self-pay | Admitting: Family

## 2016-10-26 ENCOUNTER — Encounter: Payer: Self-pay | Admitting: Vascular Surgery

## 2016-10-26 ENCOUNTER — Ambulatory Visit (INDEPENDENT_AMBULATORY_CARE_PROVIDER_SITE_OTHER): Payer: 59 | Admitting: Vascular Surgery

## 2016-10-26 ENCOUNTER — Ambulatory Visit (HOSPITAL_COMMUNITY)
Admission: RE | Admit: 2016-10-26 | Discharge: 2016-10-26 | Disposition: A | Discharge status: Home / Self Care | Payer: 59 | Source: Ambulatory Visit | Attending: Vascular Surgery | Admitting: Vascular Surgery

## 2016-10-26 VITALS — BP 112/67 | HR 80 | Temp 97.5°F | Resp 16 | Ht 67.5 in | Wt 189.0 lb

## 2016-10-26 DIAGNOSIS — I83813 Varicose veins of bilateral lower extremities with pain: Secondary | ICD-10-CM

## 2016-10-26 NOTE — Progress Notes (Signed)
Referring Physician: Shanon Ace, MD  Patient name: Debra [name] MRN: [id number] DOB: [birth year] Sex: female  REASON FOR CONSULT: Varicose veins with swelling pain and itching  HPI: Debra [name] is a 51 y.o. female with several year history of varicose veins that have slowly progressively become worse. She states she has varicose veins that have been present for several years. She gets swelling in her ankles at the end of the day. She also gets some skin itching. She also has some fullness heaviness and aching that occurs in both legs. The left leg is worse. All of her symptoms are improved after a night's rest. She does not really take nonsteroidals or elevate her legs. 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 She does have some lightweight compression stockings that she wears when she is at work. She states she gets some relief with these. These seem to be fairly light compression by her description.  She has no family history of DVT or personal history of DVT. She states that her grandmother did have a history of varicose veins. Other medical problems include migraine headaches which have been stable.  Past Medical History:  Diagnosis Date  . Abdominal pain, epigastric 01/06/2010  . Chronic headaches   . Esophagitis   . ESOPHAGITIS, HX OF 02/18/2010  . EXTERNAL HEMORRHOIDS 02/18/2010  . FATIGUE 04/13/2007  . Gallstones   . GERD 03/13/2007  . Headache(784.0) 03/13/2007  . HIP PAIN, RIGHT 02/23/2009  . LIVER FUNCTION TESTS, ABNORMAL 01/06/2010  . LOW BACK PAIN, MILD 08/06/2008  . Pancreatitis 12/18/09  . PARESTHESIA 03/13/2007  . Pulmonary nodule    incidental on ct 2007  neg cxray 2011  . Ruptured ovarian cyst   . VARICOSE VEIN 01/06/2010   Past Surgical History:  Procedure Laterality Date  . CHOLECYSTECTOMY    . TONSILLECTOMY    . TONSILLECTOMY      Family History  Problem Relation Age of Onset  . Alcohol abuse Father   .  Cardiomyopathy Father   . Anemia Father   . Heart disease Father   . Other Father        Irrg. Heart Beat  . Lung cancer Mother   . Diabetes Mother   . Other Brother        Irr. Heart Beat  . Pancreatitis Maternal Grandfather   . Colon cancer Maternal Grandmother     SOCIAL HISTORY: Social History   Social History  . Marital status: Married    Spouse name: N/A  . Number of children: 0  . 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 Smoking status: Never Smoker  . Smokeless tobacco: Never Used  . Alcohol use No     Comment: 2 per month  . Drug use: No  . Sexual activity: Not on file   Other Topics Concern  . Not on file   Social History Narrative   Dogs 2     married is a Software engineer never smoked.    hx fertility treatment    hh of 3   6- 8 hours  Sleep    3 diet coke per day   2 etoh per month    3.5 year okd  adopted at 7 weeks  gracie    Building house  Allergies  Allergen Reactions  . Gadolinium      Code: HIVES, Desc: Multihance--pt had hives, Onset Date: [id number] IVP Dye  . Penicillins     REACTION: rash  . Sulfonamide Derivatives     REACTION: rash    Current Outpatient Prescriptions  Medication Sig Dispense Refill  . DEXILANT 60 MG capsule TAKE 1 CAPSULE BY MOUTH DAILY 30 capsule 2  . ibuprofen (ADVIL,MOTRIN) 200 MG tablet Take 600 mg by mouth every 6 (six) hours as needed for moderate pain.     . Multiple Vitamin (MULTI-VITAMIN PO) Take 1 tablet by mouth daily.    . SUMAtriptan (IMITREX) 100 MG tablet TAKE 1 TABLET BY MOUTH IMMEDIATELY MAY REPEAT AFTER 2 HOURS 9 tablet 0   No current facility-administered medications for this visit.     ROS:   General:  No weight loss, Fever, chills  HEENT: No recent headaches, no nasal bleeding, no visual changes, no sore throat  Neurologic: No dizziness, blackouts, seizures. No recent symptoms of stroke or mini-  stroke. No recent episodes of slurred speech, or temporary blindness.  Cardiac: No recent episodes of chest pain/pressure, no shortness of breath at rest.  No shortness of breath with exertion.  Denies history of atrial fibrillation or irregular heartbeat  Vascular: No history of rest pain in feet.  No history of claudication.  No history of non-healing ulcer, No history of DVT   Pulmonary: No home oxygen, no productive cough, no hemoptysis,  No asthma or wheezing  Musculoskeletal:  [ ]  Arthritis, [ ]  Low back pain,  [ ]  Joint pain  Hematologic:No history of hypercoagulable state.  No history of easy bleeding.  No history of anemia  Gastrointestinal: No hematochezia or melena,  No gastroesophageal reflux, no trouble swallowing  Urinary: [ ]  chronic Kidney disease, [ ]  on HD - [ ]  MWF or [ ]  TTHS, [ ]  Burning with urination, [ ]  Frequent urination, [ ]  Difficulty urinating;   Skin: No rashes  Psychological: No history of anxiety,  No history of depression   Physical Examination  Vitals:   10/26/16 1455  BP: 112/67  Pulse: 80  Resp: 16  Temp: 97.5 F (36.4 C)  SpO2: 99%  Weight: 189 lb (85.7 kg)  Height: 5' 7.5" (1.715 m)    Body mass index is 29.16 kg/m.  General:  Alert and oriented, no acute distress HEENT: Normal Neck: No bruit or JVD Pulmonary: Clear to auscultation bilaterally Cardiac: Regular Rate and Rhythm without murmur Abdomen: Soft, non-tender, non-distended, no mass Skin: No rash, large cluster of varicosities left medial calf 4-6 mm diameter vessels covering a surface area of about 10 cm. These are on the left medial calf. Extremity Pulses:  2+ radial, brachial, femoral, dorsalis pedis, posterior tibial pulses bilaterally Musculoskeletal: No deformity,  trace pretibial and ankle edema bilaterally Neurologic: Upper and lower extremity motor 5/5 and symmetric  DATA:  Patient had a venous reflux exam today. This showed some mild reflux in the common femoral  vein bilaterally. She had diffuse reflux in the greater saphenous vein bilaterally. Left vein was 6-8 mm in diameter. Right was 4-6 mm in diameter.  ASSESSMENT:  CEAP class III. She has symptomatic varicose veins with pain. I discussed the patient today the pathophysiology of varicose veins and venous reflux.   PLAN:  The patient was offered possible laser ablation of her left lower extremity varicose veins for symptomatic relief today. She was also given a prescription for 20-30 mm compression stockings  and measured for these today. She currently wishes to 4 compression alone and will call us back if she wishes consider laser ablation at some point in the future.   Ruta Hinds, MD [facility] Office: [phone] Pager: [phone]

## 2016-11-05 ENCOUNTER — Encounter (HOSPITAL_COMMUNITY): Payer: Self-pay

## 2016-11-05 ENCOUNTER — Ambulatory Visit (HOSPITAL_COMMUNITY)
Admission: EM | Admit: 2016-11-05 | Discharge: 2016-11-05 | Disposition: A | Discharge status: Home / Self Care | Payer: 59 | Attending: Family Medicine | Admitting: Family Medicine

## 2016-11-05 DIAGNOSIS — B9789 Other viral agents as the cause of diseases classified elsewhere: Secondary | ICD-10-CM

## 2016-11-05 DIAGNOSIS — J069 Acute upper respiratory infection, unspecified: Secondary | ICD-10-CM | POA: Diagnosis not present

## 2016-11-05 DIAGNOSIS — R05 Cough: Secondary | ICD-10-CM

## 2016-11-05 MED ORDER — PROMETHAZINE-CODEINE 6.25-10 MG/5ML PO SYRP: 5.0000 mL | ORAL_SOLUTION | Freq: Four times a day (QID) | ORAL | 0 refills | Status: DC | PRN

## 2016-11-05 MED ORDER — ALBUTEROL SULFATE HFA 108 (90 BASE) MCG/ACT IN AERS: 2.0000 | INHALATION_SPRAY | RESPIRATORY_TRACT | 0 refills | Status: DC | PRN

## 2016-11-05 NOTE — ED Triage Notes (Signed)
Pt states she had a cold a couple weeks ago that lasted 2 weeks had productive cough, nasal congestion, and cough. Sxs went away for about a week and last night started back with a bad cough. States she can taste the mucus but not much is coming up. Has tried OTC mucinex cough and cold liquid and niquil.

## 2016-11-07 NOTE — ED Provider Notes (Signed)
[id number] 11/05/16 Arrival Time 1715  ASSESSMENT & PLAN:  Viral URI with cough  Cough bothering her the most. With some wheezing she may try albuterol inhaler prn. Symptom care. F/U as needed.   Reviewed expectations re: course of current medical issues.  Discussed self-management of symptoms.  Outlined signs and symptoms indicating need for more acute intervention. Follow up here or Emergency Department if worsening.  Patient verbalized understanding. Questions answered.  After Visit Summary given.  Meds ordered this encounter  Medications  . promethazine-codeine (PHENERGAN WITH CODEINE) 6.25-10 MG/5ML syrup    Sig: Take 5 mLs by mouth every 6 (six) hours as needed for cough.    Dispense:  120 mL    Refill:  0  . albuterol (PROVENTIL HFA;VENTOLIN HFA) 108 (90 Base) MCG/ACT inhaler    Sig: Inhale 2 puffs into the lungs every 4 (four) hours as needed for wheezing.    Dispense:  1 Inhaler    Refill:  0     SUBJECTIVE:  Debra [name] is a 51 y.o. female who presents with complaint of 1-2 days acute onset URI symptoms. Coughing, dry. Worse at night. No SOB or wheezing reported. OTC without relief. Afebrile. No n/v. Mild nasal congestion. No CP.  ROS: Review of Systems  Constitutional: Negative for chills.  Otherwise as per HPI.  OBJECTIVE:  Vitals:   11/05/16 1803  BP: 123/64  Pulse: 83  Temp: 98.5 F (36.9 C)  TempSrc: Oral  SpO2: 100%     General appearance: alert, cooperative, appears stated age and no distress Eyes: conjunctivae/corneas clear. PERRL, EOM's intact. Fundi benign. Ears: normal TM's and external ear canals both ears Nose: Nares normal. Septum midline. Mucosa normal. No drainage or sinus tenderness. Throat: lips, mucosa, and tongue normal; teeth and gums normal Neck: no adenopathy and supple, symmetrical, trachea midline Lungs: clear to auscultation bilaterally except for some wheezing with coughing Heart: regular rate and  rhythm Extremities: extremities normal, atraumatic, no cyanosis or edema Skin: Skin color, texture, turgor normal. No rashes or lesions Lymph nodes: no lymphadenopathy    Labs Reviewed - No data to display   Allergies  Allergen Reactions  . Gadolinium      Code: HIVES, Desc: Multihance--pt had hives, Onset Date: [id number] IVP Dye  . Penicillins     REACTION: rash  . Sulfonamide Derivatives     REACTION: rash    PMHx, SurgHx, SocialHx, Medications, and Allergies were reviewed in the Visit Navigator and updated as appropriate.   Prior to Admission medications   Medication Sig Start Date End Date Taking? Authorizing Provider  DEXILANT 60 MG capsule TAKE 1 CAPSULE BY MOUTH DAILY 02/03/16  Yes Panosh, Standley Brooking, MD  ibuprofen (ADVIL,MOTRIN) 200 MG tablet Take 600 mg by mouth every 6 (six) hours as needed for moderate pain.    Yes [provider]  Multiple Vitamin (MULTI-VITAMIN PO) Take 1 tablet by mouth daily.   Yes [provider]  SUMAtriptan (IMITREX) 100 MG tablet TAKE 1 TABLET BY MOUTH IMMEDIATELY MAY REPEAT AFTER 2 HOURS 10/03/16  Yes Panosh, Standley Brooking, MD  albuterol (PROVENTIL HFA;VENTOLIN HFA) 108 (90 Base) MCG/ACT inhaler Inhale 2 puffs into the lungs every 4 (four) hours as needed for wheezing. 11/05/16   Vanessa Kick, MD  promethazine-codeine (PHENERGAN WITH CODEINE) 6.25-10 MG/5ML syrup Take 5 mLs by mouth every 6 (six) hours as needed for cough. 11/05/16   Vanessa Kick, MD         Vanessa Kick, MD 11/07/16 [phone]

## 2016-11-21 ENCOUNTER — Other Ambulatory Visit: Payer: Self-pay | Admitting: Internal Medicine

## 2017-01-16 ENCOUNTER — Other Ambulatory Visit: Payer: Self-pay | Admitting: Internal Medicine

## 2017-01-16 NOTE — Telephone Encounter (Signed)
Please advise on refill Last OV 03/16/16 Last filled on 11/21/16 Qty #9 Rf #0

## 2017-01-17 NOTE — Telephone Encounter (Signed)
Physician approved medication. Will close this message

## 2017-01-20 ENCOUNTER — Ambulatory Visit (INDEPENDENT_AMBULATORY_CARE_PROVIDER_SITE_OTHER)
Admission: RE | Admit: 2017-01-20 | Discharge: 2017-01-20 | Disposition: A | Discharge status: Home / Self Care | Payer: BLUE CROSS/BLUE SHIELD | Source: Ambulatory Visit | Attending: Internal Medicine | Admitting: Internal Medicine

## 2017-01-20 ENCOUNTER — Ambulatory Visit (INDEPENDENT_AMBULATORY_CARE_PROVIDER_SITE_OTHER): Payer: BLUE CROSS/BLUE SHIELD | Admitting: Internal Medicine

## 2017-01-20 ENCOUNTER — Encounter: Payer: Self-pay | Admitting: Internal Medicine

## 2017-01-20 VITALS — BP 110/80 | HR 76 | Temp 98.3°F | Wt 197.0 lb

## 2017-01-20 DIAGNOSIS — R0789 Other chest pain: Secondary | ICD-10-CM

## 2017-01-20 NOTE — Patient Instructions (Addendum)
Your chest pain does not sound cardiac based on context and location and quality. However I can't explain it. EKG shows no acute findings get a chest x-ray as discussed Make sure your not  eating late at night to avoid esophagus symptoms. If there are any questions of chest tightness with exercise that could be deconditioning we can get a cardiology consult referral consideration of exercise evaluation.

## 2017-01-20 NOTE — Progress Notes (Signed)
Chief Complaint  Patient presents with  . Chest Pain    HPI: Debra [name] 51 y.o.  SDA appt today  Comes with her husband  She had an episode of acute chest pain that lasted about 15 minutes sharp left Pierce sternal radiating to the back without associated shortness of breath fever diaphoresis last night at 3:30 in the morning. She doesn't have a history of this although husband does relate that she gets occasional pain that is gone to her back over the years. She was a bit apprehensive about this and although feels normal for her today decided to come in and get it checked out. She did have one of her migraines and took an Imitrex. No GI symptoms at this time that her over baseline Ate   9pm  .  No assoc sx sob   And poss nausea.   Had headache.     went away  After 15 minutes.     [place]  Exercise  60 minutes . Sever work outs  and not this pain   Has been doinga bout a month does ocass get a little left para chest tightness  That she thinks could be from   Extreme exertion ,.        ROS: See pertinent positives and negatives per HPI. ocass leuq pain when exercising  No radiation to shoulder   Past Medical History:  Diagnosis Date  . Abdominal pain, epigastric 01/06/2010  . Chronic headaches   . Esophagitis   . ESOPHAGITIS, HX OF 02/18/2010  . EXTERNAL HEMORRHOIDS 02/18/2010  . FATIGUE 04/13/2007  . Gallstones   . GERD 03/13/2007  . Headache(784.0) 03/13/2007  . HIP PAIN, RIGHT 02/23/2009  . LIVER FUNCTION TESTS, ABNORMAL 01/06/2010  . LOW BACK PAIN, MILD 08/06/2008  . Pancreatitis 12/18/09  . PARESTHESIA 03/13/2007  . Pulmonary nodule    incidental on ct 2007  neg cxray 2011  . Ruptured ovarian cyst   . VARICOSE VEIN 01/06/2010    Family History  Problem Relation Age of Onset  . Alcohol abuse Father   . Cardiomyopathy Father   . Anemia Father   . Heart disease Father   . Other Father        Irrg. Heart Beat  . Lung cancer Mother   . Diabetes Mother   . Other  Brother        Irr. Heart Beat  . Pancreatitis Maternal Grandfather   . Colon cancer Maternal Grandmother     Social History   Social History  . Marital status: Married    Spouse name: N/A  . Number of children: 0  . 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 Smoking status: Never Smoker  . Smokeless tobacco: Never Used  . Alcohol use No     Comment: 2 per month  . Drug use: No  . Sexual activity: Not Asked   Other Topics Concern  . 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  Not working since [employer] from [employer]    hx fertility treatment    hh of 3   6- 8 hours  Sleep    3 diet coke per day   2 etoh per month    4 yoyear okd  adopted at 7 weeks  gracie    [employer]  Outpatient Medications Prior to Visit  Medication Sig Dispense Refill  . DEXILANT 60 MG capsule TAKE 1 CAPSULE BY MOUTH DAILY 30 capsule 2  . ibuprofen (ADVIL,MOTRIN) 200 MG tablet Take 600 mg by mouth every 6 (six) hours as needed for moderate pain.     . Multiple Vitamin (MULTI-VITAMIN PO) Take 1 tablet by mouth daily.    . SUMAtriptan (IMITREX) 100 MG tablet TAKE 1 TABLET BY MOUTH IMMEDIATELY MAY REPEAT AFTER 2 HOURS 9 tablet 0  . albuterol (PROVENTIL HFA;VENTOLIN HFA) 108 (90 Base) MCG/ACT inhaler Inhale 2 puffs into the lungs every 4 (four) hours as needed for wheezing. 1 Inhaler 0  . promethazine-codeine (PHENERGAN WITH CODEINE) 6.25-10 MG/5ML syrup Take 5 mLs by mouth every 6 (six) hours as needed for cough. 120 mL 0   No facility-administered medications prior to visit.      EXAM:  BP 110/80 (BP Location: Right Arm, Patient Position: Sitting, Cuff Size: Normal)   Pulse 76   Temp 98.3 F (36.8 C) (Oral)   Wt 197 lb (89.4 kg)   LMP 01/19/2017   SpO2 97%   BMI 30.40 kg/m   Body mass index is 30.4 kg/m.  GENERAL: vitals reviewed and listed  above, alert, oriented, appears well hydrated and in no acute distress HEENT: atraumatic, conjunctiva  clear, no obvious abnormalities on inspection of external nose and ears OP : no lesion edema or exudate  NECK: no obvious masses on inspection palpation  LUNGS: clear to auscultation bilaterally, no wheezes, rales or rhonchi, good air movement no obv cp tenderness  CV: HRRR, no clubbing cyanosis or  peripheral edema nl cap refill  MS: moves all extremities without noticeable focal  abnormality PSYCH: pleasant and cooperative, no obvious depression or anxiety EKG NSR read as low voltage in precordial leads  No acute findings  Nl intervals ASSESSMENT AND PLAN:  Discussed the following assessment and plan:  Other chest pain - Plan: EKG 12-Lead, DG Chest 2 View, Ambulatory referral to Cardiology Sounds  Atypical for cardiac  .   Consideration of  Card consults  Poss POET  because of minimal but present  sx with extreme exrcise  Remote hx of ett.    Does have hx of esoph issues but not typical      . Cont hearlth healthy  Attention and  Avoid lat night eating  Will get cards to  [place]   But get back with [place] if recurrence earlier  -Patient advised to return or notify health care team  if symptoms worsen ,persist or new concerns arise.  Patient Instructions  Your chest pain does not sound cardiac based on context and location and quality. However I can't explain it. EKG shows no acute findings get a chest x-ray as discussed Make sure your not  eating late at night to avoid esophagus symptoms. If there are any questions of chest tightness with exercise that could be deconditioning we can get a cardiology consult referral consideration of exercise evaluation.      Standley Brooking. Panosh M.D.

## 2017-01-24 ENCOUNTER — Encounter: Payer: Self-pay | Admitting: Cardiology

## 2017-01-24 ENCOUNTER — Ambulatory Visit (INDEPENDENT_AMBULATORY_CARE_PROVIDER_SITE_OTHER): Payer: BLUE CROSS/BLUE SHIELD | Admitting: Cardiology

## 2017-01-24 VITALS — BP 90/56 | HR 68 | Resp 12 | Ht 68.0 in | Wt 197.0 lb

## 2017-01-24 DIAGNOSIS — R0789 Other chest pain: Secondary | ICD-10-CM | POA: Diagnosis not present

## 2017-01-24 DIAGNOSIS — E785 Hyperlipidemia, unspecified: Secondary | ICD-10-CM | POA: Diagnosis not present

## 2017-01-24 DIAGNOSIS — G43109 Migraine with aura, not intractable, without status migrainosus: Secondary | ICD-10-CM

## 2017-01-24 NOTE — Addendum Note (Signed)
Addended by: Kathyrn Sheriff on: 01/24/2017 02:50 PM   Modules accepted: Orders

## 2017-01-24 NOTE — Progress Notes (Signed)
Cardiology Consultation:    Date:  01/24/2017   ID:  Andrez Grime, DOB 12-23-1965, MRN [id number]  PCP:  Burnis Medin, MD  Cardiologist:  Jenne Campus, MD   Referring MD: Burnis Medin, MD   Chief Complaint  Patient presents with  . Chest Pain  She's having chest pain  History of Present Illness:    Debra [name] is a 51 y.o. female who is being seen today for the evaluation of Chest pain at the request of Panosh, Standley Brooking, MD. Last Thursday which was just few days ago she woke up in the middle of denied with chest pain. It was stopping piercing type pain lasting for about 10-15 minutes she great severity of the pain 4-5 scale up to 10. She waited for about 10-15 minutes pain subsided and she was able to go to sleep. Of any sweating no shortness of breath associated with this sensation. She never had pain like this before. She is trying to lose some weight and started exercising on the regular basis. She goes to [place] where she has to exercise to keep her heart rate with an orange range. She doesn't twice a week and usually have no difficulties however she described 3 times episodes that she developed chest tightness when she was doing it. He would continue exercising and sensation would go away. She does not have too many risk factors for coronary artery disease, there is no hypertension, no diabetes, her cholesterol was quite acceptable her LDL is above 100 but the same time her HDL is high. She does not have family history of premature coronary artery disease she never smoked.  Past Medical History:  Diagnosis Date  . Abdominal pain, epigastric 01/06/2010  . Chronic headaches   . Esophagitis   . ESOPHAGITIS, HX OF 02/18/2010  . EXTERNAL HEMORRHOIDS 02/18/2010  . FATIGUE 04/13/2007  . Gallstones   . GERD 03/13/2007  . Headache(784.0) 03/13/2007  . HIP PAIN, RIGHT 02/23/2009  . LIVER FUNCTION TESTS, ABNORMAL 01/06/2010  . LOW BACK PAIN, MILD 08/06/2008  . Pancreatitis  12/18/09  . PARESTHESIA 03/13/2007  . Pulmonary nodule    incidental on ct 2007  neg cxray 2011  . Ruptured ovarian cyst   . VARICOSE VEIN 01/06/2010    Past Surgical History:  Procedure Laterality Date  . CHOLECYSTECTOMY    . TONSILLECTOMY    . TONSILLECTOMY      Current Medications: No outpatient prescriptions have been marked as taking for the 01/24/17 encounter (Office Visit) with Park Liter, MD.     Allergies:   Gadolinium; Penicillins; and Sulfonamide derivatives   Social History   Social History  . Marital status: Married    Spouse name: N/A  . Number of children: 0  . 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 Smoking status: Never Smoker  . Smokeless tobacco: Never Used  . Alcohol use Yes     Comment: 2 per month  . Drug use: No  . Sexual activity: Not Asked   Other Topics Concern  . 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  Not working since [employer] from [employer]    hx fertility treatment    hh of 3   6- 8 hours  Sleep    3 diet coke per day   2 etoh  per month    4 yoyear okd  adopted at 7 weeks  gracie    Building house                        Family History: The patient's family history includes Alcohol abuse in her father; Anemia in her father; Cardiomyopathy in her father; Colon cancer in her maternal grandmother; Diabetes in her mother; Heart disease in her father; Lung cancer in her mother; Other in her brother and father; Pancreatitis in her maternal grandfather. ROS:   Please see the history of present illness.    All 14 point review of systems negative except as described per history of present illness.  EKGs/Labs/Other Studies Reviewed:    The following studies were reviewed today: EKG done at her primary care physician was perfectly normal showing no ST segment changes    Recent Labs: 03/16/2016: ALT 12;  BUN 16; Creatinine, Ser 0.78; Hemoglobin 13.5; Platelets 266.0; Potassium 4.3; Sodium 138; TSH 1.39  Recent Lipid Panel    Component Value Date/Time   CHOL 179 03/16/2016 1310   TRIG 74.0 03/16/2016 1310   HDL 60.20 03/16/2016 1310   CHOLHDL 3 03/16/2016 1310   VLDL 14.8 03/16/2016 1310   LDLCALC 104 (H) 03/16/2016 1310    Physical Exam:    VS:  BP (!) 90/56   Pulse 68   Resp 12   Ht 5\' 8"  (1.727 m)   Wt 197 lb (89.4 kg)   LMP 01/19/2017   BMI 29.95 kg/m     Wt Readings from Last 3 Encounters:  01/24/17 197 lb (89.4 kg)  01/20/17 197 lb (89.4 kg)  10/26/16 189 lb (85.7 kg)     GEN:  Well nourished, well developed in no acute distress HEENT: Normal NECK: No JVD; No carotid bruits LYMPHATICS: No lymphadenopathy CARDIAC: RRR, no murmurs, no rubs, no gallops RESPIRATORY:  Clear to auscultation without rales, wheezing or rhonchi  ABDOMEN: Soft, non-tender, non-distended MUSCULOSKELETAL:  No edema; No deformity  SKIN: Warm and dry NEUROLOGIC:  Alert and oriented x 3 PSYCHIATRIC:  Normal affect   ASSESSMENT:    1. Atypical chest pain   2. Migraine with aura and without status migrainosus, not intractable   3. Dyslipidemia    PLAN:    In order of problems listed above:  1. Atypical chest pain in this lady with low probability of coronary artery disease. I still think this patient view of the fact that she would like to intensified her exercises to do stress test. I'm not for about the pain did awaken her in the middle of the night, I'm worried more about the tightness that she developed when she is exercising at the gym. I asked her until we distress does not push her stools heart. Also asked to start taking one aspirin every single day. Only 81 mg. 2. Migraine: She's follow-up by neurologist. Apparently migraines became more frequent recently. 3. His lipidemia: So far I don't see indications for medications however this is something that we'll be revisited after stress  test.  She tried to exercise more lose some weight. She is very dissatisfied with her weight. We talked about healthy lifestyle due to diet watching what she eats modifying her diet.   Medication Adjustments/Labs and Tests Ordered: Current medicines are reviewed at length with the patient today.  Concerns regarding medicines are outlined above.  No orders of the defined types were placed in this encounter.  No orders of  the defined types were placed in this encounter.   Signed, Park Liter, MD, [facility]. 01/24/2017 2:30 PM    [place]

## 2017-01-24 NOTE — Patient Instructions (Signed)
Medication Instructions:  Your physician recommends that you continue on your current medications as directed. Please refer to the Current Medication list given to you today.  Labwork: None   Testing/Procedures: Your physician has requested that you have an exercise tolerance test. For further information please visit [email or web address]. Please also follow instruction sheet, as given.   Follow-Up: Your physician recommends that you schedule a follow-up appointment in: 1 month   Any Other Special Instructions Will Be Listed Below (If Applicable).  Please note that any paperwork needing to be filled out by the provider will need to be addressed at the front desk prior to seeing the provider. Please note that any paperwork FMLA, Disability or other documents regarding health condition is subject to a $25.00 charge that must be received prior to completion of paperwork in the form of a money order or check.    If you need a refill on your cardiac medications before your next appointment, please call your pharmacy.

## 2017-01-31 ENCOUNTER — Ambulatory Visit (INDEPENDENT_AMBULATORY_CARE_PROVIDER_SITE_OTHER): Payer: BLUE CROSS/BLUE SHIELD

## 2017-01-31 DIAGNOSIS — R0789 Other chest pain: Secondary | ICD-10-CM

## 2017-01-31 LAB — EXERCISE TOLERANCE TEST
Estimated workload: 9.4 METS
Exercise duration (min): 7 min
Exercise duration (sec): 34 s
MPHR: 169 {beats}/min
Peak HR: 160 {beats}/min
Percent HR: 94 %
RPE: 19
Rest HR: 80 {beats}/min

## 2017-02-10 ENCOUNTER — Encounter: Payer: Self-pay | Admitting: Internal Medicine

## 2017-02-16 ENCOUNTER — Other Ambulatory Visit: Payer: Self-pay | Admitting: Internal Medicine

## 2017-02-21 NOTE — Telephone Encounter (Signed)
Medication filled to pharmacy as requested.   

## 2017-02-23 ENCOUNTER — Ambulatory Visit: Payer: BLUE CROSS/BLUE SHIELD | Admitting: Cardiology

## 2017-02-27 ENCOUNTER — Ambulatory Visit (INDEPENDENT_AMBULATORY_CARE_PROVIDER_SITE_OTHER): Payer: BLUE CROSS/BLUE SHIELD | Admitting: Cardiology

## 2017-02-27 ENCOUNTER — Encounter: Payer: Self-pay | Admitting: Cardiology

## 2017-02-27 VITALS — BP 122/64 | HR 76 | Resp 14 | Ht 68.0 in | Wt 198.0 lb

## 2017-02-27 DIAGNOSIS — R0789 Other chest pain: Secondary | ICD-10-CM

## 2017-02-27 DIAGNOSIS — E785 Hyperlipidemia, unspecified: Secondary | ICD-10-CM | POA: Diagnosis not present

## 2017-02-27 NOTE — Progress Notes (Signed)
Cardiology Office Note:    Date:  02/27/2017   ID:  Debra [name], DOB 1965-12-23, MRN [id number]  PCP:  Burnis Medin, MD  Cardiologist:  Jenne Campus, MD    Referring MD: Burnis Medin, MD   Chief Complaint  Patient presents with  . 1 month follow up  I am doing fine  History of Present Illness:    Debra [name] is a 51 y.o. female  who myself because of atypical chest pain. She did have a stress test which was good in quality and there were no evidence of ischemia, no chest pain. Since that time I've seen her last and she reports one episode of chest pain which was short up and doing exercise and disappear in spite of continuation of exercise. She's tried to push herself hard and exercises he showed me her heart rate which Rich about 148 while exercises and overall doing well. We talked in length about the situation I advised him to continue exercises, advice is to keep taking aspirin. I will not start her cholesterol medication since he is exercising and trying to lose weight and does have history of liver function tests abnormalities. I would let to see her back him off somewhat 2 months to see how she does also told her she needed to let me know if she develops some more chest pain.  Past Medical History:  Diagnosis Date  . Abdominal pain, epigastric 01/06/2010  . Chronic headaches   . Esophagitis   . ESOPHAGITIS, HX OF 02/18/2010  . EXTERNAL HEMORRHOIDS 02/18/2010  . FATIGUE 04/13/2007  . Gallstones   . GERD 03/13/2007  . Headache(784.0) 03/13/2007  . HIP PAIN, RIGHT 02/23/2009  . LIVER FUNCTION TESTS, ABNORMAL 01/06/2010  . LOW BACK PAIN, MILD 08/06/2008  . Pancreatitis 12/18/09  . PARESTHESIA 03/13/2007  . Pulmonary nodule    incidental on ct 2007  neg cxray 2011  . Ruptured ovarian cyst   . VARICOSE VEIN 01/06/2010    Past Surgical History:  Procedure Laterality Date  . CHOLECYSTECTOMY    . TONSILLECTOMY    . TONSILLECTOMY      Current  Medications: Current Meds  Medication Sig  . DEXILANT 60 MG capsule TAKE 1 CAPSULE BY MOUTH DAILY  . ibuprofen (ADVIL,MOTRIN) 200 MG tablet Take 600 mg by mouth every 6 (six) hours as needed for moderate pain.   . Multiple Vitamin (MULTI-VITAMIN PO) Take 1 tablet by mouth daily.  . SUMAtriptan (IMITREX) 100 MG tablet TAKE 1 TABLET BY MOUTH IMMEDIATELY MAY REPEAT AFTER 2 HOURS     Allergies:   Gadolinium; Penicillins; and Sulfonamide derivatives   Social History   Social History  . Marital status: Married    Spouse name: N/A  . Number of children: 0  . 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 Smoking status: Never Smoker  . Smokeless tobacco: Never Used  . Alcohol use Yes     Comment: 2 per month  . Drug use: No  . Sexual activity: Not Asked   Other Topics Concern  . 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  Not working since [employer] from [employer]    hx fertility treatment    hh of 3   6- 8 hours  Sleep    3 diet coke per day  2 etoh per month    4 yoyear okd  adopted at 7 weeks  gracie    Building house                        Family History: The patient's family history includes Alcohol abuse in her father; Anemia in her father; Cardiomyopathy in her father; Colon cancer in her maternal grandmother; Diabetes in her mother; Heart disease in her father; Lung cancer in her mother; Other in her brother and father; Pancreatitis in her maternal grandfather. ROS:   Please see the history of present illness.    All 14 point review of systems negative except as described per history of present illness  EKGs/Labs/Other Studies Reviewed:      Recent Labs: 03/16/2016: ALT 12; BUN 16; Creatinine, Ser 0.78; Hemoglobin 13.5; Platelets 266.0; Potassium 4.3; Sodium 138; TSH 1.39  Recent Lipid Panel    Component Value Date/Time   CHOL 179 03/16/2016 1310    TRIG 74.0 03/16/2016 1310   HDL 60.20 03/16/2016 1310   CHOLHDL 3 03/16/2016 1310   VLDL 14.8 03/16/2016 1310   LDLCALC 104 (H) 03/16/2016 1310    Physical Exam:    VS:  BP 122/64   Pulse 76   Resp 14   Ht 5\' 8"  (1.727 m)   Wt 198 lb (89.8 kg)   BMI 30.11 kg/m     Wt Readings from Last 3 Encounters:  02/27/17 198 lb (89.8 kg)  01/24/17 197 lb (89.4 kg)  01/20/17 197 lb (89.4 kg)     GEN:  Well nourished, well developed in no acute distress HEENT: Normal NECK: No JVD; No carotid bruits LYMPHATICS: No lymphadenopathy CARDIAC: RRR, no murmurs, no rubs, no gallops RESPIRATORY:  Clear to auscultation without rales, wheezing or rhonchi  ABDOMEN: Soft, non-tender, non-distended MUSCULOSKELETAL:  No edema; No deformity  SKIN: Warm and dry LOWER EXTREMITIES: no swelling NEUROLOGIC:  Alert and oriented x 3 PSYCHIATRIC:  Normal affect   ASSESSMENT:    1. Dyslipidemia   2. Atypical chest pain    PLAN:    In order of problems listed above:  1. Dyslipidemia: We will wait for repeat her cholesterol within next few months, she will exercise aggressively and lose some weight. 2. Atypical chest pain: Stresses good-quality negative however still I will review this issue in about 2 months. 3. Fatigue: Getting better with exercise.   Medication Adjustments/Labs and Tests Ordered: Current medicines are reviewed at length with the patient today.  Concerns regarding medicines are outlined above.  No orders of the defined types were placed in this encounter.  Medication changes: No orders of the defined types were placed in this encounter.   Signed, Park Liter, MD, [facility] 02/27/2017 11:03 AM    [place]

## 2017-02-27 NOTE — Patient Instructions (Signed)
Medication Instructions:  Your physician recommends that you continue on your current medications as directed. Please refer to the Current Medication list given to you today.  Labwork: None   Testing/Procedures: None   Follow-Up: Your physician recommends that you schedule a follow-up appointment in: 2 months   Any Other Special Instructions Will Be Listed Below (If Applicable).  Please note that any paperwork needing to be filled out by the provider will need to be addressed at the front desk prior to seeing the provider. Please note that any paperwork FMLA, Disability or other documents regarding health condition is subject to a $25.00 charge that must be received prior to completion of paperwork in the form of a money order or check.     If you need a refill on your cardiac medications before your next appointment, please call your pharmacy.

## 2017-04-18 DIAGNOSIS — M2242 Chondromalacia patellae, left knee: Secondary | ICD-10-CM | POA: Diagnosis not present

## 2017-05-02 ENCOUNTER — Ambulatory Visit: Payer: BLUE CROSS/BLUE SHIELD | Admitting: Cardiology

## 2017-05-17 ENCOUNTER — Encounter (HOSPITAL_COMMUNITY): Payer: Self-pay | Admitting: Emergency Medicine

## 2017-05-17 ENCOUNTER — Ambulatory Visit (HOSPITAL_COMMUNITY)
Admission: EM | Admit: 2017-05-17 | Discharge: 2017-05-17 | Disposition: A | Discharge status: Home / Self Care | Payer: BLUE CROSS/BLUE SHIELD | Attending: Family Medicine | Admitting: Family Medicine

## 2017-05-17 ENCOUNTER — Other Ambulatory Visit: Payer: Self-pay

## 2017-05-17 DIAGNOSIS — R05 Cough: Secondary | ICD-10-CM | POA: Diagnosis not present

## 2017-05-17 DIAGNOSIS — K219 Gastro-esophageal reflux disease without esophagitis: Secondary | ICD-10-CM | POA: Diagnosis not present

## 2017-05-17 DIAGNOSIS — J069 Acute upper respiratory infection, unspecified: Secondary | ICD-10-CM | POA: Diagnosis not present

## 2017-05-17 DIAGNOSIS — M76892 Other specified enthesopathies of left lower limb, excluding foot: Secondary | ICD-10-CM | POA: Diagnosis not present

## 2017-05-17 DIAGNOSIS — R51 Headache: Secondary | ICD-10-CM | POA: Insufficient documentation

## 2017-05-17 DIAGNOSIS — R5383 Other fatigue: Secondary | ICD-10-CM

## 2017-05-17 LAB — POCT I-STAT, CHEM 8
BUN: 15 mg/dL (ref 6–20)
Calcium, Ion: 1.19 mmol/L (ref 1.15–1.40)
Chloride: 103 mmol/L (ref 101–111)
Creatinine, Ser: 0.9 mg/dL (ref 0.44–1.00)
Glucose, Bld: 86 mg/dL (ref 65–99)
HCT: 44 % (ref 36.0–46.0)
Hemoglobin: 15 g/dL (ref 12.0–15.0)
Potassium: 4 mmol/L (ref 3.5–5.1)
Sodium: 140 mmol/L (ref 135–145)
TCO2: 27 mmol/L (ref 22–32)

## 2017-05-17 LAB — TSH: TSH: 1.286 u[IU]/mL (ref 0.350–4.500)

## 2017-05-17 MED ORDER — PREDNISONE 20 MG PO TABS: ORAL_TABLET | ORAL | 0 refills | Status: DC

## 2017-05-17 NOTE — ED Provider Notes (Signed)
[place]   [id number] 05/17/17 Arrival Time: 1306   SUBJECTIVE:  Debra [name] is a 51 y.o. female who presents to the urgent care with complaint of cough that she has had since Thanksgiving along with a lot of fatigue.  Patient also wants left knee evaluated, having over 4 weeks of left knee soreness after new exercise.  No STS or crepitus.  No direct trauma. Had evaluation and x-rays by orthopod x 2, first time "no arthritis" but follow up "arthritis" diagnosed.   Past Medical History:  Diagnosis Date  . Abdominal pain, epigastric 01/06/2010  . Chronic headaches   . Esophagitis   . ESOPHAGITIS, HX OF 02/18/2010  . EXTERNAL HEMORRHOIDS 02/18/2010  . FATIGUE 04/13/2007  . Gallstones   . GERD 03/13/2007  . Headache(784.0) 03/13/2007  . HIP PAIN, RIGHT 02/23/2009  . LIVER FUNCTION TESTS, ABNORMAL 01/06/2010  . LOW BACK PAIN, MILD 08/06/2008  . Pancreatitis 12/18/09  . PARESTHESIA 03/13/2007  . Pulmonary nodule    incidental on ct 2007  neg cxray 2011  . Ruptured ovarian cyst   . VARICOSE VEIN 01/06/2010   Family History  Problem Relation Age of Onset  . Alcohol abuse Father   . Cardiomyopathy Father   . Anemia Father   . Heart disease Father   . Other Father        Irrg. Heart Beat  . Lung cancer Mother   . Diabetes Mother   . Other Brother        Irr. Heart Beat  . Pancreatitis Maternal Grandfather   . Colon cancer Maternal Grandmother    Social History   Socioeconomic History  . Marital status: Married    Spouse name: Not on file  . Number of children: 0  . Years of education: Not on file  . Highest education level: Not on file  Social Needs  . Financial resource strain: Not on file  . Food insecurity - worry: Not on file  . Food insecurity - inability: Not on file  . Transportation needs - medical: Not on file  . Transportation needs - non-medical: Not on file  Occupational History  . Occupation: PHARMACIST    Employer: [employer]    Tobacco Use  . Smoking status: Never Smoker  . Smokeless tobacco: Never Used  Substance and Sexual Activity  . Alcohol use: Yes    Comment: 2 per month  . Drug use: No  . Sexual activity: Not on file  Other Topics Concern  . Not on file  Social History Narrative   Dogs 2     married is a Software engineer never smoked.  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 [employer]    hx fertility treatment    hh of 3   6- 8 hours  Sleep    3 diet coke per day   2 etoh per month    4 yoyear okd  adopted at 7 weeks  gracie    Building house                   Current Meds  Medication Sig  . ibuprofen (ADVIL,MOTRIN) 200 MG tablet Take 600 mg by mouth every 6 (six) hours as needed for moderate pain.   . Multiple Vitamin (MULTI-VITAMIN PO) Take 1 tablet by mouth daily.  . SUMAtriptan (IMITREX) 100 MG tablet TAKE 1 TABLET BY MOUTH IMMEDIATELY MAY REPEAT AFTER 2 HOURS   Allergies  Allergen Reactions  . Gadolinium  Code: HIVES, Desc: Multihance--pt had hives, Onset Date: [id number] IVP Dye  . Penicillins     REACTION: rash  . Sulfonamide Derivatives     REACTION: rash      ROS: As per HPI, remainder of ROS negative.   OBJECTIVE:   Vitals:   05/17/17 1356  BP: 93/73  Pulse: 70  Temp: 98.7 F (37.1 C)  TempSrc: Oral  SpO2: 100%     General appearance: alert; no distress Eyes: PERRL; EOMI; conjunctiva normal HENT: normocephalic; atraumatic; TMs normal, canal normal, external ears normal without trauma; nasal mucosa normal; oral mucosa normal Neck: supple Lungs: clear to auscultation bilaterally Heart: regular rate and rhythm Abdomen: soft, non-tender; bowel sounds normal; no masses or organomegaly; no guarding or rebound tenderness Back: no CVA tenderness Extremities: no cyanosis or edema; symmetrical with no gross deformities;  Tender inferior to left patella.  Good ROM Skin: warm and dry, two 1 mm red papules left leg Neurologic: normal gait; grossly  normal Psychological: alert and cooperative; normal mood and affect      Labs:  Results for orders placed or performed in visit on 01/31/17  Exercise Tolerance Test  Result Value Ref Range   Rest HR 80 bpm   Rest BP 115/79 mmHg   RPE 19    Exercise duration (sec) 34 sec   Percent HR 94 %   Exercise duration (min) 7 min   Estimated workload 9.4 METS   Peak HR 160 bpm   Peak BP 185/66 mmHg   MPHR 169 bpm    Labs Reviewed  TSH    No results found.     ASSESSMENT & PLAN:  1. Upper respiratory tract infection, unspecified type   2. Left knee tendonitis     Meds ordered this encounter  Medications  . predniSONE (DELTASONE) 20 MG tablet    Sig: one daily with food    Dispense:  5 tablet    Refill:  0    Reviewed expectations re: course of current medical issues. Questions answered. Outlined signs and symptoms indicating need for more acute intervention. Patient verbalized understanding. After Visit Summary given.    Procedures:      Robyn Haber, MD 05/17/17 1439

## 2017-05-17 NOTE — ED Triage Notes (Signed)
Pt reports a cough that she has had since Thanksgiving along with a lot of fatigue.

## 2017-05-17 NOTE — Discharge Instructions (Signed)
Follow-up with Dr. Regis Bill if your symptoms persist.  You can find the results of today's labs using Mychart tomorrow.

## 2017-05-18 ENCOUNTER — Other Ambulatory Visit: Payer: Self-pay | Admitting: Internal Medicine

## 2017-05-24 NOTE — Progress Notes (Signed)
Chief Complaint  Patient presents with  . Back Pain    c/o lower back pain x 2-3 months.  . Cough    Cough x 6 weeks. Seen by UC and was given Prednisone. Worse with eating. Pt not sure if related to reflux or viral.    HPI: Debra [name] 52 y.o.  SDA  Was seen UC ED  Last week  deo resp infection   And tendinitis  Left knee    Cough   Since t giving .  Get  Bad spells.     pred may have helped  When on it. But coming back .  ? irrirate   Reflux.   At bed time. Since last week.  No fever henmptysis or real dyspnea  Restarted left over dexilant  No tobacco    Left leg  Stiffness  Knee  And saw dr Alfonso Ramus   And told her arthritis .  And  Mri if not better .  avaoiding repetetive   Knee.  Doing classes .      No injury   Saw dr Alfonso Ramus   Who said arthritis and   No   Other advice   To help and ? Should she cancel her exercise program  Pain in front of knee    No instability  o fever  1. Upper respiratory tract infection, unspecified type   2. Left knee tendonitis         Meds ordered this encounter  Medications  . predniSONE (DELTASONE) 20 MG tablet    Sig: one daily with food    Dispense:  5 tablet    Refill:  0    ROS: See pertinent positives and negatives per HPI.  Past Medical History:  Diagnosis Date  . Abdominal pain, epigastric 01/06/2010  . Chronic headaches   . Esophagitis   . ESOPHAGITIS, HX OF 02/18/2010  . EXTERNAL HEMORRHOIDS 02/18/2010  . FATIGUE 04/13/2007  . Gallstones   . GERD 03/13/2007  . Headache(784.0) 03/13/2007  . HIP PAIN, RIGHT 02/23/2009  . LIVER FUNCTION TESTS, ABNORMAL 01/06/2010  . LOW BACK PAIN, MILD 08/06/2008  . Pancreatitis 12/18/09  . PARESTHESIA 03/13/2007  . Pulmonary nodule    incidental on ct 2007  neg cxray 2011  . Ruptured ovarian cyst   . VARICOSE VEIN 01/06/2010    Family History  Problem Relation Age of Onset  . Alcohol abuse Father   . Cardiomyopathy Father   . Anemia Father   . Heart disease Father   .  Other Father        Irrg. Heart Beat  . Lung cancer Mother   . Diabetes Mother   . Other Brother        Irr. Heart Beat  . Pancreatitis Maternal Grandfather   . Colon cancer Maternal Grandmother     Social History   Socioeconomic History  . Marital status: Married    Spouse name: None  . Number of children: 0  . Years of education: None  . Highest education level: None  Social Needs  . Financial resource strain: None  . Food insecurity - worry: None  . Food insecurity - inability: None  . Transportation needs - medical: None  . Transportation needs - non-medical: None  Occupational History  . Occupation: PHARMACIST    Employer: [employer]  Tobacco Use  . Smoking status: Never Smoker  . Smokeless tobacco: Never Used  Substance and Sexual Activity  . Alcohol use: Yes  Comment: 2 per month  . Drug use: No  . Sexual activity: None  Other Topics Concern  . None  Social History Narrative   Dogs 2     married is a Software engineer never smoked.  Not working since [employer] from [employer]    hx fertility treatment    hh of 3   6- 8 hours  Sleep    3 diet coke per day   2 etoh per month    4 yoyear okd  adopted at 7 weeks  gracie    Building house                    Outpatient Medications Prior to Visit  Medication Sig Dispense Refill  . ibuprofen (ADVIL,MOTRIN) 200 MG tablet Take 600 mg by mouth every 6 (six) hours as needed for moderate pain.     . Multiple Vitamin (MULTI-VITAMIN PO) Take 1 tablet by mouth daily.    . SUMAtriptan (IMITREX) 100 MG tablet TAKE 1 TABLET BY MOUTH IMMEDIATELY MAY REPEAT AFTER 2 HOURS 9 tablet 0  . DEXILANT 60 MG capsule TAKE 1 CAPSULE BY MOUTH DAILY 30 capsule 2  . predniSONE (DELTASONE) 20 MG tablet one daily with food (Patient not taking: Reported on 05/25/2017) 5 tablet 0  . SUMAtriptan (IMITREX) 100 MG tablet TAKE 1 TABLET BY MOUTH IMMEDIATELY MAY REPEAT AFTER 2 HOURS (Patient not taking: Reported on 05/25/2017) 9 tablet 0   No  facility-administered medications prior to visit.      EXAM:  BP 114/72 (BP Location: Right Arm, Patient Position: Sitting, Cuff Size: Normal)   Pulse 80   Temp 98.7 F (37.1 C) (Oral)   Wt 198 lb 6.4 oz (90 kg)   LMP 05/07/2017 (Approximate)   BMI 30.17 kg/m   Body mass index is 30.17 kg/m.  GENERAL: vitals reviewed and listed above, alert, oriented, appears well hydrated and in no acute distress HEENT: atraumatic, conjunctiva  clear, no obvious abnormalities on inspection of external nose and ears OP : no lesion edema or exudate  NECK: no obvious masses on inspection palpation  LUNGS: clear to auscultation bilaterally, no wheezes, rales or rhonchi, good air movement CV: HRRR, no clubbing cyanosis or  peripheral edema nl cap refill  MS: moves all extremities left knee  Some patellar fluid bursa   No point tenderness   Crepitus   No joint line tenderness  PSYCH: pleasant and cooperative, no obvious depression or anxiety Lab Results  Component Value Date   WBC 7.3 03/16/2016   HGB 15.0 05/17/2017   HCT 44.0 05/17/2017   PLT 266.0 03/16/2016   GLUCOSE 86 05/17/2017   CHOL 179 03/16/2016   TRIG 74.0 03/16/2016   HDL 60.20 03/16/2016   LDLCALC 104 (H) 03/16/2016   ALT 12 03/16/2016   AST 17 03/16/2016   NA 140 05/17/2017   K 4.0 05/17/2017   CL 103 05/17/2017   CREATININE 0.90 05/17/2017   BUN 15 05/17/2017   CO2 26 03/16/2016   TSH 1.286 05/17/2017    ASSESSMENT AND PLAN:  Discussed the following assessment and plan:  Cough, persistent  Anterior knee pain, left - Plan: Ambulatory referral to Sports Medicine  Left knee pain, unspecified chronicity - Plan: Ambulatory referral to Sports Medicine Seems to have recurranct prolonged cougihgin illness in past   ? Reflux   Hx of same  Just  started  Old dexilant ( used I past for reflux) .  Refill and if  persistent or progressive then  Plan  Poss  pulm advice  ? Cough variant?   andterior knee pain with some swelling    Advise sports medicine     And plan for exercise  Or intervention She asks about immunity and labs if needed   None  Indicated at this time  -Patient advised to return or notify health care team  if symptoms worsen ,persist or new concerns arise.  Patient Instructions   Will do sports medicine  Referral  Opinion.  For the front knee pain .   Elliptical usually better on   Than treadmill.    Ice after activity   .    At this time.   Refill the dexilant and if not getting better .      Consider getting    Pulmonary evaluation   Poss asthma variant or other .   Contact us  If not better in 2-3 weeks  Or worse and consider seeing  Pulmonary or fu .  ? irritable airway .    Lab Results  Component Value Date   WBC 7.3 03/16/2016   HGB 15.0 05/17/2017   HCT 44.0 05/17/2017   PLT 266.0 03/16/2016   GLUCOSE 86 05/17/2017   CHOL 179 03/16/2016   TRIG 74.0 03/16/2016   HDL 60.20 03/16/2016   LDLCALC 104 (H) 03/16/2016   ALT 12 03/16/2016   AST 17 03/16/2016   NA 140 05/17/2017   K 4.0 05/17/2017   CL 103 05/17/2017   CREATININE 0.90 05/17/2017   BUN 15 05/17/2017   CO2 26 03/16/2016   TSH 1.286 05/17/2017     Mariann Laster K. Panosh M.D.

## 2017-05-25 ENCOUNTER — Ambulatory Visit (INDEPENDENT_AMBULATORY_CARE_PROVIDER_SITE_OTHER): Payer: BLUE CROSS/BLUE SHIELD | Admitting: Internal Medicine

## 2017-05-25 ENCOUNTER — Encounter: Payer: Self-pay | Admitting: Internal Medicine

## 2017-05-25 VITALS — BP 114/72 | HR 80 | Temp 98.7°F | Wt 198.4 lb

## 2017-05-25 DIAGNOSIS — M25562 Pain in left knee: Secondary | ICD-10-CM | POA: Diagnosis not present

## 2017-05-25 DIAGNOSIS — R053 Chronic cough: Secondary | ICD-10-CM

## 2017-05-25 DIAGNOSIS — R05 Cough: Secondary | ICD-10-CM | POA: Diagnosis not present

## 2017-05-25 MED ORDER — DEXLANSOPRAZOLE 60 MG PO CPDR: 1.0000 | DELAYED_RELEASE_CAPSULE | Freq: Every day | ORAL | 2 refills | Status: DC

## 2017-05-25 NOTE — Patient Instructions (Addendum)
Will do sports medicine  Referral  Opinion.  For the front knee pain .   Elliptical usually better on   Than treadmill.    Ice after activity   .    At this time.   Refill the dexilant and if not getting better .      Consider getting    Pulmonary evaluation   Poss asthma variant or other .   Contact us  If not better in 2-3 weeks  Or worse and consider seeing  Pulmonary or fu .  ? irritable airway .    Lab Results  Component Value Date   WBC 7.3 03/16/2016   HGB 15.0 05/17/2017   HCT 44.0 05/17/2017   PLT 266.0 03/16/2016   GLUCOSE 86 05/17/2017   CHOL 179 03/16/2016   TRIG 74.0 03/16/2016   HDL 60.20 03/16/2016   LDLCALC 104 (H) 03/16/2016   ALT 12 03/16/2016   AST 17 03/16/2016   NA 140 05/17/2017   K 4.0 05/17/2017   CL 103 05/17/2017   CREATININE 0.90 05/17/2017   BUN 15 05/17/2017   CO2 26 03/16/2016   TSH 1.286 05/17/2017

## 2017-05-29 ENCOUNTER — Encounter: Payer: Self-pay | Admitting: Sports Medicine

## 2017-05-29 ENCOUNTER — Ambulatory Visit: Payer: BLUE CROSS/BLUE SHIELD | Admitting: Sports Medicine

## 2017-05-29 ENCOUNTER — Ambulatory Visit: Payer: Self-pay

## 2017-05-29 VITALS — BP 126/80 | HR 73 | Ht 66.0 in | Wt 199.0 lb

## 2017-05-29 DIAGNOSIS — G8929 Other chronic pain: Secondary | ICD-10-CM

## 2017-05-29 DIAGNOSIS — M25562 Pain in left knee: Secondary | ICD-10-CM | POA: Diagnosis not present

## 2017-05-29 NOTE — Procedures (Signed)
PROCEDURE NOTE -  ULTRASOUND GUIDEDInjection: LEFT Images were obtained and interpreted by myself, Teresa Coombs, DO  Images have been saved and stored to PACS system. Images obtained on: GE S7 Ultrasound machine  ULTRASOUND FINDINGS:  Patellar tendon is intact Small suprapatellar effusion but mild. Bulging of the medial and lateral meniscus with minimal osteophytic spurring  DESCRIPTION OF PROCEDURE:  The patient's clinical condition is marked by substantial pain and/or significant functional disability. Other conservative therapy has not provided relief, is contraindicated, or not appropriate. There is a reasonable likelihood that injection will significantly improve the patient's pain and/or functional impairment.  After discussing the risks, benefits and expected outcomes of the injection and all questions were reviewed and answered, the patient wished to undergo the above named procedure. Verbal consent was obtained.  The ultrasound was used to identify the target structure and adjacent neurovascular structures. The skin was then prepped in sterile fashion and the target structure was injected under direct visualization using sterile technique as below:  Left PREP: Alcohol, Ethel Chloride,   APPROACH: superiolateral, single injection, 25g 1.5in. INJECTATE:  2cc 0.5% marcaine, 2cc 40mg /mL DepoMedrol  ASPIRATE: N/A DRESSING: Band-Aid and a full knee Body Helix    Post procedural instructions including recommending icing and warning signs for infection were reviewed.  This procedure was well tolerated and there were no complications.   IMPRESSION: Succesful US Guided Injection

## 2017-05-29 NOTE — Procedures (Signed)
PROCEDURE NOTE: THERAPEUTIC EXERCISES (97110) 15 minutes spent for Therapeutic exercises as below and as referenced in the AVS. This included exercises focusing on stretching, strengthening, with significant focus on eccentric aspects.  Proper technique shown and discussed handout in great detail with ATC. All questions were discussed and answered.   Long term goals include an improvement in range of motion, strength, endurance as well as avoiding reinjury. Frequency of visits is one time as determined during today's  office visit. Frequency of exercises to be performed is as per handout.  EXERCISES REVIEWED:  VMO strengthening  Hip abduction strengthening  Goodman exercises

## 2017-05-29 NOTE — Progress Notes (Signed)
Debra [name]. Debra [name], [facility] - 52 y.o. female MRN [id number]  Date of birth: 1965/08/18  Visit Date: 05/29/2017  PCP: Debra Medin, MD   Referred by: Debra Medin, MD   Scribe for today's visit: Debra [name], LAT, ATC     SUBJECTIVE:  Debra [name] is here for New Patient (Initial Visit) (L knee pain)  Referred by: Debra [name] L knee pain symptoms INITIALLY: Began in Sept/Oct 2018 and was associated w/ [name] increase in physical activity while taking [employer] classes Described as an aching 8/10 at it's worst but has no pain at rest and is nonradiating.  Has noticed swelling in [name] L lateral knee. Worsened with deep knee bending exercises and ascending/descending stairs Improved with rest Additional associated symptoms include: no radiating pain and no N/T into the L LE    At this time symptoms are improving compared to onset but still not great. She has been intermittently taking IBU w/ no real relief.   ROS Denies night time disturbances. Denies fevers, chills, or night sweats. Denies unexplained weight loss. Denies personal history of cancer. Denies changes in bowel or bladder habits. Denies recent unreported falls. Denies new or worsening dyspnea or wheezing. Reports headaches or dizziness.  Migraines since she was 52 y/o.  Takes Imitrex. Denies numbness, tingling or weakness  In the extremities.  Denies dizziness or presyncopal episodes Denies lower extremity edema     HISTORY & PERTINENT PRIOR DATA:  Prior History reviewed and updated per electronic medical record.  Significant history, findings, studies and interim changes include:  reports that  has never smoked. she has never used smokeless tobacco. No results for input(s): HGBA1C, LABURIC, CREATINE in the last 8760 hours. No specialty comments available. Problem  Chronic Pain of Left Knee   Injected  05/29/2017 Ultrasound with degenerative bulging of the medial and lateral meniscus, small supraphysiologic effusion Provided Body Helix     OBJECTIVE:  VS:  HT:5\' 6"  (167.6 cm)   WT:199 lb (90.3 kg)  BMI:32.13    BP:126/80  HR:73bpm  TEMP: ( )  RESP:97 %   PHYSICAL EXAM: Constitutional: WDWN, Non-toxic appearing. Psychiatric: Alert & appropriately interactive. Not depressed or anxious appearing. Respiratory: No increased work of breathing. Trachea Midline Eyes: Pupils are equal. EOM intact without nystagmus. No scleral icterus Cardiovascular:  Peripheral Pulses: peripheral pulses symmetrical.  Chronic varicose veins without ulceration or significant edema No clubbing or cyanosis appreciated Capillary Refill is normal, less than 2 seconds No signficant generalized edema/anasarca Sensory Exam: intact to light touch   Right Knee Exam: No significant rashes/lesions/ulcerations overlying the examined area No overlying erythema/ecchymosis. Alignment & Contours: normal Gait: normal Effusion: None   Generalized Synovitis: Mild Tenderness to palpation over: Medial and lateral joint lines, mild ROM: No significant limitations, 0 degrees to 115 degrees Strength: No significant weakness, extensor mechanism intact Hip Abductor Strength: 5/5  Ligamentous testing: Stable to varus/valgus strain Stable to anterior/posterior drawer Solid Lachman's Mcmurray's: Negative Thessaly: Negative  ASSESSMENT & PLAN:   1. Chronic pain of left knee    PLAN:    Chronic pain of left knee Injection today Degenerative bulging appreciated of the medial and lateral meniscus Discussed avoiding hyperflexion of the knee with squatting and rowing with Debra [name] for the next 2 weeks then okay to resume activities as tolerated as long as no persistent mechanical symptoms.  If any lack of improvement  can consider further diagnostic evaluation with x-rays and MRI but suspect this is more functional in  nature   ++++++++++++++++++++++++++++++++++++++++++++ Orders & Meds: Orders Placed This Encounter  Procedures  . Misc procedure  . [place] LIMITED JOINT SPACE STRUCTURES LOW LEFT(NO LINKED CHARGES)    No orders of the defined types were placed in this encounter.   ++++++++++++++++++++++++++++++++++++++++++++ Follow-up: Return in about 6 weeks (around 07/10/2017).   Pertinent documentation may be included in additional procedure notes, imaging studies, problem based documentation and patient instructions. Please see these sections of the encounter for additional information regarding this visit. CMA/ATC served as Education administrator during this visit. History, Physical, and Plan performed by medical provider. Documentation and orders reviewed and attested to.      Debra [name], [facility] Physician

## 2017-05-29 NOTE — Patient Instructions (Addendum)
Try to avoid any deep knee bends and squatting for the next 2 weeks.  As long as you are not having any clicking, popping or locking after this I am okay with you resuming [name] fitness as long as he limit yourself to any type of pain and try not to overexert.   You had an injection today.  Things to be aware of after injection are listed below: . You may experience no significant improvement or even a slight worsening in your symptoms during the first 24 to 48 hours.  After that we expect your symptoms to improve gradually over the next 2 weeks for the medicine to have its maximal effect.  You should continue to have improvement out to 6 weeks after your injection. . Dr. Paulla Fore recommends icing the site of the injection for 20 minutes  1-2 times the day of your injection . You may shower but no swimming, tub bath or Jacuzzi for 24 hours. . If your bandage falls off this does not need to be replaced.  It is appropriate to remove the bandage after 4 hours. . You may resume light activities as tolerated unless otherwise directed per Dr. Paulla Fore during your visit  POSSIBLE STEROID SIDE EFFECTS:  Side effects from injectable steroids tend to be less than when taken orally however you may experience some of the symptoms listed below.  If experienced these should only last for a short period of time. Change in menstrual flow  Edema (swelling)  Increased appetite Skin flushing (redness)  Skin rash/acne  Thrush (oral) Yeast vaginitis    Increased sweating  Depression Increased blood glucose levels Cramping and leg/calf  Euphoria (feeling happy)  POSSIBLE PROCEDURE SIDE EFFECTS: The side effects of the injection are usually fairly minimal however if you may experience some of the following side effects that are usually self-limited and will is off on their own.  If you are concerned please feel free to call the office with questions:  Increased numbness or tingling  Nausea or vomiting  Swelling or  bruising at the injection site   Please call our office if if you experience any of the following symptoms over the next week as these can be signs of infection:   Fever greater than 100.64F  Significant swelling at the injection site  Significant redness or drainage from the injection site  If after 2 weeks you are continuing to have worsening symptoms please call our office to discuss what the next appropriate actions should be including the potential for a return office visit or other diagnostic testing.    Please perform the exercise program that we have prepared for you and gone over in detail on a daily basis.  In addition to the handout you were provided you can access your program through: [email or web address]-[email or web address]   Your unique program code is: [facility]   I recommend you obtained a compression sleeve to help with your joint problems. There are many options on the market however I recommend obtaining a L knee Body Helix compression sleeve.  You can find information (including how to appropriate measure yourself for sizing) can be found at www.Body http://www.lambert.com/.  Many of these products are health savings account (HSA) eligible.   You can use the compression sleeve at any time throughout the day but is most important to use while being active as well as for 2 hours post-activity.   It is appropriate to ice following activity with the compression sleeve in place.

## 2017-05-29 NOTE — Assessment & Plan Note (Signed)
Injection today Degenerative bulging appreciated of the medial and lateral meniscus Discussed avoiding hyperflexion of the knee with squatting and rowing with [name] for the next 2 weeks then okay to resume activities as tolerated as long as no persistent mechanical symptoms.  If any lack of improvement can consider further diagnostic evaluation with x-rays and MRI but suspect this is more functional in nature

## 2017-06-20 ENCOUNTER — Encounter: Payer: Self-pay | Admitting: Internal Medicine

## 2017-06-20 NOTE — Telephone Encounter (Signed)
Patient sent email stating that Dr Regis Bill out until Monday 06/26/17, aware that we will be in touch.  Gave her the option of being seen in the meantime by another provider if symptoms are severe, asked her to e-mail/call if she felt she needed to be seen.   Will send to Dr Regis Bill to advise.

## 2017-06-25 NOTE — Telephone Encounter (Signed)
I advise headache( neurology) referral  . In town is headache and wellness. Also dr Joretta Bachelor? Is in [place] .

## 2017-06-26 MED ORDER — SUMATRIPTAN SUCCINATE 100 MG PO TABS: ORAL_TABLET | ORAL | 0 refills | Status: DC

## 2017-06-26 NOTE — Telephone Encounter (Signed)
FYI

## 2017-06-27 ENCOUNTER — Encounter: Payer: Self-pay | Admitting: Internal Medicine

## 2017-06-28 NOTE — Telephone Encounter (Signed)
Patient's SUMAtriptan (IMITREX) 100 MG tablet medication was sent to the wrong pharmacy.  Please send the prescription to: [facility] out of [place], [place]. Their phone number is [phone].Their fax# [phone].The escribe #[id number].

## 2017-06-29 MED ORDER — SUMATRIPTAN SUCCINATE 100 MG PO TABS: ORAL_TABLET | ORAL | 0 refills | Status: DC

## 2017-06-29 NOTE — Telephone Encounter (Signed)
Rx corrected and sent to right pharmacy. Pt aware. Nothing further needed.

## 2017-07-14 ENCOUNTER — Ambulatory Visit (INDEPENDENT_AMBULATORY_CARE_PROVIDER_SITE_OTHER): Payer: No Typology Code available for payment source

## 2017-07-14 ENCOUNTER — Ambulatory Visit: Payer: BLUE CROSS/BLUE SHIELD | Admitting: Sports Medicine

## 2017-07-14 ENCOUNTER — Encounter: Payer: Self-pay | Admitting: Sports Medicine

## 2017-07-14 VITALS — BP 130/80 | HR 84 | Wt 196.6 lb

## 2017-07-14 DIAGNOSIS — G8929 Other chronic pain: Secondary | ICD-10-CM

## 2017-07-14 DIAGNOSIS — M25562 Pain in left knee: Secondary | ICD-10-CM | POA: Diagnosis not present

## 2017-07-14 NOTE — Assessment & Plan Note (Signed)
Recurrent effusion with worsening mechanical symptoms.  X-ray and MRI ordered at this time given improvement with conservative measures.  Avoidance of exacerbating activities recommended but okay to continue with exercise as tolerated

## 2017-07-14 NOTE — Progress Notes (Signed)
  Debra [name]. Debra [name], [place] at [place] - 52 y.o. female MRN [id number]  Date of birth: 02-28-1966  Visit Date: 07/14/2017  PCP: Burnis Medin, MD   Referred by: Burnis Medin, MD   Scribe for today's visit: Wendy Poet, LAT, ATC     SUBJECTIVE:  Debra [name] is here for Follow-up (left knee ) .   Compared to the last office visit, her previously described symptoms are improving. She is having less pain with walking  Current symptoms are mild & are nonradiating She has been doing exercises as well as injection at last visit with NSAID as needed  ROS Denies night time disturbances. Denies fevers, chills, or night sweats. Denies unexplained weight loss. Denies personal history of cancer. Denies changes in bowel or bladder habits. Denies recent unreported falls. Denies new or worsening dyspnea or wheezing. Denies headaches or dizziness.  Denies numbness, tingling or weakness  In the extremities.  Denies dizziness or presyncopal episodes Denies lower extremity edema     HISTORY & PERTINENT PRIOR DATA:  Prior History reviewed and updated per electronic medical record.  Significant/pertinent history, findings, studies include:  reports that she has never smoked. She has never used smokeless tobacco. No results for input(s): HGBA1C, LABURIC, CREATINE in the last 8760 hours. No specialty comments available. Problem  Chronic Pain of Left Knee   Injected 05/29/2017 Ultrasound with degenerative bulging of the medial and lateral meniscus, small supraphysiologic effusion Provided Body Helix     OBJECTIVE:  VS:  HT:    WT:196 lb 9.6 oz (89.2 kg)  BMI:     BP:130/80  HR:84bpm  TEMP: ( )  RESP:97 %   PHYSICAL EXAM: Constitutional: WDWN, Non-toxic appearing. Psychiatric: Alert & appropriately interactive.  Not depressed or anxious appearing. Respiratory: No increased work of breathing.   Trachea Midline Eyes: Pupils are equal.  EOM intact without nystagmus.  No scleral icterus  Vascular Exam: warm to touch no edema  lower extremity neuro exam: unremarkable  MSK Exam: Persistent medial joint line pain with mechanical symptoms.  Positive click.  Pain with Thessaly.   ASSESSMENT & PLAN:   1. Chronic pain of left knee     PLAN:   See problem based documentation.  Follow-up: Return for MRI results review.      Please see additional documentation for Objective, Assessment and Plan sections. Pertinent additional documentation may be included in corresponding procedure notes, imaging studies, problem based documentation and patient instructions. Please see these sections of the encounter for additional information regarding this visit.  CMA/ATC served as Education administrator during this visit. History, Physical, and Plan performed by medical provider. Documentation and orders reviewed and attested to.      Gerda Diss, [facility] Physician

## 2017-07-14 NOTE — Patient Instructions (Signed)

## 2017-07-19 ENCOUNTER — Encounter: Payer: Self-pay | Admitting: Sports Medicine

## 2017-07-28 ENCOUNTER — Ambulatory Visit
Admission: RE | Admit: 2017-07-28 | Discharge: 2017-07-28 | Disposition: A | Discharge status: Home / Self Care | Payer: No Typology Code available for payment source | Source: Ambulatory Visit | Attending: Sports Medicine | Admitting: Sports Medicine

## 2017-07-28 DIAGNOSIS — M25562 Pain in left knee: Principal | ICD-10-CM

## 2017-07-28 DIAGNOSIS — G8929 Other chronic pain: Secondary | ICD-10-CM

## 2017-08-01 ENCOUNTER — Encounter: Payer: Self-pay | Admitting: Sports Medicine

## 2017-08-01 ENCOUNTER — Ambulatory Visit (INDEPENDENT_AMBULATORY_CARE_PROVIDER_SITE_OTHER): Payer: No Typology Code available for payment source | Admitting: Sports Medicine

## 2017-08-01 VITALS — BP 112/82 | HR 75 | Ht 66.0 in | Wt 195.8 lb

## 2017-08-01 DIAGNOSIS — M25562 Pain in left knee: Secondary | ICD-10-CM | POA: Diagnosis not present

## 2017-08-01 DIAGNOSIS — M1712 Unilateral primary osteoarthritis, left knee: Secondary | ICD-10-CM | POA: Diagnosis not present

## 2017-08-01 DIAGNOSIS — G8929 Other chronic pain: Secondary | ICD-10-CM | POA: Diagnosis not present

## 2017-08-01 NOTE — Assessment & Plan Note (Signed)
>  50% of this 25 minute visit spent in direct patient counseling and/or coordination of care.  Discussion was focused on education regarding the in discussing the pathoetiology and anticipated clinical course of the above condition.  We discussed multiple options.  At this time she is doing significantly better however still has pain especially with squatting activities.  We discussed avoidance activities including avoiding deep knee bends and repetitive explosive movements.  Continue with compression sleeve.  We can repeat injections in in the future would consider Kenalog instead of Depo-Medrol given the slight steroid flare she had following the Depo-Medrol injection

## 2017-08-01 NOTE — Progress Notes (Signed)
Debra [name], [place] at [place] - 52 y.o. female MRN [id number]  Date of birth: 1966-02-11  Visit Date: 08/01/2017  PCP: Burnis Medin, MD   Referred by: Burnis Medin, MD   Scribe for today's visit: Josepha Pigg, CMA     SUBJECTIVE:  Debra [name] is here for Follow-up (L knee pain)  05/29/17: Her L knee pain symptoms INITIALLY: Began in Sept/Oct 2018 and was associated w/ her increase in physical activity while taking [employer] classes Described as an aching 8/10 at it's worst but has no pain at rest and is nonradiating.  Has noticed swelling in her L lateral knee. Worsened with deep knee bending exercises and ascending/descending stairs Improved with rest Additional associated symptoms include: no radiating pain and no N/T into the L LE At this time symptoms are improving compared to onset but still not great. She has been intermittently taking IBU w/ no real relief.  08/01/17: Compared to the last office visit, her previously described symptoms show no change, at times she feels like her knee is better and her leg is stronger but other days she still has pain. She had severe pain after wearing heels for 4 hours Saturday.  Current symptoms are mild-moderate & are nonradiating She has been taking IBU prn with some relief. She has received steroid injection in the past and notes that knee pain was worse for about 48 hours afterwards.    ROS Denies night time disturbances. Denies fevers, chills, or night sweats. Denies unexplained weight loss. Denies personal history of cancer. Denies changes in bowel or bladder habits. Denies recent unreported falls. Denies new or worsening dyspnea or wheezing. Denies headaches or dizziness.  Reports numbness, tingling or weakness in the extremities.  Denies dizziness or presyncopal episodes Denies lower extremity edema    HISTORY &  PERTINENT PRIOR DATA:  Prior History reviewed and updated per electronic medical record.  Significant history, findings, studies and interim changes include:  reports that  has never smoked. she has never used smokeless tobacco. No results for input(s): HGBA1C, LABURIC, CREATINE in the last 8760 hours. No specialty comments available. Problem  Primary Osteoarthritis of Left Knee   MRI left knee on 07/28/2017: Tricompartmental degenerative changes most notably in the patellofemoral compartment.  There is a 8 mm posterior cystic structure likely reflecting a ganglion cyst posterior to the medial meniscus.     OBJECTIVE:  VS:  HT:5\' 6"  (167.6 cm)   WT:195 lb 12.8 oz (88.8 kg)  BMI:31.62    BP:112/82  HR:75bpm  TEMP: ( )  RESP:98 %   PHYSICAL EXAM: Constitutional: WDWN, Non-toxic appearing. Psychiatric: Alert & appropriately interactive.  Not depressed or anxious appearing. Respiratory: No increased work of breathing.  Trachea Midline Eyes: Pupils are equal.  EOM intact without nystagmus.  No scleral icterus  NEUROVASCULAR exam: No clubbing or cyanosis appreciated No significant venous stasis changes Capillary Refill: normal, less than 2 seconds   Left knee: Overall well aligned.  Small effusion today with generalized synovitis.  She has some pain with McMurray's but this is minimal and no reproducible clicking or popping.  Posterior medial knee has a very small tender nodule but this is faint and difficult to palpate.  She has good flexion-extension without limitations.  Extensor mechanism strength intact.   ASSESSMENT & PLAN:   1. Chronic pain of left knee   2. Primary osteoarthritis of left knee  Orders & Meds: No orders of the defined types were placed in this encounter.  No orders of the defined types were placed in this encounter.   PLAN:    Primary osteoarthritis of left knee >50% of this 25 minute visit spent in direct patient counseling and/or coordination of care.   Discussion was focused on education regarding the in discussing the pathoetiology and anticipated clinical course of the above condition.  We discussed multiple options.  At this time she is doing significantly better however still has pain especially with squatting activities.  We discussed avoidance activities including avoiding deep knee bends and repetitive explosive movements.  Continue with compression sleeve.  We can repeat injections in in the future would consider Kenalog instead of Depo-Medrol given the slight steroid flare she had following the Depo-Medrol injection   Follow-up: Return if symptoms worsen or fail to improve, for as needed for ongoing issues.   Pertinent documentation may be included in additional procedure notes, imaging studies, problem based documentation and patient instructions. Please see these sections of the encounter for additional information regarding this visit. CMA/ATC served as Education administrator during this visit. History, Physical, and Plan performed by medical provider. Documentation and orders reviewed and attested to.      Gerda Diss, [facility] Physician

## 2017-08-07 ENCOUNTER — Ambulatory Visit (INDEPENDENT_AMBULATORY_CARE_PROVIDER_SITE_OTHER)
Admission: RE | Admit: 2017-08-07 | Discharge: 2017-08-07 | Disposition: A | Discharge status: Home / Self Care | Payer: No Typology Code available for payment source | Source: Ambulatory Visit | Attending: Internal Medicine | Admitting: Internal Medicine

## 2017-08-07 ENCOUNTER — Ambulatory Visit: Payer: No Typology Code available for payment source | Admitting: Internal Medicine

## 2017-08-07 ENCOUNTER — Encounter: Payer: Self-pay | Admitting: Internal Medicine

## 2017-08-07 VITALS — BP 120/78 | HR 80 | Temp 98.5°F | Wt 198.6 lb

## 2017-08-07 DIAGNOSIS — R05 Cough: Secondary | ICD-10-CM | POA: Diagnosis not present

## 2017-08-07 DIAGNOSIS — R053 Chronic cough: Secondary | ICD-10-CM

## 2017-08-07 NOTE — Patient Instructions (Signed)
Plan pulmonary  Consult.  Get chest x ray    In the interim   Stay on the reflux medication and  Try  chlorpheniramine  In the interim at night for poss PND cough .   Ear looks good today .

## 2017-08-07 NOTE — Progress Notes (Signed)
Chief Complaint  Patient presents with  . Cough    HPI: Debra [name] 52 y.o.   sda  For  recurring cough  Without total resolution since December  See past notes  Seen [facility] given steroids some better  Then low grade fevers 100 99.9   Given z pack  But  Video doc  For ear clogged   Couldn't  Hear and then pain.  And now today  Has bad taste and coughing barking.  Tan area.  No  ear  Better  Now  Some congestion     But the cough is Not going away .   Had fever 2 weeks ago .  Not today  Daughter at home had illness resolved not severe. Began back on her ppi  In case reflux but  Not helping yet   Has some sinus drainage but no pain  .  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 No sob vomiting  current cp  Remote hs of pulm nodule stable  Past Medical History:  Diagnosis Date  . Abdominal pain, epigastric 01/06/2010  . Chronic headaches   . Esophagitis   . ESOPHAGITIS, HX OF 02/18/2010  . EXTERNAL HEMORRHOIDS 02/18/2010  . FATIGUE 04/13/2007  . Gallstones   . GERD 03/13/2007  . Headache(784.0) 03/13/2007  . HIP PAIN, RIGHT 02/23/2009  . LIVER FUNCTION TESTS, ABNORMAL 01/06/2010  . LOW BACK PAIN, MILD 08/06/2008  . Pancreatitis 12/18/09  . PARESTHESIA 03/13/2007  . Pulmonary nodule    incidental on ct 2007  neg cxray 2011  . Ruptured ovarian cyst   . VARICOSE VEIN 01/06/2010    Family History  Problem Relation Age of Onset  . Alcohol abuse Father   . Cardiomyopathy Father   . Anemia Father   . Heart disease Father   . Other Father        Irrg. Heart Beat  . Lung cancer Mother   . Diabetes Mother   . Other Brother        Irr. Heart Beat  . Pancreatitis Maternal Grandfather   . Colon cancer Maternal Grandmother     Social History   Socioeconomic History  . Marital status: Married    Spouse name: None  . Number of children: 0  . Years of education: None  . Highest  education level: None  Social Needs  . Financial resource strain: None  . Food insecurity - worry: None  . Food insecurity - inability: None  . Transportation needs - medical: None  . Transportation needs - non-medical: None  Occupational History  . Occupation: PHARMACIST    Employer: [employer]  Tobacco Use  . Smoking status: Never Smoker  . Smokeless tobacco: Never Used  Substance and Sexual Activity  . Alcohol use: Yes    Comment: 2 per month  . Drug use: No  . Sexual activity: None  Other Topics Concern  . None  Social History Narrative   Dogs 2     married is a Software engineer never smoked.  Not working since [employer] from [employer]    hx fertility treatment    hh of 3   6- 8 hours  Sleep    3 diet coke per day   2 etoh per month    4 yoyear okd  adopted at 7 weeks  gracie  Building house                    Outpatient Medications Prior to Visit  Medication Sig Dispense Refill  . dexlansoprazole (DEXILANT) 60 MG capsule Take 1 capsule (60 mg total) by mouth daily. 30 capsule 2  . ibuprofen (ADVIL,MOTRIN) 200 MG tablet Take 600 mg by mouth every 6 (six) hours as needed for moderate pain.     . Multiple Vitamin (MULTI-VITAMIN PO) Take 1 tablet by mouth daily.    . SUMAtriptan (IMITREX) 100 MG tablet TAKE 1 TABLET BY MOUTH IMMEDIATELY MAY REPEAT AFTER 2 HOURS 9 tablet 0   No facility-administered medications prior to visit.      EXAM:  BP 120/78 (BP Location: Left Arm, Patient Position: Sitting, Cuff Size: Normal)   Pulse 80   Temp 98.5 F (36.9 C) (Oral)   Wt 198 lb 9.6 oz (90.1 kg)   LMP 07/28/2017 (Approximate)   SpO2 98%   BMI 32.05 kg/m   Body mass index is 32.05 kg/m.  GENERAL: vitals reviewed and listed above, alert, oriented, appears well hydrated and in no acute distress HEENT: atraumatic, conjunctiva  clear, no obvious abnormalities on inspection of external nose and ears tm NAD  Nards mild cong face non tender OP : no lesion edema or  exudate  NECK: no obvious masses on inspection palpation ? Thyroid palp no adenopathy  LUNGS: clear to auscultation bilaterally, no wheezes, rales or rhonchi, good air movement CV: HRRR, no clubbing cyanosis or  peripheral edema nl cap refill  MS: moves all extremities without noticeable focal  abnormality PSYCH: pleasant and cooperative, no obvious depression or anxiety  ASSESSMENT AND PLAN:  Discussed the following assessment and plan:  Cough, persistent - Plan: DG Chest 2 View, Ambulatory referral to Pulmonology Hx uc and video visit and  OV   Waxing and waning but never resolved    Intermittent low grade fever ?    Not now    Continue  ppi and add CTM   X ray and plan  Pulmonary referral;    Expectant management.  In the interim   -Patient advised to return or notify health care team  if symptoms worsen ,persist or new concerns arise.  Patient Instructions  Plan pulmonary  Consult.  Get chest x ray    In the interim   Stay on the reflux medication and  Try  chlorpheniramine  In the interim at night for poss PND cough .   Ear looks good today .         Standley Brooking. Panosh M.D.

## 2017-08-09 ENCOUNTER — Encounter: Payer: Self-pay | Admitting: Internal Medicine

## 2017-08-10 ENCOUNTER — Encounter: Payer: Self-pay | Admitting: Internal Medicine

## 2017-08-10 MED ORDER — HYDROCODONE-HOMATROPINE 5-1.5 MG/5ML PO SYRP: 5.0000 mL | ORAL_SOLUTION | Freq: Four times a day (QID) | ORAL | 0 refills | Status: DC | PRN

## 2017-08-10 NOTE — Telephone Encounter (Signed)
Multiple emails consolidated into one. See other email 08/10/17

## 2017-08-10 NOTE — Telephone Encounter (Signed)
Sent in   Cough med to pharmacy

## 2017-08-10 NOTE — Telephone Encounter (Signed)
  From: Deno Lunger Maranto  Sent: 08/10/2017  8:41 AM  To: [facility] Clinical Pool  Subject: Visit Follow-Up Question               ----- Message from Mychart, Generic sent at 08/10/2017 8:41 AM EDT -----    I'm coughing my head off. Can you please give me a prescription for cough?    Debra [name]    Dr. Regis Bill,    Just a follow up from my previous email. My temp is 99.5.   Just thought you'd like to know.  Thank you,  Debra [name]    Hello Dr. Regis Bill,   I'm not feeling better.  Today, my right side of my neck is hurting.  Maybe my lymph nodes are swollen.  Should I just hold out until Pulmonary calls me for an appt.?  ------  Pt having a "barking cough", some wheezing, nasal congestion and low grade temp last night. Cough sounds croupy. No mucous production, bad taste at times when she coughs.  Temp is normal today.  Pt is not scheduled yet with Pulmonary.  Pt is leaving to go to the beach tomorrow and would like something just in case she worsens.    [name] [facility]  Please advise Dr Regis Bill. OV needed?  thanks.

## 2017-08-25 ENCOUNTER — Institutional Professional Consult (permissible substitution): Payer: No Typology Code available for payment source | Admitting: Pulmonary Disease

## 2017-08-29 ENCOUNTER — Encounter: Payer: Self-pay | Admitting: Pulmonary Disease

## 2017-08-29 ENCOUNTER — Ambulatory Visit (INDEPENDENT_AMBULATORY_CARE_PROVIDER_SITE_OTHER): Payer: No Typology Code available for payment source | Admitting: Pulmonary Disease

## 2017-08-29 DIAGNOSIS — R05 Cough: Secondary | ICD-10-CM | POA: Diagnosis not present

## 2017-08-29 DIAGNOSIS — R911 Solitary pulmonary nodule: Secondary | ICD-10-CM

## 2017-08-29 DIAGNOSIS — R053 Chronic cough: Secondary | ICD-10-CM

## 2017-08-29 NOTE — Assessment & Plan Note (Signed)
We discussed causes of chronic cough including reflux  [acid + nonacid reflux], sinus drip or cough variant asthma.  Treatment trial with -Dexilant daily for 4 weeks, small frequent meals, last meal 3 hours before bedtime  Report back with percentage improvement.  If no significant improvement, then treatment trial with-  Chlorpheniramine 4-8 mg at bedtime plus decongestant/phenylephrine and daytime for 4 weeks  If no improvement after above, then will consider testing including esophagram/methacholine challenge

## 2017-08-29 NOTE — Patient Instructions (Addendum)
We discussed causes of chronic cough including reflux  [acid + nonacid reflux], sinus drip or cough variant asthma.  Treatment trial with -Dexilant daily for 4 weeks, small frequent meals, last meal 3 hours before bedtime  Report back with percentage improvement.  If no significant improvement, then treatment trial with-  Chlorpheniramine 4-8 mg at bedtime plus decongestant/phenylephrine and daytime for 4 weeks

## 2017-08-29 NOTE — Progress Notes (Signed)
Subjective:    Patient ID: Debra [name], female    DOB: February 19, 1966, 52 y.o.   MRN: [id number]  HPI  Chief Complaint  Patient presents with  . Pulm Consult    Referred by Dr. Regis Bill for chronic cough. Per patient, when she exercises or eat, the coughing gets worse. Has had the cough for at least 8 months if not longer.    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 She reports onset 12/2016 with a viral infection.  She has a 40-year-old at home.  Cough seems to be worse with exercise and after she eats, also sometimes triggered by strong odors.  This is nonproductive, no diagonal seasonal variation in fact early morning seems to be her best time. She reports mostly clearing of her throat and occasionally at its worst, deep barking cough.  This seems to cycle with 2-3 weeks of relief in between the cough never has completely gone away.  She reports occasional heartburn for which she has been on Dexilant for many years.  She has tried NyQuil Zyrtec and Benadryl over-the-counter without significant relief.  She does not like the hangover feeling in the morning. She denies having history of asthma there is no associated shortness of breath or wheezing denies seasonal allergies, or significant sinus drip or nasal drainage. Medication review Does not reveal any significant triggers  Chest x-ray reviewed which does not show any infiltrates or effusions, CT abdomen from 2015 shows normal looking bases of lungs, I personally reviewed images  Significant tests/ events reviewed  12/2004 CT angiogram 5 mm right lower lobe nodule, noted to be stable/2007   Past Medical History:  Diagnosis Date  . Abdominal pain, epigastric 01/06/2010  . Chronic headaches   . Esophagitis   . ESOPHAGITIS, HX OF 02/18/2010  . EXTERNAL HEMORRHOIDS 02/18/2010  . FATIGUE 04/13/2007  . Gallstones   . GERD 03/13/2007  . Headache(784.0) 03/13/2007  . HIP  PAIN, RIGHT 02/23/2009  . LIVER FUNCTION TESTS, ABNORMAL 01/06/2010  . LOW BACK PAIN, MILD 08/06/2008  . Pancreatitis 12/18/09  . PARESTHESIA 03/13/2007  . Pulmonary nodule    incidental on ct 2007  neg cxray 2011  . Ruptured ovarian cyst   . VARICOSE VEIN 01/06/2010   Past Surgical History:  Procedure Laterality Date  . CHOLECYSTECTOMY    . TONSILLECTOMY    . TONSILLECTOMY      Allergies  Allergen Reactions  . Gadolinium      Code: HIVES, Desc: Multihance--pt had hives, Onset Date: [id number] IVP Dye  . Penicillins     REACTION: rash  . Sulfonamide Derivatives     REACTION: rash    Social History   Socioeconomic History  . Marital status: Married    Spouse name: Not on file  . Number of children: 0  . Years of education: Not on file  . Highest education level: Not on file  Occupational History  . Occupation: PHARMACIST    Employer: [employer]  Social Needs  . Financial resource strain: Not on file  . Food insecurity:    Worry: Not on file    Inability: Not on file  . Transportation needs:    Medical: Not on file    Non-medical: Not on file  Tobacco Use  . Smoking status: Never Smoker  . Smokeless tobacco: Never Used  Substance and Sexual Activity  . Alcohol use: Yes    Comment: 2 per month  . Drug use:  No  . Sexual activity: Not on file  Lifestyle  . Physical activity:    Days per week: Not on file    Minutes per session: Not on file  . Stress: Not on file  Relationships  . Social connections:    Talks on phone: Not on file    Gets together: Not on file    Attends religious service: Not on file    Active member of club or organization: Not on file    Attends meetings of clubs or organizations: Not on file    Relationship status: Not on file  . Intimate partner violence:    Fear of current or ex partner: Not on file    Emotionally abused: Not on file    Physically abused: Not on file    Forced sexual activity: Not on file  Other Topics Concern  . Not  on file  Social History Narrative   Dogs 2     married is a Software engineer never smoked.  Not working since [employer] from [employer]    hx fertility treatment    hh of 3   6- 8 hours  Sleep    3 diet coke per day   2 etoh per month    4 yoyear okd  adopted at 7 weeks  gracie    Building house                     Family History  Problem Relation Age of Onset  . Alcohol abuse Father   . Cardiomyopathy Father   . Anemia Father   . Heart disease Father   . Other Father        Irrg. Heart Beat  . Lung cancer Mother   . Diabetes Mother   . Other Brother        Irr. Heart Beat  . Pancreatitis Maternal Grandfather   . Colon cancer Maternal Grandmother       Review of Systems   Constitutional: negative for anorexia, fevers and sweats  Eyes: negative for irritation, redness and visual disturbance  Ears, nose, mouth, throat, and face: negative for earaches, epistaxis, nasal congestion and sore throat  Respiratory: negative for  dyspnea on exertion, sputum and wheezing  Cardiovascular: negative for chest pain, dyspnea, lower extremity edema, orthopnea, palpitations and syncope  Gastrointestinal: negative for abdominal pain, constipation, diarrhea, melena, nausea and vomiting  Genitourinary:negative for dysuria, frequency and hematuria  Hematologic/lymphatic: negative for bleeding, easy bruising and lymphadenopathy  Musculoskeletal:negative for arthralgias, muscle weakness and stiff joints  Neurological: negative for coordination problems, gait problems, headaches and weakness  Endocrine: negative for diabetic symptoms including polydipsia, polyuria and weight loss     Objective:   Physical Exam  Gen. Pleasant, well-nourished, in no distress, normal affect ENT - no lesions, no post nasal drip Neck: No JVD, no thyromegaly, no carotid bruits Lungs: no use of accessory muscles, no dullness to percussion, clear without rales or rhonchi  Cardiovascular: Rhythm regular,  heart sounds  normal, no murmurs or gallops, no peripheral edema Abdomen: soft and non-tender, no hepatosplenomegaly, BS normal. Musculoskeletal: No deformities, no cyanosis or clubbing Neuro:  alert, non focal       Assessment & Plan:

## 2017-08-29 NOTE — Assessment & Plan Note (Signed)
Stable in 2007, incidental

## 2017-09-26 ENCOUNTER — Encounter: Payer: Self-pay | Admitting: Sports Medicine

## 2017-10-22 ENCOUNTER — Encounter (HOSPITAL_COMMUNITY): Payer: Self-pay | Admitting: Family Medicine

## 2017-10-22 ENCOUNTER — Ambulatory Visit (HOSPITAL_COMMUNITY)
Admission: EM | Admit: 2017-10-22 | Discharge: 2017-10-22 | Disposition: A | Discharge status: Home / Self Care | Payer: No Typology Code available for payment source | Attending: Urgent Care | Admitting: Urgent Care

## 2017-10-22 DIAGNOSIS — R110 Nausea: Secondary | ICD-10-CM

## 2017-10-22 DIAGNOSIS — G43809 Other migraine, not intractable, without status migrainosus: Secondary | ICD-10-CM

## 2017-10-22 MED ORDER — ONDANSETRON 4 MG PO TBDP
8.0000 mg | ORAL_TABLET | Freq: Once | ORAL | Status: AC
  Administered 2017-10-22: 8 mg via ORAL

## 2017-10-22 MED ORDER — BUTALBITAL-APAP-CAFFEINE 50-325-40 MG PO TABS: 1.0000 | ORAL_TABLET | Freq: Two times a day (BID) | ORAL | 0 refills | Status: DC | PRN

## 2017-10-22 MED ORDER — KETOROLAC TROMETHAMINE 60 MG/2ML IM SOLN
60.0000 mg | Freq: Once | INTRAMUSCULAR | Status: AC
  Administered 2017-10-22: 60 mg via INTRAMUSCULAR

## 2017-10-22 MED ORDER — ONDANSETRON 8 MG PO TBDP: 8.0000 mg | ORAL_TABLET | Freq: Three times a day (TID) | ORAL | 0 refills | Status: DC | PRN

## 2017-10-22 MED ORDER — KETOROLAC TROMETHAMINE 60 MG/2ML IM SOLN
INTRAMUSCULAR | Status: AC
  Filled 2017-10-22: qty 2

## 2017-10-22 MED ORDER — ONDANSETRON 4 MG PO TBDP
ORAL_TABLET | ORAL | Status: AC
  Filled 2017-10-22: qty 2

## 2017-10-22 NOTE — ED Triage Notes (Signed)
Pt here with migraine x 4 days. The first 2 days she took imitrex and it helped. The last 2 days it hasn't helped.

## 2017-10-22 NOTE — ED Provider Notes (Signed)
  MRN: [id number] DOB: Nov 01, 1965  Subjective:   Debra [name] is a 52 y.o. female presenting for 4 day history of intermittent migraine associated with her cycle. She has used Imitrex with good relief the first 2 days. Yesterday it did not help and today she has not taken it. Migraine is right sided, associated with nausea, photophobia, "head doesn't feel right". Denies fever, confusion, dizziness, numbness or tingling, weakness. Denies smoking cigarettes. Denies kidney or liver disease.   No current facility-administered medications for this encounter.   Current Outpatient Medications:  .  dexlansoprazole (DEXILANT) 60 MG capsule, Take 1 capsule (60 mg total) by mouth daily., Disp: 30 capsule, Rfl: 2 .  ibuprofen (ADVIL,MOTRIN) 200 MG tablet, Take 600 mg by mouth every 6 (six) hours as needed for moderate pain. , Disp: , Rfl:  .  Multiple Vitamin (MULTI-VITAMIN PO), Take 1 tablet by mouth daily., Disp: , Rfl:  .  SUMAtriptan (IMITREX) 100 MG tablet, TAKE 1 TABLET BY MOUTH IMMEDIATELY MAY REPEAT AFTER 2 HOURS, Disp: 9 tablet, Rfl: 0   Allergies  Allergen Reactions  . Gadolinium      Code: HIVES, Desc: Multihance--pt had hives, Onset Date: [id number] IVP Dye  . Penicillins     REACTION: rash  . Sulfonamide Derivatives     REACTION: rash    Past Medical History:  Diagnosis Date  . Abdominal pain, epigastric 01/06/2010  . Chronic headaches   . Esophagitis   . ESOPHAGITIS, HX OF 02/18/2010  . EXTERNAL HEMORRHOIDS 02/18/2010  . FATIGUE 04/13/2007  . Gallstones   . GERD 03/13/2007  . Headache(784.0) 03/13/2007  . HIP PAIN, RIGHT 02/23/2009  . LIVER FUNCTION TESTS, ABNORMAL 01/06/2010  . LOW BACK PAIN, MILD 08/06/2008  . Pancreatitis 12/18/09  . PARESTHESIA 03/13/2007  . Pulmonary nodule    incidental on ct 2007  neg cxray 2011  . Ruptured ovarian cyst   . VARICOSE VEIN 01/06/2010     Past Surgical History:  Procedure Laterality Date  . CHOLECYSTECTOMY    . TONSILLECTOMY    .  TONSILLECTOMY      Objective:   Vitals: BP 121/70   Pulse 72   Resp 18   LMP 10/13/2017   SpO2 99%   Physical Exam  Constitutional: She is oriented to person, place, and time. She appears well-developed and well-nourished.  HENT:  Mouth/Throat: Oropharynx is clear and moist.  Eyes: Pupils are equal, round, and reactive to light. EOM are normal. Right eye exhibits no discharge. Left eye exhibits no discharge.  Cardiovascular: Normal rate.  Pulmonary/Chest: Effort normal.  Neurological: She is alert and oriented to person, place, and time. She displays normal reflexes. No cranial nerve deficit. Coordination normal.  Speech intact.  Skin: Skin is warm and dry.  Psychiatric: She has a normal mood and affect.   Assessment and Plan :   Other migraine without status migrainosus, not intractable  IM Toradol and oral Zofran in clinic today.  Offered patient prescription for Fioricet to see if this does not help with her migraine since her regular Imitrex is not working.  Return to clinic precautions discussed. Counseled patient on potential for adverse effects with medications prescribed today, patient verbalized understanding.      Jaynee Eagles, PA-C 10/22/17 1414

## 2017-10-31 ENCOUNTER — Encounter: Payer: Self-pay | Admitting: Pulmonary Disease

## 2017-10-31 ENCOUNTER — Ambulatory Visit (INDEPENDENT_AMBULATORY_CARE_PROVIDER_SITE_OTHER): Payer: No Typology Code available for payment source | Admitting: Pulmonary Disease

## 2017-10-31 DIAGNOSIS — R05 Cough: Secondary | ICD-10-CM | POA: Diagnosis not present

## 2017-10-31 DIAGNOSIS — R058 Other specified cough: Secondary | ICD-10-CM

## 2017-10-31 NOTE — Assessment & Plan Note (Signed)
Responded to therapy for GERD Dexilant as needed. We discussed nonpharmacological measures including food avoidance, small meals etc  Follow-up as needed

## 2017-10-31 NOTE — Progress Notes (Signed)
   Subjective:    Patient ID: Debra [name], female    DOB: 31-Jul-1965, 52 y.o.   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 She reports onset 12/2016 with a viral infection. Cough seems to be worse with exercise and after she eats, also sometimes triggered by strong odors.    She reports occasional heartburn for which she has been on Dexilant for many years. We did a 4-week continuous course of Dexilant and that seemed to suppress her cough, cough is much improved and she is now only using it as needed. Continues to have occasional bouts of cough after exercise   Chest x-ray reviewed which does not show any infiltrates or effusions,  CT abdomen from 2015 shows normal looking bases of lungs  Significant tests/ events reviewed  12/2004 CT angiogram 5 mm right lower lobe nodule, noted to be stable/2007  Review of Systems Patient denies significant dyspnea,cough, hemoptysis,  chest pain, palpitations, pedal edema, orthopnea, paroxysmal nocturnal dyspnea, lightheadedness, nausea, vomiting, abdominal or  leg pains      Objective:   Physical Exam   Gen. Pleasant, well-nourished, in no distress ENT - no thrush, no post nasal drip Neck: No JVD, no thyromegaly, no carotid bruits Lungs: no use of accessory muscles, no dullness to percussion, clear without rales or rhonchi  Cardiovascular: Rhythm regular, heart sounds  normal, no murmurs or gallops, no peripheral edema Musculoskeletal: No deformities, no cyanosis or clubbing         Assessment & Plan:

## 2017-10-31 NOTE — Patient Instructions (Addendum)
Dexilant as needed. We discussed nonpharmacological measures including food avoidance, small meals etc  Follow-up as needed

## 2017-11-13 ENCOUNTER — Encounter: Payer: Self-pay | Admitting: Internal Medicine

## 2017-11-14 NOTE — Telephone Encounter (Signed)
Please advise Dr Regis Bill, thanks.  Pt attached a document as well

## 2017-11-22 NOTE — Telephone Encounter (Signed)
Ok to begin Topamax 25 mg 1 po hs for  1 week  Then increase to 50 mg at night    Can send in # 60 of the 25 mg  No refills  ROV in 1 months  Monitoring headache

## 2017-11-24 MED ORDER — TOPIRAMATE 25 MG PO TABS: ORAL_TABLET | ORAL | 0 refills | Status: DC

## 2017-12-19 ENCOUNTER — Encounter: Payer: Self-pay | Admitting: Internal Medicine

## 2017-12-20 MED ORDER — DEXLANSOPRAZOLE 60 MG PO CPDR: 1.0000 | DELAYED_RELEASE_CAPSULE | Freq: Every day | ORAL | 0 refills | Status: DC

## 2017-12-28 NOTE — Progress Notes (Signed)
Chief Complaint  Patient presents with  . Follow-up    after starting the topamax--doing better with this medication  . Fatigue    having bouts of fatigue for about 3 weeks.  these bouts come and go and when they are here they make it hard to get through the day.    HPI: Debra [name] 52 y.o. come in for Chronic disease med  management    Migraines    Seems to be better but only taking 25 mg topa cause of concern could get side effect     Spells of fatigue     Tired  Feel like needs to bed.    And once got dizzy .  But no sob cp fatigue and has had  Stress tests  No cough now .  Admits to some dec sleep staying g up and puppy in past awakens upat night at times  Although no problem going back to sleep  Some snoring but no osa dx.   Had  bg 82   when felt tired and thirsty   Feels drink a lot of water to lose weight  ROS: See pertinent positives and negatives per HPI. No syncope    Gi changes    Past Medical History:  Diagnosis Date  . Abdominal pain, epigastric 01/06/2010  . Chronic headaches   . Esophagitis   . ESOPHAGITIS, HX OF 02/18/2010  . EXTERNAL HEMORRHOIDS 02/18/2010  . FATIGUE 04/13/2007  . Gallstones   . GERD 03/13/2007  . Headache(784.0) 03/13/2007  . HIP PAIN, RIGHT 02/23/2009  . LIVER FUNCTION TESTS, ABNORMAL 01/06/2010  . LOW BACK PAIN, MILD 08/06/2008  . Pancreatitis 12/18/09  . PARESTHESIA 03/13/2007  . Pulmonary nodule    incidental on ct 2007  neg cxray 2011  . Ruptured ovarian cyst   . VARICOSE VEIN 01/06/2010    Family History  Problem Relation Age of Onset  . Alcohol abuse Father   . Cardiomyopathy Father   . Anemia Father   . Heart disease Father   . Other Father        Irrg. Heart Beat  . Lung cancer Mother   . Diabetes Mother   . Other Brother        Irr. Heart Beat  . Pancreatitis Maternal Grandfather   . Colon cancer Maternal Grandmother     Social History   Socioeconomic History  . Marital status: Married    Spouse name:  Not on file  . Number of children: 0  . Years of education: Not on file  . Highest education level: Not on file  Occupational History  . Occupation: PHARMACIST    Employer: [employer]  Social Needs  . Financial resource strain: Not on file  . Food insecurity:    Worry: Not on file    Inability: Not on file  . Transportation needs:    Medical: Not on file    Non-medical: Not on file  Tobacco Use  . Smoking status: Never Smoker  . Smokeless tobacco: Never Used  Substance and Sexual Activity  . Alcohol use: Yes    Comment: 2 per month  . Drug use: No  . Sexual activity: Not on file  Lifestyle  . Physical activity:    Days per week: Not on file    Minutes per session: Not on file  . Stress: Not on file  Relationships  . Social connections:    Talks on phone: Not on file    Gets  together: Not on file    Attends religious service: Not on file    Active member of club or organization: Not on file    Attends meetings of clubs or organizations: Not on file    Relationship status: Not on file  Other Topics Concern  . Not on file  Social History Narrative   Dogs 2     married is a Software engineer never smoked.  Not working since [employer] from [employer]    hx fertility treatment    hh of 3   6- 8 hours  Sleep    3 diet coke per day   2 etoh per month    4 yoyear okd  adopted at 7 weeks  gracie    Building house                    Outpatient Medications Prior to Visit  Medication Sig Dispense Refill  . dexlansoprazole (DEXILANT) 60 MG capsule Take 1 capsule (60 mg total) by mouth daily. 30 capsule 0  . ibuprofen (ADVIL,MOTRIN) 200 MG tablet Take 600 mg by mouth every 6 (six) hours as needed for moderate pain.     . Multiple Vitamin (MULTI-VITAMIN PO) Take 1 tablet by mouth daily.    . SUMAtriptan (IMITREX) 100 MG tablet TAKE 1 TABLET BY MOUTH IMMEDIATELY MAY REPEAT AFTER 2 HOURS 9 tablet 0  . topiramate (TOPAMAX) 25 MG tablet Take 1 tablet (25 mg total) by mouth at  bedtime for 7 days, THEN 2 tablets (50 mg total) at bedtime. 60 tablet 0  . butalbital-acetaminophen-caffeine (FIORICET, ESGIC) 50-325-40 MG tablet Take 1 tablet by mouth 2 (two) times daily as needed for migraine. (Patient not taking: Reported on 12/29/2017) 10 tablet 0  . ondansetron (ZOFRAN-ODT) 8 MG disintegrating tablet Take 1 tablet (8 mg total) by mouth every 8 (eight) hours as needed for nausea. (Patient not taking: Reported on 12/29/2017) 15 tablet 0   No facility-administered medications prior to visit.      EXAM:  BP 110/66 (BP Location: Right Arm, Patient Position: Sitting, Cuff Size: Normal)   Pulse 86   Temp 98.5 F (36.9 C) (Oral)   Wt 197 lb 2 oz (89.4 kg)   BMI 31.82 kg/m   Body mass index is 31.82 kg/m.  GENERAL: vitals reviewed and listed above, alert, oriented, appears well hydrated and in no acute distress HEENT: atraumatic, conjunctiva  clear, no obvious abnormalities on inspection of external nose and ears OP : no lesion edema or exudate  NECK: no obvious masses on inspection palpation  LUNGS: clear to auscultation bilaterally, no wheezes, rales or rhonchi, good air movement CV: HRRR, no clubbing cyanosis or  peripheral edema nl cap refill  Abdomen:  Sof,t normal bowel sounds without hepatosplenomegaly, no guarding rebound or masses no CVA tenderness MS: moves all extremities without noticeable focal  abnormality PSYCH: pleasant and cooperative, no obvious depression or anxiety  BP Readings from Last 3 Encounters:  12/29/17 110/66  10/31/17 114/70  10/22/17 121/70   Wt Readings from Last 3 Encounters:  12/29/17 197 lb 2 oz (89.4 kg)  10/31/17 196 lb 12.8 oz (89.3 kg)  08/29/17 193 lb (87.5 kg)     ASSESSMENT AND PLAN:  Discussed the following assessment and plan:  Other fatigue - sounds liek spells of drowsiness poss   uneffective sleep or interrupted   work on this and  lab today consider sleep  opinon if needed - Plan: CBC with Differential/Platelet,  Hemoglobin E9M, Basic metabolic panel, Hepatic function panel, Lipid panel, TSH, POCT Urinalysis Dipstick (Automated), T4, free  Sleepiness - Plan: CBC with Differential/Platelet, Hemoglobin M7W, Basic metabolic panel, Hepatic function panel, Lipid panel, TSH, POCT Urinalysis Dipstick (Automated), T4, free  Medication management - Plan: CBC with Differential/Platelet, Hemoglobin K0S, Basic metabolic panel, Hepatic function panel, Lipid panel, TSH, POCT Urinalysis Dipstick (Automated), T4, free  Urinary frequency - Plan: CBC with Differential/Platelet, Hemoglobin U1J, Basic metabolic panel, Hepatic function panel, Lipid panel, TSH, POCT Urinalysis Dipstick (Automated), T4, free  Menstrual migraine without status migrainosus, not intractable - some inproved  but on sub dose of topamax   advise trying toinc to 50 mg  if needed   ok to refill imitrex   -Patient advised to return or notify health care team  if  new concerns arise.  Patient Instructions  Fatigue seems like inadequate sleep .  Or poor sleep.   Don't thinking the medication at this time.   Track  Sleep and wakening and reasons  For this.   If   persistent or progressive can consider getting  Sleep consult .     Standley Brooking. Alyssandra Hulsebus M.D.

## 2017-12-29 ENCOUNTER — Encounter: Payer: Self-pay | Admitting: Internal Medicine

## 2017-12-29 ENCOUNTER — Ambulatory Visit (INDEPENDENT_AMBULATORY_CARE_PROVIDER_SITE_OTHER): Payer: No Typology Code available for payment source | Admitting: Internal Medicine

## 2017-12-29 VITALS — BP 110/66 | HR 86 | Temp 98.5°F | Wt 197.1 lb

## 2017-12-29 DIAGNOSIS — R350 Frequency of micturition: Secondary | ICD-10-CM | POA: Diagnosis not present

## 2017-12-29 DIAGNOSIS — Z79899 Other long term (current) drug therapy: Secondary | ICD-10-CM | POA: Diagnosis not present

## 2017-12-29 DIAGNOSIS — G43829 Menstrual migraine, not intractable, without status migrainosus: Secondary | ICD-10-CM

## 2017-12-29 DIAGNOSIS — R400 Somnolence: Secondary | ICD-10-CM | POA: Diagnosis not present

## 2017-12-29 DIAGNOSIS — R5383 Other fatigue: Secondary | ICD-10-CM

## 2017-12-29 LAB — POC URINALSYSI DIPSTICK (AUTOMATED)
Bilirubin, UA: NEGATIVE
Blood, UA: NEGATIVE
Glucose, UA: NEGATIVE
Ketones, UA: NEGATIVE
Leukocytes, UA: NEGATIVE
Nitrite, UA: NEGATIVE
Protein, UA: NEGATIVE
Spec Grav, UA: 1.02 (ref 1.010–1.025)
Urobilinogen, UA: 0.2 E.U./dL
pH, UA: 6.5 (ref 5.0–8.0)

## 2017-12-29 LAB — HEPATIC FUNCTION PANEL
ALT: 13 U/L (ref 0–35)
AST: 15 U/L (ref 0–37)
Albumin: 4.1 g/dL (ref 3.5–5.2)
Alkaline Phosphatase: 52 U/L (ref 39–117)
Bilirubin, Direct: 0.1 mg/dL (ref 0.0–0.3)
Total Bilirubin: 0.5 mg/dL (ref 0.2–1.2)
Total Protein: 7.1 g/dL (ref 6.0–8.3)

## 2017-12-29 LAB — CBC WITH DIFFERENTIAL/PLATELET
Basophils Absolute: 0.1 10*3/uL (ref 0.0–0.1)
Basophils Relative: 0.9 % (ref 0.0–3.0)
Eosinophils Absolute: 0.2 10*3/uL (ref 0.0–0.7)
Eosinophils Relative: 3 % (ref 0.0–5.0)
HCT: 41.8 % (ref 36.0–46.0)
Hemoglobin: 14.3 g/dL (ref 12.0–15.0)
Lymphocytes Relative: 28.3 % (ref 12.0–46.0)
Lymphs Abs: 2.2 10*3/uL (ref 0.7–4.0)
MCHC: 34.1 g/dL (ref 30.0–36.0)
MCV: 92.7 fl (ref 78.0–100.0)
Monocytes Absolute: 0.5 10*3/uL (ref 0.1–1.0)
Monocytes Relative: 6.6 % (ref 3.0–12.0)
Neutro Abs: 4.7 10*3/uL (ref 1.4–7.7)
Neutrophils Relative %: 61.2 % (ref 43.0–77.0)
Platelets: 285 10*3/uL (ref 150.0–400.0)
RBC: 4.51 Mil/uL (ref 3.87–5.11)
RDW: 13.5 % (ref 11.5–15.5)
WBC: 7.7 10*3/uL (ref 4.0–10.5)

## 2017-12-29 LAB — BASIC METABOLIC PANEL
BUN: 13 mg/dL (ref 6–23)
CO2: 26 mEq/L (ref 19–32)
Calcium: 9 mg/dL (ref 8.4–10.5)
Chloride: 104 mEq/L (ref 96–112)
Creatinine, Ser: 0.88 mg/dL (ref 0.40–1.20)
GFR: 71.97 mL/min (ref >60.00)
Glucose, Bld: 103 mg/dL — ABNORMAL HIGH (ref 70–99)
Potassium: 4.3 mEq/L (ref 3.5–5.1)
Sodium: 136 mEq/L (ref 135–145)

## 2017-12-29 LAB — LIPID PANEL
Cholesterol: 186 mg/dL (ref 0–200)
HDL: 54.6 mg/dL (ref >39.00)
LDL Cholesterol: 116 mg/dL — ABNORMAL HIGH (ref 0–99)
NonHDL: 131.64
Total CHOL/HDL Ratio: 3
Triglycerides: 78 mg/dL (ref 0.0–149.0)
VLDL: 15.6 mg/dL (ref 0.0–40.0)

## 2017-12-29 LAB — TSH: TSH: 1.75 u[IU]/mL (ref 0.35–4.50)

## 2017-12-29 LAB — HEMOGLOBIN A1C: Hgb A1c MFr Bld: 5.8 % (ref 4.6–6.5)

## 2017-12-29 LAB — T4, FREE: Free T4: 0.83 ng/dL (ref 0.60–1.60)

## 2017-12-29 MED ORDER — TOPIRAMATE 25 MG PO TABS: 50.0000 mg | ORAL_TABLET | Freq: Every day | ORAL | 1 refills | Status: DC

## 2017-12-29 MED ORDER — SUMATRIPTAN SUCCINATE 100 MG PO TABS: ORAL_TABLET | ORAL | 5 refills | Status: DC

## 2017-12-29 NOTE — Patient Instructions (Addendum)
Fatigue seems like inadequate sleep .  Or poor sleep.   Don't thinking the medication at this time.   Track  Sleep and wakening and reasons  For this.   If   persistent or progressive can consider getting  Sleep consult .

## 2018-01-01 NOTE — Telephone Encounter (Signed)
Will send to Dr Regis Bill as Juluis Rainier of update.   I have replied to patient with explanation of lab results.

## 2018-01-01 NOTE — Telephone Encounter (Signed)
I am not real concerned about the sugar level  As is normal for NF   But because of your symptoms jsut trying to be complete    Certainly can hold off on any testing until  Working on sleep.   Hgb A1c MFr Bld 4.6 - 6.5 % 5.8   Comment: Glycemic Control Guidelines for People with Diabetes:Non Diabetic: <6%Goal of Therapy: <7%Additional Action Suggested: >8%

## 2018-01-02 NOTE — Telephone Encounter (Signed)
Sent patient a response Nothing further needed.

## 2018-04-18 ENCOUNTER — Encounter (HOSPITAL_COMMUNITY): Payer: Self-pay | Admitting: Emergency Medicine

## 2018-04-18 ENCOUNTER — Other Ambulatory Visit: Payer: Self-pay

## 2018-04-18 ENCOUNTER — Emergency Department (HOSPITAL_COMMUNITY): Payer: No Typology Code available for payment source

## 2018-04-18 ENCOUNTER — Ambulatory Visit: Payer: Self-pay | Admitting: *Deleted

## 2018-04-18 ENCOUNTER — Emergency Department (HOSPITAL_COMMUNITY)
Admission: EM | Admit: 2018-04-18 | Discharge: 2018-04-18 | Disposition: A | Discharge status: Home / Self Care | Payer: No Typology Code available for payment source | Attending: Emergency Medicine | Admitting: Emergency Medicine

## 2018-04-18 DIAGNOSIS — Z79899 Other long term (current) drug therapy: Secondary | ICD-10-CM | POA: Insufficient documentation

## 2018-04-18 DIAGNOSIS — R102 Pelvic and perineal pain: Secondary | ICD-10-CM | POA: Diagnosis not present

## 2018-04-18 DIAGNOSIS — R1032 Left lower quadrant pain: Secondary | ICD-10-CM | POA: Diagnosis present

## 2018-04-18 DIAGNOSIS — N83202 Unspecified ovarian cyst, left side: Secondary | ICD-10-CM | POA: Diagnosis not present

## 2018-04-18 DIAGNOSIS — Z9049 Acquired absence of other specified parts of digestive tract: Secondary | ICD-10-CM | POA: Insufficient documentation

## 2018-04-18 LAB — BASIC METABOLIC PANEL
Anion gap: 7 (ref 5–15)
BUN: 10 mg/dL (ref 6–20)
CO2: 24 mmol/L (ref 22–32)
Calcium: 9 mg/dL (ref 8.9–10.3)
Chloride: 106 mmol/L (ref 98–111)
Creatinine, Ser: 0.84 mg/dL (ref 0.44–1.00)
GFR calc Af Amer: >60 mL/min (ref >60)
GFR calc non Af Amer: >60 mL/min (ref >60)
Glucose, Bld: 97 mg/dL (ref 70–99)
Potassium: 4 mmol/L (ref 3.5–5.1)
Sodium: 137 mmol/L (ref 135–145)

## 2018-04-18 LAB — CBC WITH DIFFERENTIAL/PLATELET
Abs Immature Granulocytes: 0.02 10*3/uL (ref 0.00–0.07)
Basophils Absolute: 0.1 10*3/uL (ref 0.0–0.1)
Basophils Relative: 1 %
Eosinophils Absolute: 0.2 10*3/uL (ref 0.0–0.5)
Eosinophils Relative: 2 %
HCT: 44.4 % (ref 36.0–46.0)
Hemoglobin: 14.3 g/dL (ref 12.0–15.0)
Immature Granulocytes: 0 %
Lymphocytes Relative: 28 %
Lymphs Abs: 2 10*3/uL (ref 0.7–4.0)
MCH: 30.3 pg (ref 26.0–34.0)
MCHC: 32.2 g/dL (ref 30.0–36.0)
MCV: 94.1 fL (ref 80.0–100.0)
Monocytes Absolute: 0.5 10*3/uL (ref 0.1–1.0)
Monocytes Relative: 7 %
Neutro Abs: 4.3 10*3/uL (ref 1.7–7.7)
Neutrophils Relative %: 62 %
Platelets: 287 10*3/uL (ref 150–400)
RBC: 4.72 MIL/uL (ref 3.87–5.11)
RDW: 12.2 % (ref 11.5–15.5)
WBC: 6.9 10*3/uL (ref 4.0–10.5)
nRBC: 0 % (ref 0.0–0.2)

## 2018-04-18 LAB — URINALYSIS, ROUTINE W REFLEX MICROSCOPIC
Bilirubin Urine: NEGATIVE
Glucose, UA: NEGATIVE mg/dL
Hgb urine dipstick: NEGATIVE
Ketones, ur: NEGATIVE mg/dL
Leukocytes, UA: NEGATIVE
Nitrite: NEGATIVE
Protein, ur: NEGATIVE mg/dL
Specific Gravity, Urine: 1.018 (ref 1.005–1.030)
pH: 5 (ref 5.0–8.0)

## 2018-04-18 LAB — I-STAT BETA HCG BLOOD, ED (MC, WL, AP ONLY): I-stat hCG, quantitative: <5 m[IU]/mL (ref <5)

## 2018-04-18 MED ORDER — FENTANYL CITRATE (PF) 100 MCG/2ML IJ SOLN
50.0000 ug | Freq: Once | INTRAMUSCULAR | Status: AC
Start: 2018-04-18 — End: 2018-04-18
  Administered 2018-04-18: 50 ug via INTRAVENOUS
  Filled 2018-04-18: qty 2

## 2018-04-18 MED ORDER — ONDANSETRON HCL 4 MG/2ML IJ SOLN
4.0000 mg | Freq: Once | INTRAMUSCULAR | Status: AC
  Administered 2018-04-18: 4 mg via INTRAVENOUS
  Filled 2018-04-18: qty 2

## 2018-04-18 MED ORDER — SODIUM CHLORIDE 0.9 % IV SOLN
INTRAVENOUS | Status: DC
  Administered 2018-04-18: 12:00:00 via INTRAVENOUS

## 2018-04-18 NOTE — Discharge Instructions (Addendum)
Your pain is likely secondary to an ovarian cyst with some bleeding.  There do not appear to be any other complicating features.  Get plenty of rest and drink a lot of fluids.  Use Tylenol for pain.  He will likely need a repeat ultrasound in about 6 weeks, after your next period, to reassess the cyst.

## 2018-04-18 NOTE — Telephone Encounter (Signed)
Pt has arrived [facility] ED.  

## 2018-04-18 NOTE — ED Provider Notes (Addendum)
[facility] Provider Note   CSN: [id number] Arrival date & time: 04/18/18  0920     History   Chief Complaint Chief Complaint  Patient presents with  . Groin Pain    HPI Debra [name] is a 52 y.o. female.  HPI   She noticed sudden onset of sharp pain as she awoke this morning in her left lower quadrant of her abdomen.  The pain started severely at 7/10 and has continued to be like that.  She did not eat or drink.  She does not think she is constipated.  History of ovarian cyst that ruptured.  Past Medical History:  Diagnosis Date  . Abdominal pain, epigastric 01/06/2010  . Chronic headaches   . Esophagitis   . ESOPHAGITIS, HX OF 02/18/2010  . EXTERNAL HEMORRHOIDS 02/18/2010  . FATIGUE 04/13/2007  . Gallstones   . GERD 03/13/2007  . Headache(784.0) 03/13/2007  . HIP PAIN, RIGHT 02/23/2009  . LIVER FUNCTION TESTS, ABNORMAL 01/06/2010  . LOW BACK PAIN, MILD 08/06/2008  . Pancreatitis 12/18/09  . PARESTHESIA 03/13/2007  . Pulmonary nodule    incidental on ct 2007  neg cxray 2011  . Ruptured ovarian cyst   . VARICOSE VEIN 01/06/2010    Patient Active Problem List   Diagnosis Date Noted  . Primary osteoarthritis of left knee 08/01/2017  . Chronic pain of left knee 05/29/2017  . Dyslipidemia 01/24/2017  . Upper airway cough syndrome 03/27/2014  . Protracted URI 02/28/2013  . Migraine with aura 01/10/2012  . Chronic sore throat 09/20/2011  . Neck pain, acute 03/25/2011  . Atypical chest pain 07/20/2010  . Chest pain, central 07/02/2010  . Pulmonary nodule   . EXTERNAL HEMORRHOIDS 02/18/2010  . PANCREATITIS, ACUTE 02/18/2010  . ESOPHAGITIS, HX OF 02/18/2010  . VARICOSE VEIN 01/06/2010  . CRAMP OF LIMB 01/06/2010  . ABDOMINAL PAIN, EPIGASTRIC 01/06/2010  . LIVER FUNCTION TESTS, ABNORMAL 01/06/2010  . DIARRHEA 10/14/2009  . HIP PAIN, RIGHT 02/23/2009  . BACK PAIN 02/23/2009  . LOW BACK PAIN, MILD 08/06/2008  . ABDOMINAL  BLOATING 08/06/2008  . OTHER SYMPTOMS INVOLVING DIGESTIVE SYSTEM OTHER 08/06/2008  . Chronic cough 06/03/2008  . FATIGUE 04/13/2007  . URINARY FREQUENCY 04/13/2007  . GERD 03/13/2007  . PARESTHESIA 03/13/2007  . HEADACHE 03/13/2007    Past Surgical History:  Procedure Laterality Date  . CHOLECYSTECTOMY    . TONSILLECTOMY    . TONSILLECTOMY       OB History   None      Home Medications    Prior to Admission medications   Medication Sig Start Date End Date Taking? Authorizing Provider  dexlansoprazole (DEXILANT) 60 MG capsule Take 1 capsule (60 mg total) by mouth daily. 12/20/17   Panosh, Standley Brooking, MD  ibuprofen (ADVIL,MOTRIN) 200 MG tablet Take 600 mg by mouth every 6 (six) hours as needed for moderate pain.     [provider]  Multiple Vitamin (MULTI-VITAMIN PO) Take 1 tablet by mouth daily.    [provider]  SUMAtriptan (IMITREX) 100 MG tablet TAKE 1 TABLET BY MOUTH IMMEDIATELY MAY REPEAT AFTER 2 HOURS 12/29/17   Panosh, Standley Brooking, MD  topiramate (TOPAMAX) 25 MG tablet Take 2 tablets (50 mg total) by mouth at bedtime. 12/29/17 03/29/18  Panosh, Standley Brooking, MD    Family History Family History  Problem Relation Age of Onset  . Alcohol abuse Father   . Cardiomyopathy Father   . Anemia Father   . Heart disease  Father   . Other Father        Irrg. Heart Beat  . Lung cancer Mother   . Diabetes Mother   . Other Brother        Irr. Heart Beat  . Pancreatitis Maternal Grandfather   . Colon cancer Maternal Grandmother     Social History Social History   Tobacco Use  . Smoking status: Never Smoker  . Smokeless tobacco: Never Used  Substance Use Topics  . Alcohol use: Yes    Comment: 2 per month  . Drug use: No     Allergies   Gadolinium; Penicillins; and Sulfonamide derivatives   Review of Systems Review of Systems  All other systems reviewed and are negative.    Physical Exam Updated Vital Signs BP 114/63 (BP Location: Left Arm)   Pulse 63    Temp 98.5 F (36.9 C) (Oral)   Resp 18   Ht 5\' 8"  (1.727 m)   Wt 85.7 kg   LMP 04/04/2018   SpO2 100%   BMI 28.74 kg/m   Physical Exam Vitals signs and nursing note reviewed.  Constitutional:      General: She is not in acute distress.    Appearance: She is well-developed. She is not ill-appearing or diaphoretic.  HENT:     Head: Normocephalic and atraumatic.  Eyes:     Conjunctiva/sclera: Conjunctivae normal.     Pupils: Pupils are equal, round, and reactive to light.  Neck:     Musculoskeletal: Normal range of motion and neck supple.     Trachea: Phonation normal.  Cardiovascular:     Rate and Rhythm: Normal rate and regular rhythm.  Pulmonary:     Effort: Pulmonary effort is normal.     Breath sounds: Normal breath sounds.  Chest:     Chest wall: No tenderness.  Abdominal:     General: There is no distension.     Palpations: Abdomen is soft. There is no mass.     Tenderness: There is abdominal tenderness (LLQ, mild.). There is no guarding or rebound.     Hernia: No hernia is present.  Musculoskeletal: Normal range of motion.  Skin:    General: Skin is warm and dry.  Neurological:     Mental Status: She is alert and oriented to person, place, and time.     Motor: No abnormal muscle tone.  Psychiatric:        Behavior: Behavior normal.        Thought Content: Thought content normal.        Judgment: Judgment normal.      ED Treatments / Results  Labs (all labs ordered are listed, but only abnormal results are displayed) Labs Reviewed  URINALYSIS, ROUTINE W REFLEX MICROSCOPIC - Abnormal; Notable for the following components:      Result Value   APPearance HAZY (*)    All other components within normal limits  CBC WITH DIFFERENTIAL/PLATELET  BASIC METABOLIC PANEL  I-STAT BETA HCG BLOOD, ED ([facility], [facility], [facility] ONLY)    EKG None  Radiology US Transvaginal Non-ob  Result Date: 04/18/2018 CLINICAL DATA:  Left lower quadrant pain EXAM: TRANSABDOMINAL AND  TRANSVAGINAL ULTRASOUND OF PELVIS DOPPLER ULTRASOUND OF OVARIES TECHNIQUE: Both transabdominal and transvaginal ultrasound examinations of the pelvis were performed. Transabdominal technique was performed for global imaging of the pelvis including uterus, ovaries, adnexal regions, and pelvic cul-de-sac. It was necessary to proceed with endovaginal exam following the transabdominal exam to visualize the uterus, endometrium and  ovaries. Color and duplex Doppler ultrasound was utilized to evaluate blood flow to the ovaries. COMPARISON:  02/28/2014 FINDINGS: Uterus Measurements: 7.8 x 4.6 x 6.6 cm = volume: 120.3 mL. Anterior uterine fibroid is identified measuring 2.5 x 2.9 x 2.9 cm. Fibroid within the fundus measures 1.9 by 2.3 x 2.2 cm. Endometrium Thickness: 5.2 mm.  No focal abnormality visualized. Right ovary Measurements: 2.1 x 1.6 x 1.5 cm = volume: 2.6 mL. Normal appearance/no adnexal mass. Left ovary Measurements: 4.4 x 3.3 x 3.1 cm = volume: 24.1 mL. There is a complex cyst within internal lace like architecture measuring 3.3 x 3.3 x 3.2 cm. Pulsed Doppler evaluation of both ovaries demonstrates normal low-resistance arterial and venous waveforms. Other findings Small IMPRESSION: 1. No evidence for ovarian torsion. 2. Complex cyst within the left ovary is identified with a maximum diameter of 3.3 cm. This has a internal lace-like architecture and is favored to represent a hemorrhagic cyst. Short-interval follow up ultrasound in 6-12 weeks is recommended, preferably during the week following the patient's normal menses. 3. Uterine fibroids. Electronically Signed   By: Kerby Moors M.D.   On: 04/18/2018 12:30   US Pelvis Complete  Result Date: 04/18/2018 CLINICAL DATA:  Left lower quadrant pain EXAM: TRANSABDOMINAL AND TRANSVAGINAL ULTRASOUND OF PELVIS DOPPLER ULTRASOUND OF OVARIES TECHNIQUE: Both transabdominal and transvaginal ultrasound examinations of the pelvis were performed. Transabdominal  technique was performed for global imaging of the pelvis including uterus, ovaries, adnexal regions, and pelvic cul-de-sac. It was necessary to proceed with endovaginal exam following the transabdominal exam to visualize the uterus, endometrium and ovaries. Color and duplex Doppler ultrasound was utilized to evaluate blood flow to the ovaries. COMPARISON:  02/28/2014 FINDINGS: Uterus Measurements: 7.8 x 4.6 x 6.6 cm = volume: 120.3 mL. Anterior uterine fibroid is identified measuring 2.5 x 2.9 x 2.9 cm. Fibroid within the fundus measures 1.9 by 2.3 x 2.2 cm. Endometrium Thickness: 5.2 mm.  No focal abnormality visualized. Right ovary Measurements: 2.1 x 1.6 x 1.5 cm = volume: 2.6 mL. Normal appearance/no adnexal mass. Left ovary Measurements: 4.4 x 3.3 x 3.1 cm = volume: 24.1 mL. There is a complex cyst within internal lace like architecture measuring 3.3 x 3.3 x 3.2 cm. Pulsed Doppler evaluation of both ovaries demonstrates normal low-resistance arterial and venous waveforms. Other findings Small IMPRESSION: 1. No evidence for ovarian torsion. 2. Complex cyst within the left ovary is identified with a maximum diameter of 3.3 cm. This has a internal lace-like architecture and is favored to represent a hemorrhagic cyst. Short-interval follow up ultrasound in 6-12 weeks is recommended, preferably during the week following the patient's normal menses. 3. Uterine fibroids. Electronically Signed   By: Kerby Moors M.D.   On: 04/18/2018 12:30   [place] Art/ven Flow Abd Pelv Doppler  Result Date: 04/18/2018 CLINICAL DATA:  Left lower quadrant pain EXAM: TRANSABDOMINAL AND TRANSVAGINAL ULTRASOUND OF PELVIS DOPPLER ULTRASOUND OF OVARIES TECHNIQUE: Both transabdominal and transvaginal ultrasound examinations of the pelvis were performed. Transabdominal technique was performed for global imaging of the pelvis including uterus, ovaries, adnexal regions, and pelvic cul-de-sac. It was necessary to proceed with endovaginal exam  following the transabdominal exam to visualize the uterus, endometrium and ovaries. Color and duplex Doppler ultrasound was utilized to evaluate blood flow to the ovaries. COMPARISON:  02/28/2014 FINDINGS: Uterus Measurements: 7.8 x 4.6 x 6.6 cm = volume: 120.3 mL. Anterior uterine fibroid is identified measuring 2.5 x 2.9 x 2.9 cm. Fibroid within the fundus measures 1.9  by 2.3 x 2.2 cm. Endometrium Thickness: 5.2 mm.  No focal abnormality visualized. Right ovary Measurements: 2.1 x 1.6 x 1.5 cm = volume: 2.6 mL. Normal appearance/no adnexal mass. Left ovary Measurements: 4.4 x 3.3 x 3.1 cm = volume: 24.1 mL. There is a complex cyst within internal lace like architecture measuring 3.3 x 3.3 x 3.2 cm. Pulsed Doppler evaluation of both ovaries demonstrates normal low-resistance arterial and venous waveforms. Other findings Small IMPRESSION: 1. No evidence for ovarian torsion. 2. Complex cyst within the left ovary is identified with a maximum diameter of 3.3 cm. This has a internal lace-like architecture and is favored to represent a hemorrhagic cyst. Short-interval follow up ultrasound in 6-12 weeks is recommended, preferably during the week following the patient's normal menses. 3. Uterine fibroids. Electronically Signed   By: Kerby Moors M.D.   On: 04/18/2018 12:30    Procedures Procedures (including critical care time)  Medications Ordered in ED Medications  fentaNYL (SUBLIMAZE) injection 50 mcg (50 mcg Intravenous Given 04/18/18 1148)  ondansetron (ZOFRAN) injection 4 mg (4 mg Intravenous Given 04/18/18 1148)     Initial Impression / Assessment and Plan / ED Course  I have reviewed the triage vital signs and the nursing notes.  Pertinent labs & imaging results that were available during my care of the patient were reviewed by me and considered in my medical decision making (see chart for details).  Clinical Course as of Apr 18 2040  Wed Apr 18, 2018  2040 Normal  I-Stat beta hCG blood, ED  [EW]  2040 Normal  Urinalysis, Routine w reflex microscopic(!) [EW]  2040 Normal  Basic metabolic panel [EW]  7829 Normal  CBC with Differential [EW]  2041 Ultrasound imaging does not indicate ovarian torsion.  There is a left ovarian cyst, with hemorrhagic component, likely source of pain.  US Pelvis Complete [EW]    Clinical Course User Index [EW] Daleen Bo, MD     Patient Vitals for the past 24 hrs:  BP Temp Temp src Pulse Resp SpO2 Height Weight  04/18/18 1501 114/63 98.5 F (36.9 C) Oral 63 18 100 % - -  04/18/18 1430 111/64 - - 64 18 100 % - -  04/18/18 0944 114/83 - - 75 18 100 % - -  04/18/18 0940 - - - - - - 5\' 8"  (1.727 m) 85.7 kg  04/18/18 0938 - 98.9 F (37.2 C) Oral - - - - -    At discharge- reevaluation with update and discussion. After initial assessment and treatment, an updated evaluation reveals abdominal/pelvic discomfort resolved.  No further symptoms.  Findings discussed with the patient and all questions were answered. Daleen Bo   Medical Decision Making: Evaluation consistent with pelvic pain from ovarian cyst, likely hemorrhagic component.  Doubt acute infectious process.  Doubt impending vascular collapse.  Doubt acute intestinal disorder.  CRITICAL CARE-no Performed by: Daleen Bo  Nursing Notes Reviewed/ Care Coordinated Applicable Imaging Reviewed Interpretation of Laboratory Data incorporated into ED treatment  The patient appears reasonably screened and/or stabilized for discharge and I doubt any other medical condition or other [facility] requiring further screening, evaluation, or treatment in the ED at this time prior to discharge.  Patient has incidental uterine fibroids, she understands these are present.  Plan: Home Medications-routine OTC analgesia; Home Treatments-rest, gradually advance activity; return here if the recommended treatment, does not improve the symptoms; Recommended follow up-GYN follow-up 1 week and as  needed   Final Clinical Impressions(s) / ED  Diagnoses   Final diagnoses:  Hemorrhagic cyst of left ovary    ED Discharge Orders    None       Daleen Bo, MD 04/18/18 2043    Daleen Bo, MD 05/08/18 1218

## 2018-04-18 NOTE — Telephone Encounter (Signed)
Calling with severe lower left abdominal pain since 7am this morning. The pain is constant and shoots towards her thigh intermittently. She had voiding this morning without any pain/urgency/frequency/blood noted. Denies fever/injury/n/v. LBM yesterday was normal. No new or unusual foods yesterday. Felt fine before going to bed last night and at waking up at 4am this morning. Advised NPO and be seen at the nearest emergency room-have someone drive her if possible. She will go to [facility] ED now. Reason for Disposition . [1] SEVERE pain (e.g., excruciating) AND [2] present > 1 hour  Answer Assessment - Initial Assessment Questions 1. LOCATION: "Where does it hurt?"      Left lower abdominal pain. 2. RADIATION: "Does the pain shoot anywhere else?" (e.g., chest, back)     Pain shoots to thigh area 3. ONSET: "When did the pain begin?" (e.g., minutes, hours or days ago)      Started 7am this morning 4. SUDDEN: "Gradual or sudden onset?"     gradual 5. PATTERN "Does the pain come and go, or is it constant?"    - If constant: "Is it getting better, staying the same, or worsening?"      (Note: Constant means the pain never goes away completely; most serious pain is constant and it progresses)     - If intermittent: "How long does it last?" "Do you have pain now?"     (Note: Intermittent means the pain goes away completely between bouts)     constant 6. SEVERITY: "How bad is the pain?"  (e.g., Scale 1-10; mild, moderate, or severe)   - MILD (1-3): doesn't interfere with normal activities, abdomen soft and not tender to touch    - MODERATE (4-7): interferes with normal activities or awakens from sleep, tender to touch    - SEVERE (8-10): excruciating pain, doubled over, unable to do any normal activities      7 on the scale 7. RECURRENT SYMPTOM: "Have you ever had this type of abdominal pain before?" If so, ask: "When was the last time?" and "What happened that time?"      Years ago a cyst ruptured. 8.  CAUSE: "What do you think is causing the abdominal pain?"     unsure 9. RELIEVING/AGGRAVATING FACTORS: "What makes it better or worse?" (e.g., movement, antacids, bowel movement)     Nothing, same constant pain. 10. OTHER SYMPTOMS: "Has there been any vomiting, diarrhea, constipation, or urine problems?"       LBM yesterday-normal 11. PREGNANCY: "Is there any chance you are pregnant?" "When was your last menstrual period?"       no  Protocols used: ABDOMINAL PAIN - [facility]

## 2018-04-18 NOTE — ED Triage Notes (Signed)
Per Pt she was at home and states feeling fine this morning when she got up at 430 to let her dogs out.  However, when she got up at 0700 this morning she reports left lower abdomen 7/10.  NAD noted at this time AOx4 pt has history of ovarian cyst

## 2018-09-03 ENCOUNTER — Other Ambulatory Visit: Payer: Self-pay | Admitting: Internal Medicine

## 2018-09-25 ENCOUNTER — Telehealth: Payer: Self-pay

## 2018-09-25 NOTE — Telephone Encounter (Signed)
PA for Dexilant has been sent to cover my meds.  Key: [id number] - Rx #: [id number]

## 2018-09-27 NOTE — Telephone Encounter (Signed)
PA has been approved through 09/22/21.

## 2018-09-28 DIAGNOSIS — N926 Irregular menstruation, unspecified: Secondary | ICD-10-CM | POA: Diagnosis not present

## 2018-10-03 DIAGNOSIS — D251 Intramural leiomyoma of uterus: Secondary | ICD-10-CM | POA: Diagnosis not present

## 2018-10-03 DIAGNOSIS — N939 Abnormal uterine and vaginal bleeding, unspecified: Secondary | ICD-10-CM | POA: Diagnosis not present

## 2018-10-08 ENCOUNTER — Telehealth: Payer: Self-pay | Admitting: *Deleted

## 2018-10-08 NOTE — Telephone Encounter (Signed)
Spoke with patient, explained she needs recall colonoscopy with Dr.Danis. last colon 2010. Patient states she is going to have polyps removed from uterus on June 18, 20. Pt will call us back to schedule the colonoscopy once she is cleared by her surgeon.

## 2018-10-11 DIAGNOSIS — Z6830 Body mass index (BMI) 30.0-30.9, adult: Secondary | ICD-10-CM | POA: Diagnosis not present

## 2018-10-11 DIAGNOSIS — Z1231 Encounter for screening mammogram for malignant neoplasm of breast: Secondary | ICD-10-CM | POA: Diagnosis not present

## 2018-10-11 DIAGNOSIS — Z01419 Encounter for gynecological examination (general) (routine) without abnormal findings: Secondary | ICD-10-CM | POA: Diagnosis not present

## 2018-10-18 NOTE — Progress Notes (Signed)
Virtual Visit via Video Note  I connected with@ on 10/19/18 at  9:30 AM EDT by a video enabled telemedicine application and verified that I am speaking with the correct person using two identifiers. Location patient: home Location provider:workoffice Persons participating in the virtual visit: patient, provider  WIth national recommendations  regarding COVID 19 pandemic   video visit is advised over in office visit for this patient.  Patient aware  of the limitations of evaluation and management by telemedicine and  availability of in person appointments. and agreed to proceed.   HPI: Debra [name] presents for video visit  Having a week or so of under rib cage discomfort with out go change although does have bloating and on gerd  Med for  Poss relfux related cough in interim .  Has left upper back shoulder area pain for a long time felt to be MS but may be worse  With this  Was given phenteroine to try by her gyne 37.5 capsule and noted inc in under rib cage pain   So stopped after 4 doses    Asks opinon could it be CV cause > No sob  Uses [place]   And no change in exercise tolerance or chest abd pain  But has noted some cough after   A while with exercise  recently   She has been evaluated for cough in  Past but this is different .   Having endometrial polys removed  June 18th   Had  Hysterogram about 10 days ago?  But no abd pain  After that   Has known ovarian cyst but no acute sx  From eval  Has fibroids    [place] protected exposure  s ROS: See pertinent positives and negatives per HPI. ocass hot flushes hard to lose weight  fsh has been nl  [place] in record  Complex opvarian cyst 3 cm frimm 11 19 left   An fibroids  Advised short interval FU Last c xray was 3 19 Due for colonoscopy  Past Medical History:  Diagnosis Date  . Abdominal pain, epigastric 01/06/2010  . Chronic headaches   . Esophagitis   . ESOPHAGITIS, HX OF 02/18/2010  . EXTERNAL HEMORRHOIDS  02/18/2010  . FATIGUE 04/13/2007  . Gallstones   . GERD 03/13/2007  . Headache(784.0) 03/13/2007  . HIP PAIN, RIGHT 02/23/2009  . LIVER FUNCTION TESTS, ABNORMAL 01/06/2010  . LOW BACK PAIN, MILD 08/06/2008  . Pancreatitis 12/18/09  . PARESTHESIA 03/13/2007  . Pulmonary nodule    incidental on ct 2007  neg cxray 2011  . Ruptured ovarian cyst   . VARICOSE VEIN 01/06/2010    Past Surgical History:  Procedure Laterality Date  . CHOLECYSTECTOMY    . TONSILLECTOMY    . TONSILLECTOMY      Family History  Problem Relation Age of Onset  . Alcohol abuse Father   . Cardiomyopathy Father   . Anemia Father   . Heart disease Father   . Other Father        Irrg. Heart Beat  . Lung cancer Mother   . Diabetes Mother   . Other Brother        Irr. Heart Beat  . Pancreatitis Maternal Grandfather   . Colon cancer Maternal Grandmother     Social History   Tobacco Use  . Smoking status: Never Smoker  . Smokeless tobacco: Never Used  Substance Use Topics  . Alcohol use: Yes    Comment:  2 per month  . Drug use: No      Current Outpatient Medications:  .  DEXILANT 60 MG capsule, TAKE 1 CAPSULE(60 MG) BY MOUTH DAILY, Disp: 30 capsule, Rfl: 0 .  ibuprofen (ADVIL,MOTRIN) 200 MG tablet, Take 600 mg by mouth every 6 (six) hours as needed for moderate pain. , Disp: , Rfl:  .  Multiple Vitamin (MULTI-VITAMIN PO), Take 1 tablet by mouth daily., Disp: , Rfl:  .  SUMAtriptan (IMITREX) 100 MG tablet, TAKE 1 TABLET BY MOUTH IMMEDIATELY MAY REPEAT AFTER 2 HOURS, Disp: 9 tablet, Rfl: 5 .  topiramate (TOPAMAX) 25 MG tablet, Take 2 tablets (50 mg total) by mouth at bedtime., Disp: 180 tablet, Rfl: 1  EXAM: BP Readings from Last 3 Encounters:  04/18/18 114/63  12/29/17 110/66  10/31/17 114/70   Wt Readings from Last 3 Encounters:  04/18/18 189 lb (85.7 kg)  12/29/17 197 lb 2 oz (89.4 kg)  10/31/17 196 lb 12.8 oz (89.3 kg)    VITALS per patient if applicable:  GENERAL: alert, oriented, appears  well and in no acute distress  HEENT: atraumatic, conjunttiva clear, no obvious abnormalities on inspection of external nose and ears  NECK: normal movements of the head and neck  LUNGS: on inspection no signs of respiratory distress, breathing rate appears normal, no obvious gross SOB, gasping or wheezing  CV: no obvious cyanosis  PSYCH/NEURO: pleasant and cooperative, no obvious depression or anxiety, speech and thought processing grossly intact Lab Results  Component Value Date   WBC 6.9 04/18/2018   HGB 14.3 04/18/2018   HCT 44.4 04/18/2018   PLT 287 04/18/2018   GLUCOSE 97 04/18/2018   CHOL 186 12/29/2017   TRIG 78.0 12/29/2017   HDL 54.60 12/29/2017   LDLCALC 116 (H) 12/29/2017   ALT 13 12/29/2017   AST 15 12/29/2017   NA 137 04/18/2018   K 4.0 04/18/2018   CL 106 04/18/2018   CREATININE 0.84 04/18/2018   BUN 10 04/18/2018   CO2 24 04/18/2018   TSH 1.75 12/29/2017   HGBA1C 5.8 12/29/2017    ASSESSMENT AND PLAN:  Discussed the following assessment and plan:  Chest discomfort - Plan: DG Chest 2 View  Intermittent left upper quadrant abdominal  pain  under rib cage  - Plan: DG Chest 2 View  Cough on exercise - Plan: DG Chest 2 View  Endometrial polyp Does not sound cardiac per se but advise to not take the phentermine .  Sounds like could be diaphragmatic    ? Rad to shoulder but shoulder sx predate and is chronic  Had hysterogram done pre sx  But no acute pains. Has hx of gis buyt this  Is different  And due for colon. Exercise induced cough may be unrelated but  Has had gerd cough in past    Plan get   chest x ray  Today to r/o surprise findings and  Stay off  Phentermine and observe and fu  In 2 weeks my chart  Status how doing etc  Counseled.   Expectant management and discussion of plan and treatment with opportunity to ask questions and all were answered. The patient agreed with the plan and demonstrated an understanding of the instructions.  Advised to  call back or seek an in-person evaluation if worsening  or having  further concerns . Otherwise  Check back in in 2 weeks about how doing       Shanon Ace, MD

## 2018-10-19 ENCOUNTER — Ambulatory Visit (INDEPENDENT_AMBULATORY_CARE_PROVIDER_SITE_OTHER): Payer: BLUE CROSS/BLUE SHIELD | Admitting: Internal Medicine

## 2018-10-19 ENCOUNTER — Other Ambulatory Visit: Payer: Self-pay

## 2018-10-19 ENCOUNTER — Ambulatory Visit (INDEPENDENT_AMBULATORY_CARE_PROVIDER_SITE_OTHER): Payer: BLUE CROSS/BLUE SHIELD

## 2018-10-19 ENCOUNTER — Encounter: Payer: Self-pay | Admitting: Internal Medicine

## 2018-10-19 DIAGNOSIS — R058 Other specified cough: Secondary | ICD-10-CM

## 2018-10-19 DIAGNOSIS — R1012 Left upper quadrant pain: Secondary | ICD-10-CM

## 2018-10-19 DIAGNOSIS — R0789 Other chest pain: Secondary | ICD-10-CM | POA: Diagnosis not present

## 2018-10-19 DIAGNOSIS — R05 Cough: Secondary | ICD-10-CM

## 2018-10-19 DIAGNOSIS — N840 Polyp of corpus uteri: Secondary | ICD-10-CM | POA: Diagnosis not present

## 2018-11-05 NOTE — Telephone Encounter (Signed)
I t hink its reasonable to try the probiotic   . (recently the national  Org of GI docs  Make a statement that probiotics  Are not helpful except under certain situations.  ) but if you want to try  In the short run  Its worth a try.

## 2018-11-08 ENCOUNTER — Other Ambulatory Visit: Payer: Self-pay | Admitting: Obstetrics & Gynecology

## 2018-11-08 DIAGNOSIS — N950 Postmenopausal bleeding: Secondary | ICD-10-CM | POA: Diagnosis not present

## 2018-11-08 DIAGNOSIS — N858 Other specified noninflammatory disorders of uterus: Secondary | ICD-10-CM | POA: Diagnosis not present

## 2018-11-08 DIAGNOSIS — N840 Polyp of corpus uteri: Secondary | ICD-10-CM | POA: Diagnosis not present

## 2018-11-14 DIAGNOSIS — D509 Iron deficiency anemia, unspecified: Secondary | ICD-10-CM | POA: Diagnosis not present

## 2018-11-22 ENCOUNTER — Other Ambulatory Visit: Payer: Self-pay

## 2018-11-22 ENCOUNTER — Ambulatory Visit (AMBULATORY_SURGERY_CENTER): Payer: Self-pay

## 2018-11-22 VITALS — Ht 68.0 in | Wt 195.0 lb

## 2018-11-22 DIAGNOSIS — Z1211 Encounter for screening for malignant neoplasm of colon: Secondary | ICD-10-CM

## 2018-11-22 MED ORDER — NA SULFATE-K SULFATE-MG SULF 17.5-3.13-1.6 GM/177ML PO SOLN: 1.0000 | Freq: Once | ORAL | 0 refills | Status: AC

## 2018-11-22 NOTE — Progress Notes (Signed)
Denies allergies to eggs or soy products. Denies complication of anesthesia or sedation. Denies use of weight loss medication. Denies use of O2.   Emmi instructions given for colonoscopy.   Pre-Visit was conducted by phone due to Covid 19. Instructions were reviewed and mailed to patients confirmed home address. A 15.00 coupon for Suprep was given to the patient. Patient was encouraged to call if she had questions regarding instructions. 

## 2018-11-25 ENCOUNTER — Other Ambulatory Visit: Payer: Self-pay | Admitting: Internal Medicine

## 2018-11-26 NOTE — Telephone Encounter (Signed)
Please schedule virtual visit to discuss.

## 2018-11-30 ENCOUNTER — Other Ambulatory Visit: Payer: Self-pay

## 2018-11-30 ENCOUNTER — Ambulatory Visit (INDEPENDENT_AMBULATORY_CARE_PROVIDER_SITE_OTHER): Payer: BC Managed Care – PPO | Admitting: Internal Medicine

## 2018-11-30 ENCOUNTER — Encounter: Payer: Self-pay | Admitting: Internal Medicine

## 2018-11-30 DIAGNOSIS — R21 Rash and other nonspecific skin eruption: Secondary | ICD-10-CM | POA: Diagnosis not present

## 2018-11-30 MED ORDER — CLINDAMYCIN PHOSPHATE 1 % EX GEL: Freq: Two times a day (BID) | CUTANEOUS | 0 refills | Status: DC

## 2018-11-30 NOTE — Progress Notes (Signed)
Virtual Visit via Video Note  I connected with@ on 11/30/18 at  2:30 PM EDT by a video enabled telemedicine application and verified that I am speaking with the correct person using two identifiers. Location patient: home Location provider:work  office Persons participating in the virtual visit: patient, provider  [place] national recommendations  regarding COVID 19 pandemic   video visit is advised over in office visit for this patient.  Patient aware  of the limitations of evaluation and management by telemedicine and  availability of in person appointments. and agreed to proceed.   HPI: Debra [name] presents for video visit  Getting wax and wane  Mid chest rash worse after exercising sweating on her [place] .  Itches mildly no recent changes.   No rx   ROS: See pertinent positives and negatives per HPI.  Past Medical History:  Diagnosis Date  . Abdominal pain, epigastric 01/06/2010  . Chronic headaches   . Esophagitis   . ESOPHAGITIS, HX OF 02/18/2010  . EXTERNAL HEMORRHOIDS 02/18/2010  . FATIGUE 04/13/2007  . Gallstones   . GERD 03/13/2007  . Headache(784.0) 03/13/2007  . HIP PAIN, RIGHT 02/23/2009  . LIVER FUNCTION TESTS, ABNORMAL 01/06/2010  . LOW BACK PAIN, MILD 08/06/2008  . Pancreatitis 12/18/09  . PARESTHESIA 03/13/2007  . Pulmonary nodule    incidental on ct 2007  neg cxray 2011  . Ruptured ovarian cyst   . VARICOSE VEIN 01/06/2010    Past Surgical History:  Procedure Laterality Date  . CHOLECYSTECTOMY    . TONSILLECTOMY    . TONSILLECTOMY    . Uterine Polyps      Family History  Problem Relation Age of Onset  . Alcohol abuse Father   . Cardiomyopathy Father   . Anemia Father   . Heart disease Father   . Other Father        Irrg. Heart Beat  . Lung cancer Mother   . Diabetes Mother   . Other Brother        Irr. Heart Beat  . Pancreatitis Maternal Grandfather   . Colon cancer Maternal Grandmother   . Esophageal cancer Neg Hx   . Rectal cancer Neg Hx    . Stomach cancer Neg Hx     Social History   Tobacco Use  . Smoking status: Never Smoker  . Smokeless tobacco: Never Used  Substance Use Topics  . Alcohol use: Yes    Comment: 2 per month  . Drug use: No      Current Outpatient Medications:  .  clindamycin (CLINDAGEL) 1 % gel, Apply topically 2 (two) times daily. To skin rash, Disp: 30 g, Rfl: 0 .  DEXILANT 60 MG capsule, TAKE 1 CAPSULE(60 MG) BY MOUTH DAILY, Disp: 30 capsule, Rfl: 0 .  ibuprofen (ADVIL,MOTRIN) 200 MG tablet, Take 600 mg by mouth every 6 (six) hours as needed for moderate pain. , Disp: , Rfl:  .  Multiple Vitamin (MULTI-VITAMIN PO), Take 1 tablet by mouth daily., Disp: , Rfl:  .  SUMAtriptan (IMITREX) 100 MG tablet, TAKE 1 TABLET BY MOUTH IMMEDIATELY. MAY REPEAT AFTER 2 HOURS, Disp: 9 tablet, Rfl: 5 .  topiramate (TOPAMAX) 25 MG tablet, Take 2 tablets (50 mg total) by mouth at bedtime., Disp: 180 tablet, Rfl: 1  EXAM: BP Readings from Last 3 Encounters:  04/18/18 114/63  12/29/17 110/66  10/31/17 114/70    VITALS per patient if applicable: GENERAL: alert, oriented, appears well and in no acute distress HEENT: atraumatic, conjunttiva clear,  no obvious abnormalities on inspection of external nose and ears NECK: normal movements of the head and neck LUNGS: on inspection no signs of respiratory distress, breathing rate appears normal, no obvious gross SOB, gasping or wheezing CV: no obvious cyanosis Skin see images on my chart and ant chest   No vesicle  Pinpoint redness   MS: moves all visible extremities without noticeable abnormality  PSYCH/NEURO: pleasant and cooperative, no obvious depression or anxiety, speech and thought processing grossly intact  ASSESSMENT AND PLAN:  Discussed the following assessment and plan:    ICD-10-CM   1. Rash  R21    atypical for miliaria but poss with heat and sweat add rx for folliculitis   keep dry   Heat related but poss folliculitis    Based on hx and pix  Add  topical clindamycin and can add hcs  Keep dry   And follow    Counseled.   Expectant management and discussion of plan and treatment with opportunity to ask questions and all were answered. The patient agreed with the plan and demonstrated an understanding of the instructions.   Advised to call back or seek an in-person evaluation if worsening  or having  further concerns .   Shanon Ace, MD

## 2018-12-06 ENCOUNTER — Telehealth: Payer: Self-pay | Admitting: Gastroenterology

## 2018-12-06 NOTE — Telephone Encounter (Signed)

## 2018-12-06 NOTE — Telephone Encounter (Signed)
No to all answers. °

## 2018-12-07 ENCOUNTER — Ambulatory Visit (AMBULATORY_SURGERY_CENTER): Payer: BC Managed Care – PPO | Admitting: Gastroenterology

## 2018-12-07 ENCOUNTER — Encounter: Payer: Self-pay | Admitting: Gastroenterology

## 2018-12-07 ENCOUNTER — Other Ambulatory Visit: Payer: Self-pay

## 2018-12-07 VITALS — BP 125/67 | HR 58 | Temp 98.8°F | Resp 13 | Ht 68.0 in | Wt 195.0 lb

## 2018-12-07 DIAGNOSIS — Z1211 Encounter for screening for malignant neoplasm of colon: Secondary | ICD-10-CM | POA: Diagnosis not present

## 2018-12-07 DIAGNOSIS — Z86010 Personal history of colonic polyps: Secondary | ICD-10-CM | POA: Diagnosis not present

## 2018-12-07 MED ORDER — SODIUM CHLORIDE 0.9 % IV SOLN: 500.0000 mL | Freq: Once | INTRAVENOUS | Status: AC

## 2018-12-07 NOTE — Progress Notes (Signed)
Pt's states no medical or surgical changes since previsit or office visit.  Temp-courtney washingto  Vital signs-judy branson

## 2018-12-07 NOTE — Patient Instructions (Signed)
YOU HAD AN ENDOSCOPIC PROCEDURE TODAY AT THE [facility]:   Refer to the procedure report that was given to you for any specific questions about what was found during the examination.  If the procedure report does not answer your questions, please call your gastroenterologist to clarify.  If you requested that your care partner not be given the details of your procedure findings, then the procedure report has been included in a sealed envelope for you to review at your convenience later.  YOU SHOULD EXPECT: Some feelings of bloating in the abdomen. Passage of more gas than usual.  Walking can help get rid of the air that was put into your GI tract during the procedure and reduce the bloating. If you had a lower endoscopy (such as a colonoscopy or flexible sigmoidoscopy) you may notice spotting of blood in your stool or on the toilet paper. If you underwent a bowel prep for your procedure, you may not have a normal bowel movement for a few days.  Please Note:  You might notice some irritation and congestion in your nose or some drainage.  This is from the oxygen used during your procedure.  There is no need for concern and it should clear up in a day or so.  SYMPTOMS TO REPORT IMMEDIATELY:   Following lower endoscopy (colonoscopy or flexible sigmoidoscopy):  Excessive amounts of blood in the stool  Significant tenderness or worsening of abdominal pains  Swelling of the abdomen that is new, acute  Fever of 100F or higher  For urgent or emergent issues, a gastroenterologist can be reached at any hour by calling [phone].   DIET:  We do recommend a small meal at first, but then you may proceed to your regular diet.  Drink plenty of fluids but you should avoid alcoholic beverages for 24 hours.  ACTIVITY:  You should plan to take it easy for the rest of today and you should NOT DRIVE or use heavy machinery until tomorrow (because of the sedation medicines used during the test).     FOLLOW UP: Our staff will call the number listed on your records 48-72 hours following your procedure to check on you and address any questions or concerns that you may have regarding the information given to you following your procedure. If we do not reach you, we will leave a message.  We will attempt to reach you two times.  During this call, we will ask if you have developed any symptoms of COVID 19. If you develop any symptoms (ie: fever, flu-like symptoms, shortness of breath, cough etc.) before then, please call [phone].  If you test positive for Covid 19 in the 2 weeks post procedure, please call and report this information to us.    If any biopsies were taken you will be contacted by phone or by letter within the next 1-3 weeks.  Please call us at [phone] if you have not heard about the biopsies in 3 weeks.    SIGNATURES/CONFIDENTIALITY: You and/or your care partner have signed paperwork which will be entered into your electronic medical record.  These signatures attest to the fact that that the information above on your After Visit Summary has been reviewed and is understood.  Full responsibility of the confidentiality of this discharge information lies with you and/or your care-partner. 

## 2018-12-07 NOTE — Progress Notes (Signed)
Report given to PACU, vss 

## 2018-12-07 NOTE — Op Note (Signed)
[facility] Patient Name: Debra [name] Procedure Date: 12/07/2018 10:21 AM MRN: [id number] Endoscopist: Mallie Mussel L. Loletha Carrow , MD Age: 53 Referring MD:  Date of Birth: Dec 24, 1965 Gender: Female Account #: [id number] Procedure:                Colonoscopy Indications:              Screening for colorectal malignant neoplasm (last                            colonoscopy in 2010 done for history of CRC in [place]) Medicines:                Monitored Anesthesia Care Procedure:                Pre-Anesthesia Assessment:                           - Prior to the procedure, a History and Physical                            was performed, and patient medications and                            allergies were reviewed. The patient's tolerance of                            previous anesthesia was also reviewed. The risks                            and benefits of the procedure and the sedation                            options and risks were discussed with the patient.                            All questions were answered, and informed consent                            was obtained. Prior Anticoagulants: The patient has                            taken no previous anticoagulant or antiplatelet                            agents. ASA Grade Assessment: II - A patient with                            mild systemic disease. After reviewing the risks                            and benefits, the patient was deemed in                            satisfactory condition to undergo the procedure.  After obtaining informed consent, the colonoscope                            was passed under direct vision. Throughout the                            procedure, the patient's blood pressure, pulse, and                            oxygen saturations were monitored continuously. The                            Colonoscope was introduced through the anus and   advanced to the the cecum, identified by                            appendiceal orifice and ileocecal valve. The                            colonoscopy was performed without difficulty. The                            patient tolerated the procedure well. The quality                            of the bowel preparation was excellent. The                            ileocecal valve, appendiceal orifice, and rectum                            were photographed. Scope In: 10:31:15 AM Scope Out: 10:47:06 AM Scope Withdrawal Time: 0 hours 11 minutes 24 seconds  Total Procedure Duration: 0 hours 15 minutes 51 seconds  Findings:                 The perianal and digital rectal examinations were                            normal.                           The entire examined colon appeared normal.                           Retroflexion in the rectum was not performed due to                            narrow anatomy. Complications:            No immediate complications. Estimated Blood Loss:     Estimated blood loss: none. Impression:               - The entire examined colon is normal.                           - No specimens collected.  Recommendation:           - Patient has a contact number available for                            emergencies. The signs and symptoms of potential                            delayed complications were discussed with the                            patient. Return to normal activities tomorrow.                            Written discharge instructions were provided to the                            patient.                           - Resume previous diet.                           - Continue present medications.                           - Repeat colonoscopy in 10 years for screening                            purposes. Marijah Larranaga L. Loletha Carrow, MD 12/07/2018 10:53:03 AM This report has been signed electronically.

## 2018-12-11 ENCOUNTER — Telehealth: Payer: Self-pay

## 2018-12-11 NOTE — Telephone Encounter (Signed)
  Follow up Call-  Call back number 12/07/2018  Post procedure Call Back phone  # [phone]  Permission to leave phone message Yes  Some recent data might be hidden     Patient questions:  Do you have a fever, pain , or abdominal swelling? No. Pain Score  0 *  Have you tolerated food without any problems? Yes.    Have you been able to return to your normal activities? Yes.    Do you have any questions about your discharge instructions: Diet   No. Medications  No. Follow up visit  No.  Do you have questions or concerns about your Care? No.  Actions: * If pain score is 4 or above: No action needed, pain <4. 1. Have you developed a fever since your procedure? no  2.   Have you had an respiratory symptoms (SOB or cough) since your procedure? no  3.   Have you tested positive for COVID 19 since your procedure no  4.   Have you had any family members/close contacts diagnosed with the COVID 19 since your procedure?  no   If yes to any of these questions please route to Joylene John, RN and Alphonsa Gin, Therapist, sports.

## 2019-01-02 ENCOUNTER — Encounter: Payer: Self-pay | Admitting: Cardiology

## 2019-01-11 ENCOUNTER — Other Ambulatory Visit: Payer: Self-pay

## 2019-01-11 ENCOUNTER — Ambulatory Visit (INDEPENDENT_AMBULATORY_CARE_PROVIDER_SITE_OTHER): Payer: BC Managed Care – PPO | Admitting: Cardiology

## 2019-01-11 ENCOUNTER — Encounter: Payer: Self-pay | Admitting: Cardiology

## 2019-01-11 VITALS — BP 120/62 | HR 60 | Resp 10 | Ht 68.0 in | Wt 192.0 lb

## 2019-01-11 DIAGNOSIS — E785 Hyperlipidemia, unspecified: Secondary | ICD-10-CM

## 2019-01-11 DIAGNOSIS — R0600 Dyspnea, unspecified: Secondary | ICD-10-CM

## 2019-01-11 DIAGNOSIS — R0609 Other forms of dyspnea: Secondary | ICD-10-CM | POA: Diagnosis not present

## 2019-01-11 NOTE — Patient Instructions (Signed)
Medication Instructions:  Your physician recommends that you continue on your current medications as directed. Please refer to the Current Medication list given to you today.  If you need a refill on your cardiac medications before your next appointment, please call your pharmacy.   Lab work: NONE   If you have labs (blood work) drawn today and your tests are completely normal, you will receive your results only by: Marland Kitchen MyChart Message (if you have MyChart) OR . A paper copy in the mail If you have any lab test that is abnormal or we need to change your treatment, we will call you to review the results.  Testing/Procedures: Non-Cardiac CT scanning, (CAT scanning), is a noninvasive, special x-ray that produces cross-sectional images of the body using x-rays and a computer. CT scans help physicians diagnose and treat medical conditions. For some CT exams, a contrast material is used to enhance visibility in the area of the body being studied. CT scans provide greater clarity and reveal more details than regular x-ray exams.    Follow-Up: At [facility], you and your health needs are our priority.  As part of our continuing mission to provide you with exceptional heart care, we have created designated Provider Care Teams.  These Care Teams include your primary Cardiologist (physician) and Advanced Practice Providers (APPs -  Physician Assistants and Nurse Practitioners) who all work together to provide you with the care you need, when you need it. You will need a follow up appointment in 1 months.  Please call our office 2 months in advance to schedule this appointment.  You may see No primary care provider on file. or another member of our [employer] Provider Team in [place]: Shirlee More, MD . Jyl Heinz, MD  Any Other Special Instructions Will Be Listed Below (If Applicable). Coronary Calcium Scan A coronary calcium scan is an imaging test used to look for deposits of calcium and  other fatty materials (plaques) in the inner lining of the blood vessels of the heart (coronary arteries). These deposits of calcium and plaques can partly clog and narrow the coronary arteries without producing any symptoms or warning signs. This puts a person at risk for a heart attack. This test can detect these deposits before symptoms develop. Tell a health care provider about:  Any allergies you have.  All medicines you are taking, including vitamins, herbs, eye drops, creams, and over-the-counter medicines.  Any problems you or family members have had with anesthetic medicines.  Any blood disorders you have.  Any surgeries you have had.  Any medical conditions you have.  Whether you are pregnant or may be pregnant. What are the risks? Generally, this is a safe procedure. However, problems may occur, including:  Harm to a pregnant woman and her unborn baby. This test involves the use of radiation. Radiation exposure can be dangerous to a pregnant woman and her unborn baby. If you are pregnant, you generally should not have this procedure done.  Slight increase in the risk of cancer. This is because of the radiation involved in the test. What happens before the procedure? No preparation is needed for this procedure. What happens during the procedure?   You will undress and remove any jewelry around your neck or chest.  You will put on a hospital gown.  Sticky electrodes will be placed on your chest. The electrodes will be connected to an electrocardiogram (ECG) machine to record a tracing of the electrical activity of your heart.  A CT scanner will take pictures of your heart. During this time, you will be asked to lie still and hold your breath for 2-3 seconds while a picture of your heart is being taken. The procedure may vary among health care providers and hospitals. What happens after the procedure?  You can get dressed.  You can return to your normal activities.   It is up to you to get the results of your test. Ask your health care provider, or the department that is doing the test, when your results will be ready. Summary  A coronary calcium scan is an imaging test used to look for deposits of calcium and other fatty materials (plaques) in the inner lining of the blood vessels of the heart (coronary arteries).  Generally, this is a safe procedure. Tell your health care provider if you are pregnant or may be pregnant.  No preparation is needed for this procedure.  A CT scanner will take pictures of your heart.  You can return to your normal activities after the scan is done. This information is not intended to replace advice given to you by your health care provider. Make sure you discuss any questions you have with your health care provider. Document Released: 11/05/2007 Document Revised: 04/21/2017 Document Reviewed: 03/28/2016 Elsevier Patient Education  2020 [employer].

## 2019-01-11 NOTE — Progress Notes (Signed)
Cardiology Consultation:    Date:  01/11/2019   ID:  Debra [name], DOB 1966-04-20, MRN [id number]  PCP:  Burnis Medin, MD  Cardiologist:  Jenne Campus, MD   Referring MD: Burnis Medin, MD   Chief Complaint  Patient presents with   Chest tightness since starting medication    Left breast pain, shoulder pain    History of Present Illness:    Debra [name] is a 53 y.o. female who is being seen today for the evaluation of chest pain at the request of Panosh, Standley Brooking, MD.  She is a healthy lady with no hypertension no diabetes mild elevation of cholesterol.  She been struggling with some weight management.  She want to lose weight however I am not succeeding.  Recently she was put on medication to help her to lose weight since that time she started experiencing some chest pain.  She is very worried about it and that is what she is in my office.  She described to have some sharp and sometimes heavy tightness-like sensation in the left side of her chest lasting for up to few seconds.  It is not related to exercise.  There is no shortness of breath there is no sweating associated with this sensation.  At the same time she is able to exercise she walks on the regular basis with her friend as well as she does have a palate on a bike at home and she does use the bike on the regular basis.  And she had no difficulty doing it. Risk factors include only mildly elevated cholesterol.,  Last results of cholesterol have is from year ago her LDL was 116 HDL 54. She does not have family history of premature coronary artery disease She never smoked  Past Medical History:  Diagnosis Date   Abdominal pain, epigastric 01/06/2010   Chronic headaches    Esophagitis    ESOPHAGITIS, HX OF 02/18/2010   EXTERNAL HEMORRHOIDS 02/18/2010   FATIGUE 04/13/2007   Gallstones    GERD 03/13/2007   Headache(784.0) 03/13/2007   HIP PAIN, RIGHT 02/23/2009   LIVER FUNCTION TESTS, ABNORMAL  01/06/2010   LOW BACK PAIN, MILD 08/06/2008   Pancreatitis 12/18/09   PARESTHESIA 03/13/2007   Pulmonary nodule    incidental on ct 2007  neg cxray 2011   Ruptured ovarian cyst    VARICOSE VEIN 01/06/2010    Past Surgical History:  Procedure Laterality Date   CHOLECYSTECTOMY     TONSILLECTOMY     TONSILLECTOMY     Uterine Polyps      Current Medications: Current Meds  Medication Sig   clindamycin (CLINDAGEL) 1 % gel Apply topically 2 (two) times daily. To skin rash   DEXILANT 60 MG capsule TAKE 1 CAPSULE(60 MG) BY MOUTH DAILY   ibuprofen (ADVIL,MOTRIN) 200 MG tablet Take 600 mg by mouth every 6 (six) hours as needed for moderate pain.    Multiple Vitamin (MULTI-VITAMIN PO) Take 1 tablet by mouth daily.   SUMAtriptan (IMITREX) 100 MG tablet TAKE 1 TABLET BY MOUTH IMMEDIATELY. MAY REPEAT AFTER 2 HOURS   Current Facility-Administered Medications for the 01/11/19 encounter (Office Visit) with Park Liter, MD  Medication   0.9 %  sodium chloride infusion     Allergies:   Gadolinium, Penicillins, and Sulfonamide derivatives   Social History   Socioeconomic History   Marital status: Married    Spouse name: Not on file   Number of children: 0  Years of education: Not on file   Highest education level: Not on file  Occupational History   Occupation: PHARMACIST    Employer: [employer] resource strain: Not on file   Food insecurity    Worry: Not on file    Inability: Not on file   Transportation needs    Medical: Not on file    Non-medical: Not on file  Tobacco Use   Smoking status: Never Smoker   Smokeless tobacco: Never Used  Substance and Sexual Activity   Alcohol use: Yes    Comment: 2 per month   Drug use: No   Sexual activity: Not on file  Lifestyle   Physical activity    Days per week: Not on file    Minutes per session: Not on file   Stress: Not on file  Relationships   Social connections     Talks on phone: Not on file    Gets together: Not on file    Attends religious service: Not on file    Active member of club or organization: Not on file    Attends meetings of clubs or organizations: Not on file    Relationship status: Not on file  Other Topics Concern   Not on file  Social History Narrative   Dogs 2     married is a Software engineer never smoked.  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 There is no history of Esophageal cancer, Rectal cancer, or Stomach cancer. ROS:   Please see the history of present illness.    All 14 point review of systems negative except as described per history of present illness.  EKGs/Labs/Other Studies Reviewed:    The following studies were reviewed today:   EKG:  EKG is  ordered today.  The ekg ordered today demonstrates   Recent Labs: 04/18/2018: BUN 10; Creatinine, Ser 0.84; Hemoglobin 14.3; Platelets 287; Potassium 4.0; Sodium 137  Recent Lipid Panel    Component Value Date/Time   CHOL 186 12/29/2017 1135   TRIG 78.0 12/29/2017 1135   HDL 54.60 12/29/2017 1135   CHOLHDL 3 12/29/2017 1135   VLDL 15.6 12/29/2017 1135   LDLCALC 116 (H) 12/29/2017 1135    Physical Exam:    VS:  BP 120/62    Pulse 60    Resp 10    Ht 5\' 8"  (1.727 m)    Wt 192 lb (87.1 kg)    BMI 29.19 kg/m     Wt Readings from Last 3 Encounters:  01/11/19 192 lb (87.1 kg)  12/07/18 195 lb (88.5 kg)  11/22/18 195 lb (88.5 kg)     GEN:   Well nourished, well developed in no acute distress HEENT: Normal NECK: No JVD; No carotid bruits  LYMPHATICS: No lymphadenopathy CARDIAC: RRR, no murmurs, no rubs, no gallops RESPIRATORY:  Clear to auscultation without rales, wheezing or rhonchi  ABDOMEN: Soft, non-tender, non-distended MUSCULOSKELETAL:  No edema; No deformity  SKIN: Warm and dry NEUROLOGIC:  Alert and oriented x 3 PSYCHIATRIC:  Normal affect   ASSESSMENT:    1. Dyslipidemia   2. Dyspnea on exertion    PLAN:    In order of problems listed above:  1. Atypical chest pain.  We discussed in length what to do with the situation.  I have rather low level suspicion that she can significant coronary artery disease.  I think the best test for her will be to assess calcium score.  I do this mostly for prognostic reasons obviously if calcium score will be 0 we can drop the issue of potentially having active coronary artery disease.  We did talk already about risk factors modifications.  High calcium score will help [place] to push towards getting some medication for dyslipidemia. 2. Dyspnea on exertion.  I will not investigate this problem aggressively right now.  She is able to walk she is able to do palatine bike and doing quite well from that point of view. 3. Dyslipidemia not at the point of treating it however will do calcium score and based on that we will decide if treatment is needed.  We did talk about need to exercise as well as good diet she is very conscious about those issues she counts calories on the regular basis.   Medication Adjustments/Labs and Tests Ordered: Current medicines are reviewed at length with the patient today.  Concerns regarding medicines are outlined above.  No orders of the defined types were placed in this encounter.  No orders of the defined types were placed in this encounter.   Signed, Park Liter, MD, [facility]. 01/11/2019 8:41 AM    [facility]

## 2019-01-13 ENCOUNTER — Ambulatory Visit (HOSPITAL_COMMUNITY)
Admission: EM | Admit: 2019-01-13 | Discharge: 2019-01-13 | Disposition: A | Discharge status: Home / Self Care | Payer: BC Managed Care – PPO | Attending: Family Medicine | Admitting: Family Medicine

## 2019-01-13 ENCOUNTER — Other Ambulatory Visit: Payer: Self-pay

## 2019-01-13 ENCOUNTER — Encounter (HOSPITAL_COMMUNITY): Payer: Self-pay | Admitting: *Deleted

## 2019-01-13 DIAGNOSIS — R51 Headache: Secondary | ICD-10-CM

## 2019-01-13 DIAGNOSIS — R519 Headache, unspecified: Secondary | ICD-10-CM

## 2019-01-13 MED ORDER — METHYLPREDNISOLONE SODIUM SUCC 125 MG IJ SOLR
125.0000 mg | Freq: Once | INTRAMUSCULAR | Status: AC
  Administered 2019-01-13: 125 mg via INTRAMUSCULAR

## 2019-01-13 MED ORDER — METHYLPREDNISOLONE SODIUM SUCC 125 MG IJ SOLR
INTRAMUSCULAR | Status: AC
  Filled 2019-01-13: qty 2

## 2019-01-13 NOTE — ED Provider Notes (Signed)
[facility]    CSN: [id number] Arrival date & time: 01/13/19  1327      History   Chief Complaint No chief complaint on file.   HPI Debra [name] is a 53 y.o. female with history of migraines presenting for headache since Friday.  Patient denies pain, the states that her head feels "foggy and heavy, kind of like a balloon ".  Patient denies fever, nasal congestion, sinus pressure, tinnitus, change in vision.  Patient denies thunderclap headache, worst headache of life.  States sensation is throughout her head.  Patient has a baseline of migraines: States these tend to be unilateral, behind her right eye.  Patient denies photophobia, phonophobia, nausea, vomiting.  Patient took Imitrex both on Wednesday and Thursday for migraines, has not tried Imitrex for this.  Patient largely seeking reassurance that "nothing bad is happening ".  Patient denies history of CVA, MI.  Of note, patient started phentermine well over a week ago, and has seen cardiology since due to chest pain.  Patient has not taken phentermine in 3 days, denies chest pain today.  Cardiac work-up thus far has been unremarkable.   Past Medical History:  Diagnosis Date  . Abdominal pain, epigastric 01/06/2010  . Chronic headaches   . Esophagitis   . ESOPHAGITIS, HX OF 02/18/2010  . EXTERNAL HEMORRHOIDS 02/18/2010  . FATIGUE 04/13/2007  . Gallstones   . GERD 03/13/2007  . Headache(784.0) 03/13/2007  . HIP PAIN, RIGHT 02/23/2009  . LIVER FUNCTION TESTS, ABNORMAL 01/06/2010  . LOW BACK PAIN, MILD 08/06/2008  . Pancreatitis 12/18/09  . PARESTHESIA 03/13/2007  . Pulmonary nodule    incidental on ct 2007  neg cxray 2011  . Ruptured ovarian cyst   . VARICOSE VEIN 01/06/2010    Patient Active Problem List   Diagnosis Date Noted  . Dyspnea on exertion 01/11/2019  . Primary osteoarthritis of left knee 08/01/2017  . Chronic pain of left knee 05/29/2017  . Dyslipidemia 01/24/2017  . Upper airway cough syndrome  03/27/2014  . Protracted URI 02/28/2013  . Migraine with aura 01/10/2012  . Chronic sore throat 09/20/2011  . Neck pain, acute 03/25/2011  . Atypical chest pain 07/20/2010  . Chest pain, central 07/02/2010  . Pulmonary nodule   . EXTERNAL HEMORRHOIDS 02/18/2010  . PANCREATITIS, ACUTE 02/18/2010  . ESOPHAGITIS, HX OF 02/18/2010  . VARICOSE VEIN 01/06/2010  . CRAMP OF LIMB 01/06/2010  . ABDOMINAL PAIN, EPIGASTRIC 01/06/2010  . LIVER FUNCTION TESTS, ABNORMAL 01/06/2010  . DIARRHEA 10/14/2009  . HIP PAIN, RIGHT 02/23/2009  . BACK PAIN 02/23/2009  . LOW BACK PAIN, MILD 08/06/2008  . ABDOMINAL BLOATING 08/06/2008  . OTHER SYMPTOMS INVOLVING DIGESTIVE SYSTEM OTHER 08/06/2008  . Chronic cough 06/03/2008  . FATIGUE 04/13/2007  . URINARY FREQUENCY 04/13/2007  . GERD 03/13/2007  . PARESTHESIA 03/13/2007  . HEADACHE 03/13/2007    Past Surgical History:  Procedure Laterality Date  . CHOLECYSTECTOMY    . TONSILLECTOMY    . TONSILLECTOMY    . Uterine Polyps      OB History   No obstetric history on file.      Home Medications    Prior to Admission medications   Medication Sig Start Date End Date Taking? Authorizing Provider  DEXILANT 60 MG capsule TAKE 1 CAPSULE(60 MG) BY MOUTH DAILY 09/04/18  Yes Panosh, Standley Brooking, MD  PHENTERMINE HCL PO Take by mouth.   Yes [provider]  SUMAtriptan (IMITREX) 100 MG tablet TAKE 1 TABLET BY MOUTH  IMMEDIATELY. MAY REPEAT AFTER 2 HOURS 11/26/18  Yes Panosh, Standley Brooking, MD  ibuprofen (ADVIL,MOTRIN) 200 MG tablet Take 600 mg by mouth every 6 (six) hours as needed for moderate pain.     [provider]    Family History Family History  Problem Relation Age of Onset  . Alcohol abuse Father   . Cardiomyopathy Father   . Anemia Father   . Heart disease Father   . Other Father        Irrg. Heart Beat  . Lung cancer Mother   . Diabetes Mother   . Other Brother        Irr. Heart Beat  . Pancreatitis Maternal Grandfather   .  Colon cancer Maternal Grandmother   . Esophageal cancer Neg Hx   . Rectal cancer Neg Hx   . Stomach cancer Neg Hx     Social History Social History   Tobacco Use  . Smoking status: Never Smoker  . Smokeless tobacco: Never Used  Substance Use Topics  . Alcohol use: Yes    Comment: rarely  . Drug use: Never     Allergies   Gadolinium, Penicillins, and Sulfonamide derivatives   Review of Systems Review of Systems  Constitutional: Negative for fatigue and fever.  HENT: Negative for ear pain, sinus pain, sore throat, tinnitus, trouble swallowing and voice change.   Eyes: Negative for pain, redness and visual disturbance.  Respiratory: Negative for cough and shortness of breath.   Cardiovascular: Negative for chest pain and palpitations.  Gastrointestinal: Negative for abdominal pain, diarrhea, nausea and vomiting.  Musculoskeletal: Negative for arthralgias, back pain, myalgias, neck pain and neck stiffness.  Skin: Negative for rash and wound.  Neurological: Positive for headaches. Negative for dizziness, tremors, seizures, syncope, facial asymmetry, speech difficulty, weakness, light-headedness and numbness.     Physical Exam Triage Vital Signs ED Triage Vitals  Enc Vitals Group     BP 01/13/19 1420 121/73     Pulse Rate 01/13/19 1420 76     Resp 01/13/19 1420 16     Temp 01/13/19 1420 98.2 F (36.8 C)     Temp Source 01/13/19 1420 Other     SpO2 01/13/19 1420 99 %     Weight --      Height --      Head Circumference --      Peak Flow --      Pain Score 01/13/19 1421 0     Pain Loc --      Pain Edu? --      Excl. in [place]? --    No data found.  Updated Vital Signs BP 121/73   Pulse 76   Temp 98.2 F (36.8 C) (Other (Comment))   Resp 16   LMP 01/03/2019 (Approximate)   SpO2 99%   Visual Acuity Right Eye Distance:   Left Eye Distance:   Bilateral Distance:    Right Eye Near:   Left Eye Near:    Bilateral Near:     Physical Exam Vitals signs  reviewed.  Constitutional:      General: She is not in acute distress.    Appearance: She is normal weight. She is not ill-appearing.  HENT:     Head: Normocephalic and atraumatic.  Eyes:     General: No scleral icterus.       Right eye: No discharge.        Left eye: No discharge.     Extraocular Movements: Extraocular movements intact.  Pupils: Pupils are equal, round, and reactive to light.  Neck:     Musculoskeletal: Normal range of motion and neck supple. No muscular tenderness.  Cardiovascular:     Rate and Rhythm: Normal rate and regular rhythm.     Pulses: Normal pulses.     Heart sounds: No murmur.  Pulmonary:     Effort: Pulmonary effort is normal. No respiratory distress.     Breath sounds: No wheezing.  Chest:     Chest wall: No tenderness.  Abdominal:     General: Abdomen is flat.     Palpations: Abdomen is soft.     Tenderness: There is no abdominal tenderness. There is no guarding.  Lymphadenopathy:     Cervical: No cervical adenopathy.  Skin:    General: Skin is warm.     Capillary Refill: Capillary refill takes less than 2 seconds.     Coloration: Skin is not jaundiced or pale.     Findings: No rash.  Neurological:     General: No focal deficit present.     Mental Status: She is alert and oriented to person, place, and time.  Psychiatric:        Mood and Affect: Mood normal.        Thought Content: Thought content normal.      UC Treatments / Results  Labs (all labs ordered are listed, but only abnormal results are displayed) Labs Reviewed - No data to display  EKG   Radiology No results found.  Procedures Procedures (including critical care time)  Medications Ordered in UC Medications  methylPREDNISolone sodium succinate (SOLU-MEDROL) 125 mg/2 mL injection 125 mg (125 mg Intramuscular Given 01/13/19 1453)  methylPREDNISolone sodium succinate (SOLU-MEDROL) 125 mg/2 mL injection (has no administration in time range)    Initial  Impression / Assessment and Plan / UC Course  I have reviewed the triage vital signs and the nursing notes.  Pertinent labs & imaging results that were available during my care of the patient were reviewed by me and considered in my medical decision making (see chart for details).     1.  Acute intractable headache No neurocognitive deficits on exam patient is hemodynamically stable without signs/symptoms of active bleeding.  Likely related to migraine: Patient instructed to take Aleve, Imitrex when she gets home.  Given Solu-Medrol injection in office which she tolerated well.  Return precautions discussed, patient verbalized understanding and is agreeable to plan. Final Clinical Impressions(s) / UC Diagnoses   Final diagnoses:  Acute intractable headache, unspecified headache type     Discharge Instructions     Go home and take 1 dose of Aleve in conjunction with 1 dose of your Imitrex, and take a nap. Call PCP tomorrow to inform them of urgent care visit/treatment. Keep headache log for future reference frequency of migraines. Return if you develop worsening head pain, change in vision, facial droop, weakness, dizziness, vomiting.    ED Prescriptions    None     Controlled Substance Prescriptions [place] Controlled Substance Registry consulted? Not Applicable   Quincy Sheehan, [place] 01/13/19 1530

## 2019-01-13 NOTE — Discharge Instructions (Addendum)
Go home and take 1 dose of Aleve in conjunction with 1 dose of your Imitrex, and take a nap. Call PCP tomorrow to inform them of urgent care visit/treatment. Keep headache log for future reference frequency of migraines. Return if you develop worsening head pain, change in vision, facial droop, weakness, dizziness, vomiting.

## 2019-01-13 NOTE — ED Triage Notes (Addendum)
C/O feeling "foggy" with "head heavy"; "I just kinda feel funny all up here [in my head]" starting 2 days ago.  Denies any vision changes, "but my eyes feel funny", and occasionally has "shaky feeling".  States seems to improve if resting, but as soon as she moves or does anything, sxs are noticeable again. States had started phentermine approx 10 days ago and started having chest pain, so she saw a cardiologist 2 days ago (prior to current sx onset); states has cardiac CT ordered; has not taken phentermine in 3 days.

## 2019-01-21 IMAGING — DX DG CHEST 2V
2 series · 2 of 2 positions shown · non-contrast
Comparison: 01/20/2017

CLINICAL DATA: Persistent cough for 8 months, history of pulmonary
nodule

EXAM:
CHEST - 2 VIEW

[chest pa]
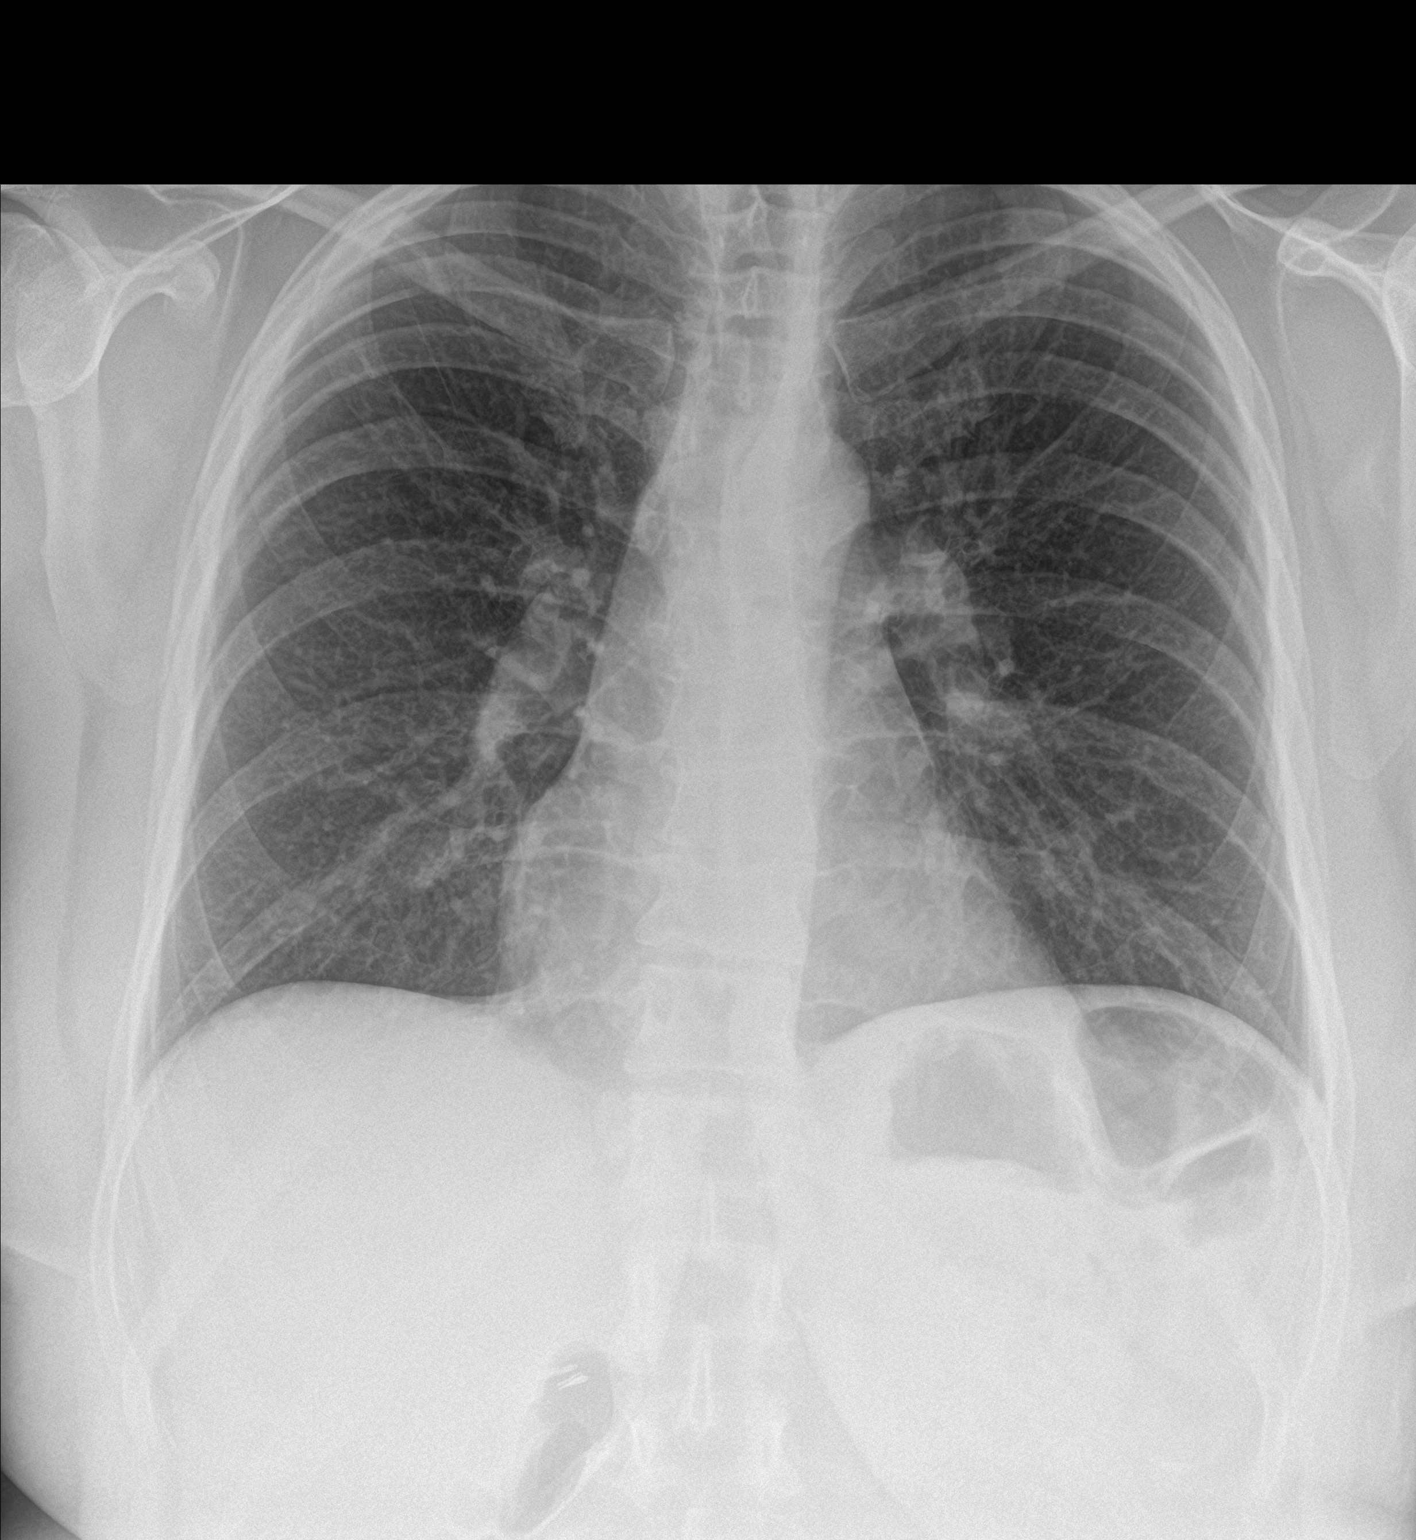

[chest lat]
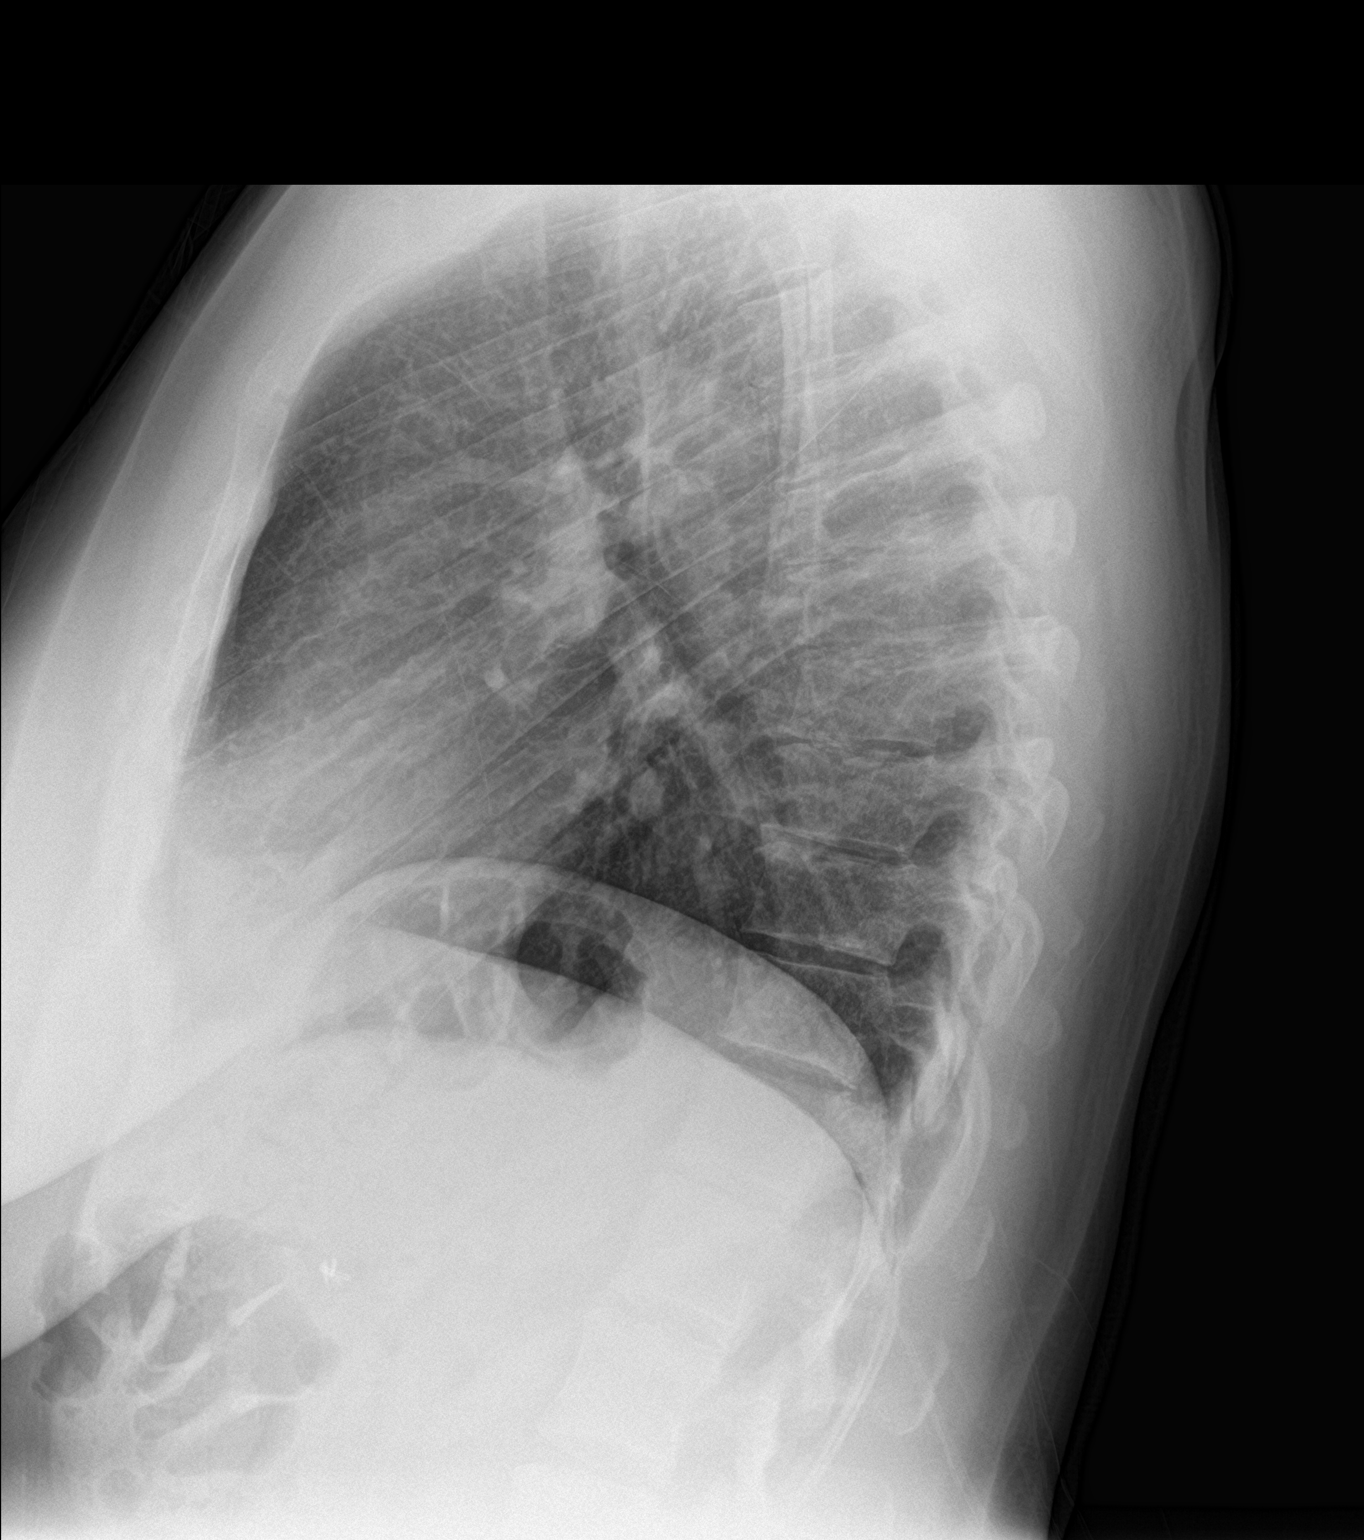

[2 of 2 positions shown; findings below may reference images not displayed]

FINDINGS: Normal heart size, mediastinal contours, and pulmonary vascularity.

Lungs clear.

No pleural effusion or pneumothorax.

Bones unremarkable.
IMPRESSION: Normal exam.

## 2019-02-08 ENCOUNTER — Ambulatory Visit: Payer: BC Managed Care – PPO | Admitting: Cardiology

## 2019-02-15 ENCOUNTER — Other Ambulatory Visit: Payer: Self-pay

## 2019-02-15 ENCOUNTER — Ambulatory Visit (INDEPENDENT_AMBULATORY_CARE_PROVIDER_SITE_OTHER)
Admission: RE | Admit: 2019-02-15 | Discharge: 2019-02-15 | Disposition: A | Discharge status: Home / Self Care | Payer: Self-pay | Source: Ambulatory Visit | Attending: Cardiology | Admitting: Cardiology

## 2019-02-15 DIAGNOSIS — E785 Hyperlipidemia, unspecified: Secondary | ICD-10-CM

## 2019-02-15 DIAGNOSIS — R0609 Other forms of dyspnea: Secondary | ICD-10-CM

## 2019-03-08 ENCOUNTER — Ambulatory Visit (INDEPENDENT_AMBULATORY_CARE_PROVIDER_SITE_OTHER): Payer: BC Managed Care – PPO | Admitting: Cardiology

## 2019-03-08 ENCOUNTER — Other Ambulatory Visit: Payer: Self-pay

## 2019-03-08 ENCOUNTER — Encounter: Payer: Self-pay | Admitting: Cardiology

## 2019-03-08 VITALS — BP 126/86 | HR 86 | Ht 68.0 in | Wt 196.0 lb

## 2019-03-08 DIAGNOSIS — R0600 Dyspnea, unspecified: Secondary | ICD-10-CM | POA: Diagnosis not present

## 2019-03-08 DIAGNOSIS — R0789 Other chest pain: Secondary | ICD-10-CM | POA: Diagnosis not present

## 2019-03-08 DIAGNOSIS — E785 Hyperlipidemia, unspecified: Secondary | ICD-10-CM | POA: Diagnosis not present

## 2019-03-08 DIAGNOSIS — R0609 Other forms of dyspnea: Secondary | ICD-10-CM

## 2019-03-08 NOTE — Progress Notes (Signed)
Cardiology Office Note:    Date:  03/08/2019   ID:  Andrez Grime, DOB 1966-02-03, MRN [id number]  PCP:  Burnis Medin, MD  Cardiologist:  Jenne Campus, MD    Referring MD: Burnis Medin, MD   Chief Complaint  Patient presents with  . Follow-up  Doing very well  History of Present Illness:    Debra [name] is a 53 y.o. female who was referred to [place] because of atypical chest pain as well as dyspnea on exertion.  She exercised on a regular basis she does have palatine bike and she is up a lot.  She did have calcium score which was 0 she is here to talk about it overall she is doing well exercising doing on basis does have some shortness of breath but overall getting better.  She does have LDL which is mildly elevated however with her because of score being 0 I recommended good diet and continue exercises.  Past Medical History:  Diagnosis Date  . Abdominal pain, epigastric 01/06/2010  . Chronic headaches   . Esophagitis   . ESOPHAGITIS, HX OF 02/18/2010  . EXTERNAL HEMORRHOIDS 02/18/2010  . FATIGUE 04/13/2007  . Gallstones   . GERD 03/13/2007  . Headache(784.0) 03/13/2007  . HIP PAIN, RIGHT 02/23/2009  . LIVER FUNCTION TESTS, ABNORMAL 01/06/2010  . LOW BACK PAIN, MILD 08/06/2008  . Pancreatitis 12/18/09  . PARESTHESIA 03/13/2007  . Pulmonary nodule    incidental on ct 2007  neg cxray 2011  . Ruptured ovarian cyst   . VARICOSE VEIN 01/06/2010    Past Surgical History:  Procedure Laterality Date  . CHOLECYSTECTOMY    . TONSILLECTOMY    . TONSILLECTOMY    . Uterine Polyps      Current Medications: Current Meds  Medication Sig  . DEXILANT 60 MG capsule TAKE 1 CAPSULE(60 MG) BY MOUTH DAILY  . ibuprofen (ADVIL,MOTRIN) 200 MG tablet Take 600 mg by mouth every 6 (six) hours as needed for moderate pain.   . SUMAtriptan (IMITREX) 100 MG tablet TAKE 1 TABLET BY MOUTH IMMEDIATELY. MAY REPEAT AFTER 2 HOURS   Current Facility-Administered Medications for the  03/08/19 encounter (Office Visit) with Park Liter, MD  Medication  . 0.9 %  sodium chloride infusion     Allergies:   Gadolinium, Penicillins, and Sulfonamide derivatives   Social History   Socioeconomic History  . Marital status: Married    Spouse name: Not on file  . Number of children: 0  . Years of education: Not on file  . Highest education level: Not on file  Occupational History  . Occupation: PHARMACIST    Employer: [employer]  Social Needs  . Financial resource strain: Not on file  . Food insecurity    Worry: Not on file    Inability: Not on file  . Transportation needs    Medical: Not on file    Non-medical: Not on file  Tobacco Use  . Smoking status: Never Smoker  . Smokeless tobacco: Never Used  Substance and Sexual Activity  . Alcohol use: Yes    Comment: rarely  . Drug use: Never  . Sexual activity: Not on file  Lifestyle  . Physical activity    Days per week: Not on file    Minutes per session: Not on file  . Stress: Not on file  Relationships  . Social Herbalist on phone: Not on file    Gets together: Not on file  Attends religious service: Not on file    Active member of club or organization: Not on file    Attends meetings of clubs or organizations: Not on file    Relationship status: Not on file  Other Topics Concern  . Not on file  Social History Narrative   Dogs 2     married is a Software engineer never smoked.  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 There is no history of Esophageal cancer, Rectal cancer, or Stomach cancer. ROS:   Please see the history of present illness.    All 14 point review of systems negative except as described per history of present illness  EKGs/Labs/Other Studies Reviewed:      Recent Labs: 04/18/2018: BUN 10; Creatinine, Ser 0.84; Hemoglobin 14.3; Platelets 287; Potassium 4.0; Sodium 137  Recent Lipid Panel    Component Value Date/Time   CHOL 186 12/29/2017 1135   TRIG 78.0 12/29/2017 1135   HDL 54.60 12/29/2017 1135   CHOLHDL 3 12/29/2017 1135   VLDL 15.6 12/29/2017 1135   LDLCALC 116 (H) 12/29/2017 1135    Physical Exam:    VS:  BP 126/86   Pulse 86   Ht 5\' 8"  (1.727 m)   Wt 196 lb (88.9 kg)   LMP 02/14/2019   SpO2 98%   BMI 29.80 kg/m     Wt Readings from Last 3 Encounters:  03/08/19 196 lb (88.9 kg)  01/11/19 192 lb (87.1 kg)  12/07/18 195 lb (88.5 kg)     GEN:  Well nourished, well developed in no acute distress HEENT: Normal NECK: No JVD; No carotid bruits LYMPHATICS: No lymphadenopathy CARDIAC: RRR, no murmurs, no rubs, no gallops RESPIRATORY:  Clear to auscultation without rales, wheezing or rhonchi  ABDOMEN: Soft, non-tender, non-distended MUSCULOSKELETAL:  No edema; No deformity  SKIN: Warm and dry LOWER EXTREMITIES: no swelling NEUROLOGIC:  Alert and oriented x 3 PSYCHIATRIC:  Normal affect   ASSESSMENT:    1. Atypical chest pain   2. Dyslipidemia   3. Dyspnea on exertion    PLAN:    In order of problems listed above:  1. Atypical chest pain denies having any now doing well calcium score 0.  Exercise on the regular basis which I will continue. 2. Dyslipidemia her LDL is 116 with her cousins with goal being 0 exercise and diet is recommended. 3. Dyspnea on exertion gradually better with exercises.   Medication Adjustments/Labs and Tests Ordered: Current medicines are reviewed at length with the patient  today.  Concerns regarding medicines are outlined above.  No orders of the defined types were placed in this encounter.  Medication changes: No orders of the defined types were placed in this encounter.   Signed, Park Liter, MD, [facility] 03/08/2019 3:44 PM  [facility]

## 2019-03-08 NOTE — Patient Instructions (Signed)
Medication Instructions:  Your physician recommends that you continue on your current medications as directed. Please refer to the Current Medication list given to you today.  *If you need a refill on your cardiac medications before your next appointment, please call your pharmacy*  Lab Work: None Ordered    Testing/Procedures: None Ordered  Follow-Up: At [facility], you and your health needs are our priority.  As part of our continuing mission to provide you with exceptional heart care, we have created designated Provider Care Teams.  These Care Teams include your primary Cardiologist (physician) and Advanced Practice Providers (APPs -  Physician Assistants and Nurse Practitioners) who all work together to provide you with the care you need, when you need it.  Your next appointment:   1 year  The format for your next appointment:   In Person  Provider:   You may see Dr. Agustin Cree or the following Advanced Practice Provider on your designated Care Team:    Laurann Montana, FNP   Other Instructions

## 2019-03-12 DIAGNOSIS — Z23 Encounter for immunization: Secondary | ICD-10-CM | POA: Diagnosis not present

## 2019-05-15 DIAGNOSIS — Z23 Encounter for immunization: Secondary | ICD-10-CM | POA: Diagnosis not present

## 2019-05-16 ENCOUNTER — Other Ambulatory Visit: Payer: Self-pay | Admitting: Internal Medicine

## 2019-05-31 ENCOUNTER — Encounter: Payer: Self-pay | Admitting: *Deleted

## 2019-06-07 ENCOUNTER — Ambulatory Visit: Payer: BC Managed Care – PPO | Admitting: Gastroenterology

## 2019-06-07 ENCOUNTER — Other Ambulatory Visit (INDEPENDENT_AMBULATORY_CARE_PROVIDER_SITE_OTHER): Payer: BC Managed Care – PPO

## 2019-06-07 ENCOUNTER — Encounter: Payer: Self-pay | Admitting: Gastroenterology

## 2019-06-07 VITALS — BP 118/64 | HR 85 | Temp 98.4°F | Ht 68.0 in | Wt 201.0 lb

## 2019-06-07 DIAGNOSIS — R140 Abdominal distension (gaseous): Secondary | ICD-10-CM | POA: Diagnosis not present

## 2019-06-07 DIAGNOSIS — R151 Fecal smearing: Secondary | ICD-10-CM | POA: Diagnosis not present

## 2019-06-07 LAB — IGA: IgA: 282 mg/dL (ref 68–378)

## 2019-06-07 NOTE — Progress Notes (Signed)
06/07/2019 Debra [name] [id number] [birth year]   HISTORY OF PRESENT ILLNESS: This is a pleasant 54 year old female who is a patient of Dr. Corena Pilgrim.  She just had a colonoscopy by him in July 2020 at which time her study was completely normal.  She presents here today with complaints of generalized abdominal bloating and gas.  She says that this has been present for at least the past 2 years, but seems to be getting increasingly worse.  Her abdomen just feels very uncomfortable.  She has a lot of flatus.  she is just wanting any recommendations as to what could be done.  She also reports occasional small amounts of fecal leakage.  She says that even when she has not had a bowel movement but she urinates and wipes there will be a small amount of stool on the toilet paper.   Past Medical History:  Diagnosis Date  . Abdominal pain, epigastric 01/06/2010  . Chronic headaches   . Esophagitis   . ESOPHAGITIS, HX OF 02/18/2010  . EXTERNAL HEMORRHOIDS 02/18/2010  . FATIGUE 04/13/2007  . Gallstones   . GERD 03/13/2007  . Headache(784.0) 03/13/2007  . HIP PAIN, RIGHT 02/23/2009  . LIVER FUNCTION TESTS, ABNORMAL 01/06/2010  . LOW BACK PAIN, MILD 08/06/2008  . Ovarian cyst   . Pancreatitis 12/18/09  . PARESTHESIA 03/13/2007  . Pulmonary nodule    incidental on ct 2007  neg cxray 2011  . Ruptured ovarian cyst   . VARICOSE VEIN 01/06/2010   Past Surgical History:  Procedure Laterality Date  . CHOLECYSTECTOMY    . TONSILLECTOMY    . Uterine Polyps      reports that she has never smoked. She has never used smokeless tobacco. She reports current alcohol use. She reports that she does not use drugs. family history includes Alcohol abuse in her father; Anemia in her father; Cardiomyopathy in her father; Colon cancer in her maternal grandmother; Diabetes in her mother; Heart disease in her father; Lung cancer in her mother; Other in her brother and father; Pancreatitis in her maternal grandfather.  Allergies  Allergen Reactions  . Gadolinium      Code: HIVES, Desc: Multihance--pt had hives, Onset Date: [id number] IVP Dye  . Penicillins     REACTION: rash  . Sulfonamide Derivatives     REACTION: rash      Outpatient Encounter Medications as of 06/07/2019  Medication Sig  . DEXILANT 60 MG capsule TAKE 1 CAPSULE(60 MG) BY MOUTH DAILY  . ibuprofen (ADVIL,MOTRIN) 200 MG tablet Take 600 mg by mouth every 6 (six) hours as needed for moderate pain.   . SUMAtriptan (IMITREX) 100 MG tablet TAKE 1 TABLET BY MOUTH IMMEDIATELY. MAY REPEAT AFTER 2 HOURS   Facility-Administered Encounter Medications as of 06/07/2019  Medication  . 0.9 %  sodium chloride infusion     REVIEW OF SYSTEMS  : All other systems reviewed and negative except where noted in the History of Present Illness.   PHYSICAL EXAM: BP 118/64   Pulse 85   Temp 98.4 F (36.9 C)   Ht '5\' 8"'  (1.727 m)   Wt 201 lb (91.2 kg)   BMI 30.56 kg/m  General: Well developed white female in no acute distress Head: Normocephalic and atraumatic Eyes:  Sclerae anicteric, conjunctiva pink. Ears: Normal auditory acuity Lungs: Clear throughout to auscultation; no increased WOB Heart: Regular rate and rhythm; no M/R/G. Abdomen: Soft, non-distended.  BS present.  Non-tender. Musculoskeletal: Symmetrical with no gross deformities  Skin: No lesions on visible extremities Extremities: No edema  Neurological: Alert oriented x 4, grossly non-focal Psychological:  Alert and cooperative. Normal mood and affect  ASSESSMENT AND PLAN: *54 year old female with complaints of generalized abdominal bloating and gas for at least the past 2 years, but seems to be getting worse.  Also reports a small amount of fecal leakage intermittently.  In regards to the bloating and gas we have discussed dietary measures.  She was given literature on lactose-free diet and will try that for a few weeks to see if it helps.  We will check celiac labs.  We will check  SIBO breath test to rule that out as well.  She was given the kit today.  In regards to the fecal leakage, I recommended she just start Benefiber 2 tablespoons in 8 to 16 ounces of fluid daily.  Will follow-up results.   CC:  Panosh, Standley Brooking, MD

## 2019-06-07 NOTE — Patient Instructions (Signed)
Your provider has requested that you go to the basement level for lab work before leaving today. Press "B" on the elevator. The lab is located at the first door on the left as you exit the elevator.  Please purchase the following medications over the counter and take as directed: Benefiber 2 tablespoons in 8 ounces liquid daily  Please follow a lactose free diet.  You have been given a testing kit to check for small intestine bacterial overgrowth (SIBO) which is completed by a company named [facility]. Make sure to return your test in the mail using the return mailing label given you along with the kit. Your demographic and insurance information have already been sent to the company and they should be in contact with you over the next week regarding this test. Please keep in mind that you will be getting a call from phone number [phone] or a similar number. If you do not hear from them within this time frame, please call our office at [phone].   If you are age 53 or older, your body mass index should be between 23-30. Your Body mass index is 30.56 kg/m. If this is out of the aforementioned range listed, please consider follow up with your Primary Care Provider.  If you are age 62 or younger, your body mass index should be between 19-25. Your Body mass index is 30.56 kg/m. If this is out of the aformentioned range listed, please consider follow up with your Primary Care Provider.   Due to recent changes in healthcare laws, you may see the results of your imaging and laboratory studies on MyChart before your provider has had a chance to review them.  We understand that in some cases there may be results that are confusing or concerning to you. Not all laboratory results come back in the same time frame and the provider may be waiting for multiple results in order to interpret others.  Please give [place] 48 hours in order for your provider to thoroughly review all the results before  contacting the office for clarification of your results.

## 2019-06-10 LAB — TISSUE TRANSGLUTAMINASE, IGA: (tTG) Ab, IgA: 1 U/mL

## 2019-06-12 ENCOUNTER — Encounter: Payer: Self-pay | Admitting: Gastroenterology

## 2019-06-12 DIAGNOSIS — Z23 Encounter for immunization: Secondary | ICD-10-CM | POA: Diagnosis not present

## 2019-06-12 DIAGNOSIS — R140 Abdominal distension (gaseous): Secondary | ICD-10-CM | POA: Insufficient documentation

## 2019-06-12 DIAGNOSIS — R151 Fecal smearing: Secondary | ICD-10-CM | POA: Insufficient documentation

## 2019-06-13 NOTE — Progress Notes (Signed)
____________________________________________________________  Attending physician addendum:  Thank you for sending this case to me. I have reviewed the entire note, and the outlined plan seems appropriate.  FODMAP and other dietary measures as well.  Wilfrid Lund, MD  ____________________________________________________________

## 2019-06-16 ENCOUNTER — Encounter: Payer: Self-pay | Admitting: Gastroenterology

## 2019-06-16 DIAGNOSIS — R140 Abdominal distension (gaseous): Secondary | ICD-10-CM | POA: Diagnosis not present

## 2019-06-26 ENCOUNTER — Telehealth: Payer: Self-pay

## 2019-06-26 MED ORDER — RIFAXIMIN 550 MG PO TABS: 550.0000 mg | ORAL_TABLET | Freq: Three times a day (TID) | ORAL | 0 refills | Status: AC

## 2019-06-26 NOTE — Telephone Encounter (Signed)
The pt has been advised and prescription sent to the pt pharmacy.  She will call back if the rx is too expensive.  I offered to send to [facility] but she preferred I send to her retail pharmacy first.

## 2019-06-26 NOTE — Telephone Encounter (Signed)
Left message on machine to call back  

## 2019-06-26 NOTE — Telephone Encounter (Signed)
-----   Message from Loralie Champagne, PA-C sent at 06/25/2019  5:36 PM EST ----- Please let the patient know that her SIBO test resulted as suspicion for bacterial overgrowth.  Please prescribe Xifaxan 550 mg 3 times daily for 14 days.  Have her call with update on her symptoms a few weeks after completing the course of medication.  I am sending the results to be scanned.  Thank you,  Jess

## 2019-07-18 ENCOUNTER — Telehealth: Payer: Self-pay

## 2019-07-18 DIAGNOSIS — M545 Low back pain, unspecified: Secondary | ICD-10-CM

## 2019-07-18 DIAGNOSIS — R102 Pelvic and perineal pain: Secondary | ICD-10-CM | POA: Diagnosis not present

## 2019-07-18 NOTE — Telephone Encounter (Signed)
Yes, I can do a CT scan.  I did go to my ON doc today.  He ruled out any obvious issues with my female anatomy.  No UTI.  He did some labs and will call me (thyroid and hormone levels).  He also did a rectal exam.  No fun.  Everything was normal.  Perhaps the scan would be helpful.  Fridays are my days off for scheduling.  Tomorrow, I have something in the morning but could possibly go in the early afternoon.  I would need to know how much it would be for me with my [employer] ins so I could plan.   You  Debra [name], Debra Lunger "Kathy" 50 minutes ago (2:42 PM)   Could be IBS related. Small intestinal bacterial overgrowth and IBS are associated with one another. Very unusual for you  to have the side effects with the Xifaxan as it is usually very well-tolerated. Nonetheless, how do you  feel about doing some type of imaging such as a CT scan of the abdomen and pelvis just to be sure there is nothing else going on?

## 2019-07-19 NOTE — Telephone Encounter (Signed)
You are scheduled on 08/02/19 at 1 pm. You should arrive 15 minutes prior to your appointment time for registration. Please follow the written instructions below on the day of your exam.   You will need to go to [place] at least 2 days prior to pick up contrast and instructions.     The pt has been advised via My Chart

## 2019-08-02 ENCOUNTER — Ambulatory Visit (HOSPITAL_COMMUNITY): Admission: RE | Admit: 2019-08-02 | Payer: BC Managed Care – PPO | Source: Ambulatory Visit

## 2019-08-02 ENCOUNTER — Telehealth: Payer: Self-pay | Admitting: Gastroenterology

## 2019-08-02 NOTE — Telephone Encounter (Signed)
Order has been faxed as requested and CT at [facility] cancelled.

## 2019-08-02 NOTE — Telephone Encounter (Signed)
[employer] calling- They stated that patient wants to switch the CT from [facility] to [facility] stating that it is cheaper out of pocket for her. They are asking for the order to be faxed to [facility]- Fax # [phone]. States to call patient back if there is any questions.

## 2019-08-23 ENCOUNTER — Ambulatory Visit
Admission: RE | Admit: 2019-08-23 | Discharge: 2019-08-23 | Disposition: A | Discharge status: Home / Self Care | Payer: BC Managed Care – PPO | Source: Ambulatory Visit | Attending: Gastroenterology | Admitting: Gastroenterology

## 2019-08-23 DIAGNOSIS — M545 Low back pain, unspecified: Secondary | ICD-10-CM

## 2019-08-23 DIAGNOSIS — D259 Leiomyoma of uterus, unspecified: Secondary | ICD-10-CM | POA: Diagnosis not present

## 2019-08-23 MED ORDER — IOPAMIDOL (ISOVUE-300) INJECTION 61%
100.0000 mL | Freq: Once | INTRAVENOUS | Status: AC | PRN
  Administered 2019-08-23: 100 mL via INTRAVENOUS

## 2019-08-23 MED ORDER — IOPAMIDOL (ISOVUE-300) INJECTION 61%: 100.0000 mL | Freq: Once | INTRAVENOUS | Status: DC | PRN

## 2019-08-27 ENCOUNTER — Other Ambulatory Visit: Payer: Self-pay | Admitting: Internal Medicine

## 2019-08-29 ENCOUNTER — Telehealth: Payer: Self-pay | Admitting: Cardiology

## 2019-08-29 DIAGNOSIS — E785 Hyperlipidemia, unspecified: Secondary | ICD-10-CM

## 2019-08-29 NOTE — Telephone Encounter (Signed)
I look at her static, true is that she does have likely 2 plaques in her abdominal aorta.  Also very small.  But obviously concerning.  I think the best approach to this will be to recheck her cholesterol and based on that decide which will be next course of action.  So please schedule her to have fasting lipid profile

## 2019-08-29 NOTE — Telephone Encounter (Signed)
Patient had an abdominal CT scan done by GI. She was reviewing results and noticed that it states she has some aortic atherosclerosis and was wondering if Dr. Agustin Cree could take a look at the scan and could provide any further information regarding this. I advised her that I would forward the message to Dr. Agustin Cree. Patient thanked me for the call.

## 2019-08-29 NOTE — Telephone Encounter (Signed)
° °  Pt is calling to follow up her question she sent through mychart on 08/27/19. She wanted to know if Dr. Agustin Cree can review her CT it was done on 08/23/19  Please advise

## 2019-08-30 NOTE — Telephone Encounter (Signed)
Patient returning call.

## 2019-08-30 NOTE — Telephone Encounter (Signed)
Called patient informed her that Dr. Agustin Cree looked over Ct and advised we check her cholesterol to be sure. She was informed to come have it drawn when she gets a chance. No further questions.

## 2019-08-30 NOTE — Telephone Encounter (Signed)
Left message for patient to return call.

## 2019-09-06 DIAGNOSIS — E785 Hyperlipidemia, unspecified: Secondary | ICD-10-CM | POA: Diagnosis not present

## 2019-09-07 LAB — LIPID PANEL
Chol/HDL Ratio: 3.1 ratio (ref 0.0–4.4)
Cholesterol, Total: 198 mg/dL (ref 100–199)
HDL: 64 mg/dL (ref >39)
LDL Chol Calc (NIH): 124 mg/dL — ABNORMAL HIGH (ref 0–99)
Triglycerides: 53 mg/dL (ref 0–149)
VLDL Cholesterol Cal: 10 mg/dL (ref 5–40)

## 2019-12-19 NOTE — Telephone Encounter (Signed)
Ok to do referral for leg pain  Circulatory evaluation but  Ithought dr Scot Dock did chest  Vascular  And not peripheral but can do referral  To see

## 2019-12-20 ENCOUNTER — Other Ambulatory Visit: Payer: Self-pay

## 2019-12-20 DIAGNOSIS — M79606 Pain in leg, unspecified: Secondary | ICD-10-CM

## 2020-01-09 DIAGNOSIS — Z01419 Encounter for gynecological examination (general) (routine) without abnormal findings: Secondary | ICD-10-CM | POA: Diagnosis not present

## 2020-01-09 DIAGNOSIS — Z6830 Body mass index (BMI) 30.0-30.9, adult: Secondary | ICD-10-CM | POA: Diagnosis not present

## 2020-01-09 DIAGNOSIS — Z1231 Encounter for screening mammogram for malignant neoplasm of breast: Secondary | ICD-10-CM | POA: Diagnosis not present

## 2020-01-24 ENCOUNTER — Other Ambulatory Visit: Payer: Self-pay | Admitting: Internal Medicine

## 2020-01-24 DIAGNOSIS — F419 Anxiety disorder, unspecified: Secondary | ICD-10-CM | POA: Diagnosis not present

## 2020-01-24 NOTE — Telephone Encounter (Signed)
So I agree they look like bug bites  Or bed bug bites or flea bites    I would use topical hcs and   Follow   Up if still having a problem in  2  week or so

## 2020-02-01 DIAGNOSIS — F419 Anxiety disorder, unspecified: Secondary | ICD-10-CM | POA: Diagnosis not present

## 2020-02-04 ENCOUNTER — Other Ambulatory Visit: Payer: Self-pay

## 2020-02-04 DIAGNOSIS — I8390 Asymptomatic varicose veins of unspecified lower extremity: Secondary | ICD-10-CM

## 2020-02-07 DIAGNOSIS — F419 Anxiety disorder, unspecified: Secondary | ICD-10-CM | POA: Diagnosis not present

## 2020-02-14 DIAGNOSIS — M84374A Stress fracture, right foot, initial encounter for fracture: Secondary | ICD-10-CM | POA: Diagnosis not present

## 2020-02-15 DIAGNOSIS — F419 Anxiety disorder, unspecified: Secondary | ICD-10-CM | POA: Diagnosis not present

## 2020-02-20 ENCOUNTER — Other Ambulatory Visit: Payer: Self-pay

## 2020-02-20 ENCOUNTER — Ambulatory Visit (HOSPITAL_COMMUNITY)
Admission: RE | Admit: 2020-02-20 | Discharge: 2020-02-20 | Disposition: A | Discharge status: Home / Self Care | Payer: BC Managed Care – PPO | Source: Ambulatory Visit | Attending: Vascular Surgery | Admitting: Vascular Surgery

## 2020-02-20 ENCOUNTER — Ambulatory Visit (INDEPENDENT_AMBULATORY_CARE_PROVIDER_SITE_OTHER): Payer: BC Managed Care – PPO | Admitting: Vascular Surgery

## 2020-02-20 ENCOUNTER — Encounter: Payer: Self-pay | Admitting: Vascular Surgery

## 2020-02-20 VITALS — BP 123/77 | HR 70 | Temp 98.4°F | Resp 16 | Ht 68.0 in | Wt 193.0 lb

## 2020-02-20 DIAGNOSIS — I83812 Varicose veins of left lower extremities with pain: Secondary | ICD-10-CM

## 2020-02-20 DIAGNOSIS — I8393 Asymptomatic varicose veins of bilateral lower extremities: Secondary | ICD-10-CM

## 2020-02-20 DIAGNOSIS — I8390 Asymptomatic varicose veins of unspecified lower extremity: Secondary | ICD-10-CM | POA: Insufficient documentation

## 2020-02-20 NOTE — Progress Notes (Signed)
REASON FOR CONSULT:    Painful varicose veins.  The consult is requested by Dr. Shanon Ace.   ASSESSMENT & PLAN:   PAINFUL VARICOSE VEINS: This patient has painful varicose veins of the left lower extremity.  She has CEAP C2 venous disease.She has significant superficial venous reflux in the great saphenous vein which is feeding this large cluster of varicosities in her medial left calf as documented below.  We have discussed the importance of intermittent leg elevation and the proper positioning for this.  In addition I have fitted her for thigh-high compression stockings with a gradient of 20 to 30 mmHg.  I have encouraged her to avoid prolonged sitting and standing.  We discussed importance of exercise specifically walking and water aerobics.  We also discussed the importance of nutrition given that central obesity especially increases lower extremity venous pressure.  I plan on seeing her back in 3 months.  If she is having persistent symptoms I think she would be a good candidate for laser ablation of the left great saphenous vein from the distal thigh to the saphenofemoral junction with 10-20 stab phlebectomies.  I will see her back in 3 months.  She knows to call sooner if she has problems.  Debra Mayo, MD Office: [phone]   HPI:   Debra [name] is a pleasant 54 y.o. female, who has a long history of varicose veins of the left lower extremity.  She has had these for many years but recently they have become more symptomatic.  She describes achiness and heaviness in her varicosities in the medial left calf.  The symptoms are aggravated by standing and relieved with elevation.  Her symptoms are worse at the end of the day.  She denies any previous venous procedures.  She is had no history of DVT.  She has not been wearing compression stockings.  She does elevate her legs which does help some.  She is not on any blood thinners.  She is otherwise healthy.  Past Medical  History:  Diagnosis Date  . Abdominal pain, epigastric 01/06/2010  . Chronic headaches   . Esophagitis   . ESOPHAGITIS, HX OF 02/18/2010  . EXTERNAL HEMORRHOIDS 02/18/2010  . FATIGUE 04/13/2007  . Gallstones   . GERD 03/13/2007  . Headache(784.0) 03/13/2007  . HIP PAIN, RIGHT 02/23/2009  . LIVER FUNCTION TESTS, ABNORMAL 01/06/2010  . LOW BACK PAIN, MILD 08/06/2008  . Ovarian cyst   . Pancreatitis 12/18/09  . PARESTHESIA 03/13/2007  . Pulmonary nodule    incidental on ct 2007  neg cxray 2011  . Ruptured ovarian cyst   . VARICOSE VEIN 01/06/2010    Family History  Problem Relation Age of Onset  . Alcohol abuse Father   . Cardiomyopathy Father   . Anemia Father   . Heart disease Father   . Other Father        Irrg. Heart Beat  . Lung cancer Mother   . Diabetes Mother   . Other Brother        Irr. Heart Beat  . Pancreatitis Maternal Grandfather   . Colon cancer Maternal Grandmother   . Esophageal cancer Neg Hx   . Rectal cancer Neg Hx   . Stomach cancer Neg Hx     SOCIAL HISTORY: Social History   Socioeconomic History  . Marital status: Married    Spouse name: Not on file  . Number of children: 0  . Years of education: Not on file  . Highest education  level: Not on file  Occupational History  . Occupation: PHARMACIST    Employer: [employer]  Tobacco Use  . Smoking status: Never Smoker  . Smokeless tobacco: Never Used  Vaping Use  . Vaping Use: Never used  Substance and Sexual Activity  . Alcohol use: Yes    Comment: rarely  . Drug use: Never  . Sexual activity: Not on file  Other Topics Concern  . Not on file  Social History Narrative   Dogs 2     married is a Software engineer never smoked.  Not working since [employer] from [employer]    hx fertility treatment    hh of 3   6- 8 hours  Sleep    3 diet coke per day   2 etoh per month    4 yoyear okd  adopted at 7 weeks  gracie    Building house                   Social Determinants of Health    Financial Resource Strain:   . Difficulty of Paying Living Expenses: Not on file  Food Insecurity:   . Worried About Charity fundraiser in the Last Year: Not on file  . Ran Out of Food in the Last Year: Not on file  Transportation Needs:   . Lack of Transportation (Medical): Not on file  . Lack of Transportation (Non-Medical): Not on file  Physical Activity:   . Days of Exercise per Week: Not on file  . Minutes of Exercise per Session: Not on file  Stress:   . Feeling of Stress : Not on file  Social Connections:   . Frequency of Communication with Friends and Family: Not on file  . Frequency of Social Gatherings with Friends and Family: Not on file  . Attends Religious Services: Not on file  . Active Member of Clubs or Organizations: Not on file  . Attends Archivist Meetings: Not on file  . Marital Status: Not on file  Intimate Partner Violence:   . Fear of Current or Ex-Partner: Not on file  . Emotionally Abused: Not on file  . Physically Abused: Not on file  . Sexually Abused: Not on file    Allergies  Allergen Reactions  . Gadolinium      Code: HIVES, Desc: Multihance--pt had hives, Onset Date: [id number] IVP Dye  . Penicillins     REACTION: rash  . Sulfonamide Derivatives     REACTION: rash    Current Outpatient Medications  Medication Sig Dispense Refill  . ibuprofen (ADVIL,MOTRIN) 200 MG tablet Take 600 mg by mouth every 6 (six) hours as needed for moderate pain.     . SUMAtriptan (IMITREX) 100 MG tablet TAKE 1 TABLET BY MOUTH IMMEDIATELY MAY REPEAT AFTER 2 HOURS--Schedule yearly visit with labs for refills. [phone] 9 tablet 0  . DEXILANT 60 MG capsule TAKE 1 CAPSULE(60 MG) BY MOUTH DAILY (Patient not taking: Reported on 02/20/2020) 30 capsule 0   Current Facility-Administered Medications  Medication Dose Route Frequency Provider Last Rate Last Admin  . 0.9 %  sodium chloride infusion  500 mL Intravenous Once Nelida Meuse III, MD         REVIEW OF SYSTEMS:  [X]  denotes positive finding, [ ]  denotes negative finding Cardiac  Comments:  Chest pain or chest pressure:    Shortness of breath upon exertion:    Short of breath when lying flat:    Irregular  heart rhythm:        Vascular    Pain in calf, thigh, or hip brought on by ambulation:    Pain in feet at night that wakes you up from your sleep:     Blood clot in your veins:    Leg swelling:         Pulmonary    Oxygen at home:    Productive cough:     Wheezing:         Neurologic    Sudden weakness in arms or legs:     Sudden numbness in arms or legs:     Sudden onset of difficulty speaking or slurred speech:    Temporary loss of vision in one eye:     Problems with dizziness:         Gastrointestinal    Blood in stool:     Vomited blood:         Genitourinary    Burning when urinating:     Blood in urine:        Psychiatric    Major depression:         Hematologic    Bleeding problems:    Problems with blood clotting too easily:        Skin    Rashes or ulcers:        Constitutional    Fever or chills:     PHYSICAL EXAM:   Vitals:   02/20/20 1440  BP: 123/77  Pulse: 70  Resp: 16  Temp: 98.4 F (36.9 C)  TempSrc: Temporal  SpO2: 99%  Weight: 193 lb (87.5 kg)  Height: 5\' 8"  (1.727 m)    GENERAL: The patient is a well-nourished female, in no acute distress. The vital signs are documented above. CARDIAC: There is a regular rate and rhythm.  VASCULAR: I do not detect carotid bruits. She has palpable pedal pulses. She has a large dilated varicose veins in her medial left calf as documented below.   I looked at her left great saphenous vein myself with the SonoSite.  She has significant reflux from the saphenofemoral junction to the distal thigh.  The diameters of the vein ranged from 0.62-0.66 cm.  I think we could cannulate the vein just above the knee. PULMONARY: There is good air exchange bilaterally without wheezing or  rales. ABDOMEN: Soft and non-tender with normal pitched bowel sounds.  MUSCULOSKELETAL: There are no major deformities or cyanosis. NEUROLOGIC: No focal weakness or paresthesias are detected. SKIN: There are no ulcers or rashes noted. PSYCHIATRIC: The patient has a normal affect.  DATA:    VENOUS DUPLEX: I have independently interpreted the venous duplex scan of the left lower extremity.  On the left side, there is no evidence of DVT or superficial venous thrombosis.  There is deep venous reflux involving the common femoral vein.  There is superficial venous reflux in the left great saphenous vein from the saphenofemoral junction to the knee.  The diameters of the vein ranged from 0.62-0.66 cm.  There is no significant reflux in the small saphenous vein.

## 2020-02-21 DIAGNOSIS — M84374D Stress fracture, right foot, subsequent encounter for fracture with routine healing: Secondary | ICD-10-CM | POA: Diagnosis not present

## 2020-03-06 DIAGNOSIS — M84374D Stress fracture, right foot, subsequent encounter for fracture with routine healing: Secondary | ICD-10-CM | POA: Diagnosis not present

## 2020-03-13 DIAGNOSIS — F419 Anxiety disorder, unspecified: Secondary | ICD-10-CM | POA: Diagnosis not present

## 2020-03-17 DIAGNOSIS — Z23 Encounter for immunization: Secondary | ICD-10-CM | POA: Diagnosis not present

## 2020-03-20 DIAGNOSIS — F419 Anxiety disorder, unspecified: Secondary | ICD-10-CM | POA: Diagnosis not present

## 2020-03-20 DIAGNOSIS — M84374D Stress fracture, right foot, subsequent encounter for fracture with routine healing: Secondary | ICD-10-CM | POA: Diagnosis not present

## 2020-03-27 DIAGNOSIS — F419 Anxiety disorder, unspecified: Secondary | ICD-10-CM | POA: Diagnosis not present

## 2020-04-03 IMAGING — DX CHEST - 2 VIEW
2 series · 2 of 2 positions shown · non-contrast
Comparison: Radiographs August 07, 2017.

CLINICAL DATA: Pain under left hemidiaphragm.  Cough.

EXAM:
CHEST - 2 VIEW

[chest pa]
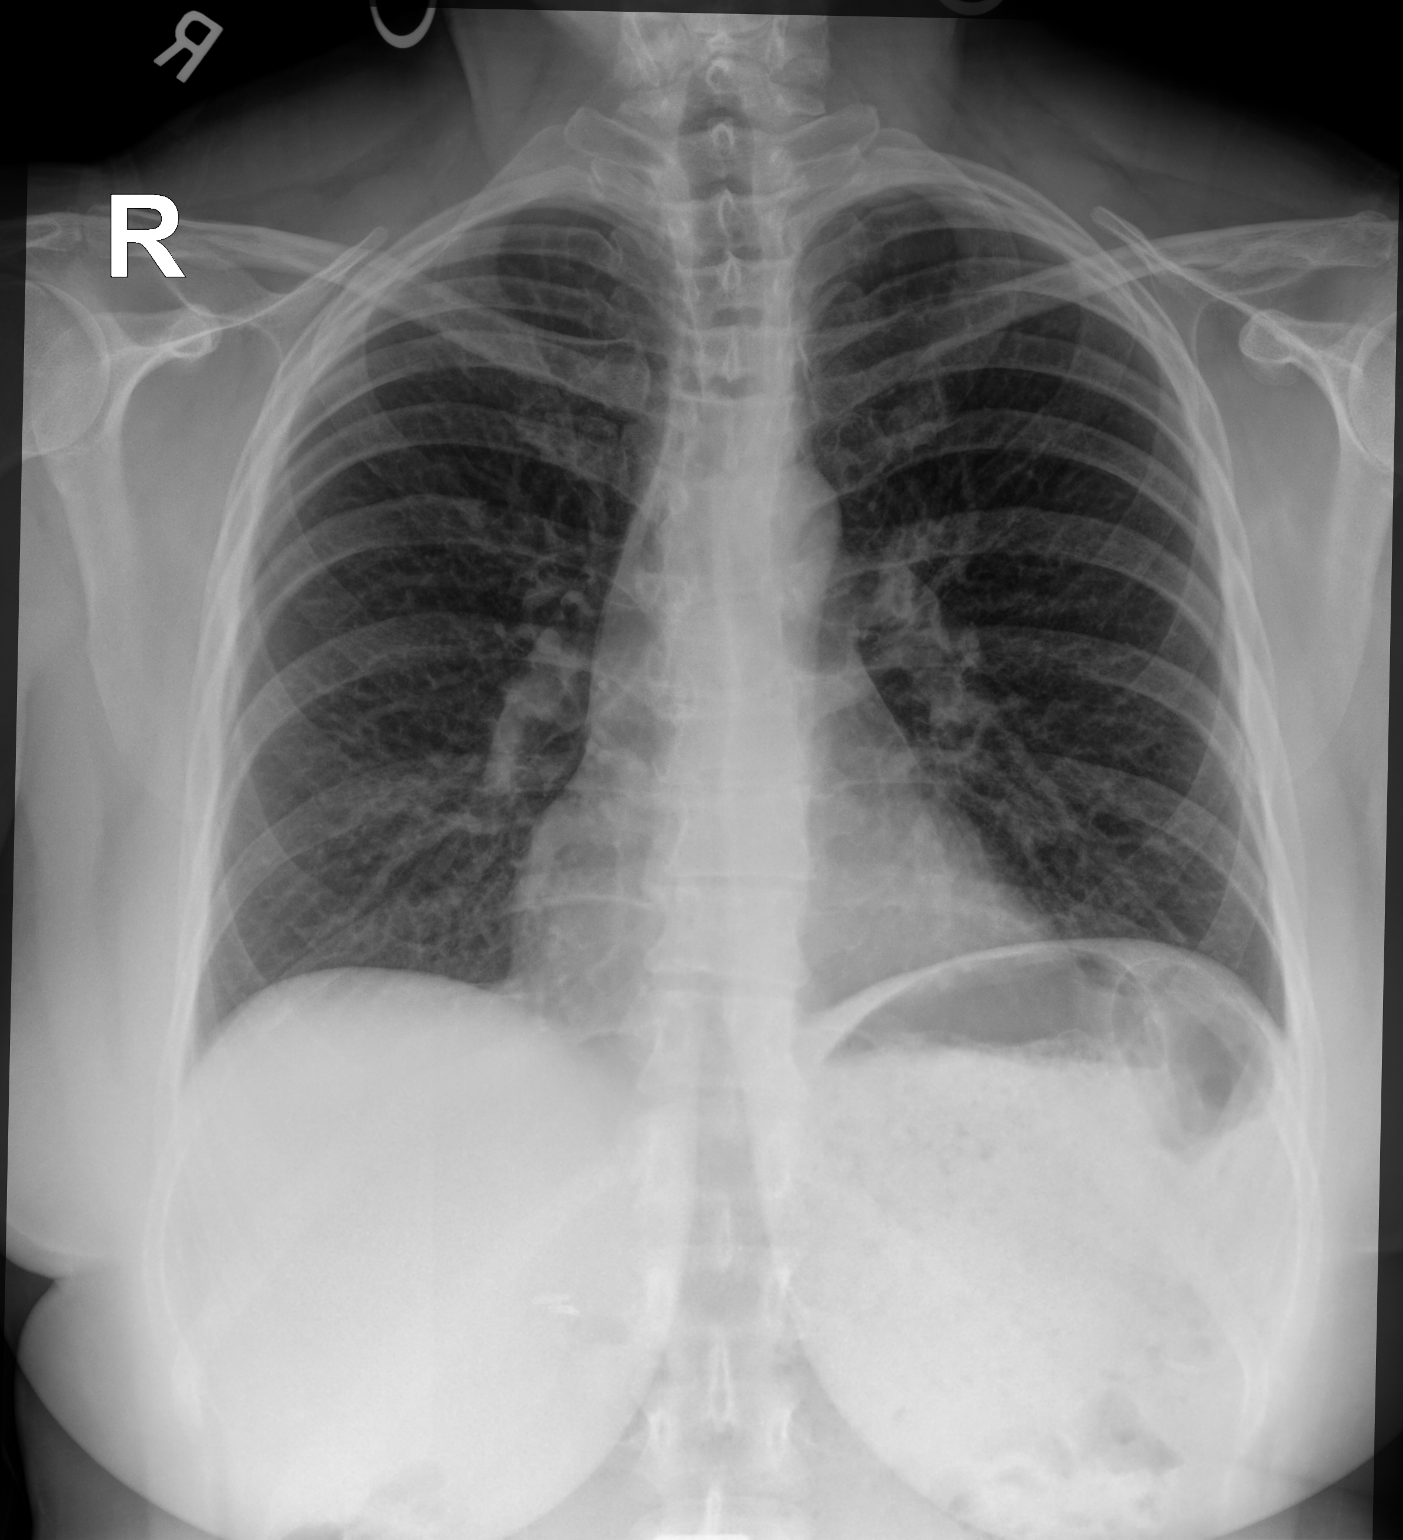

[chest lat]
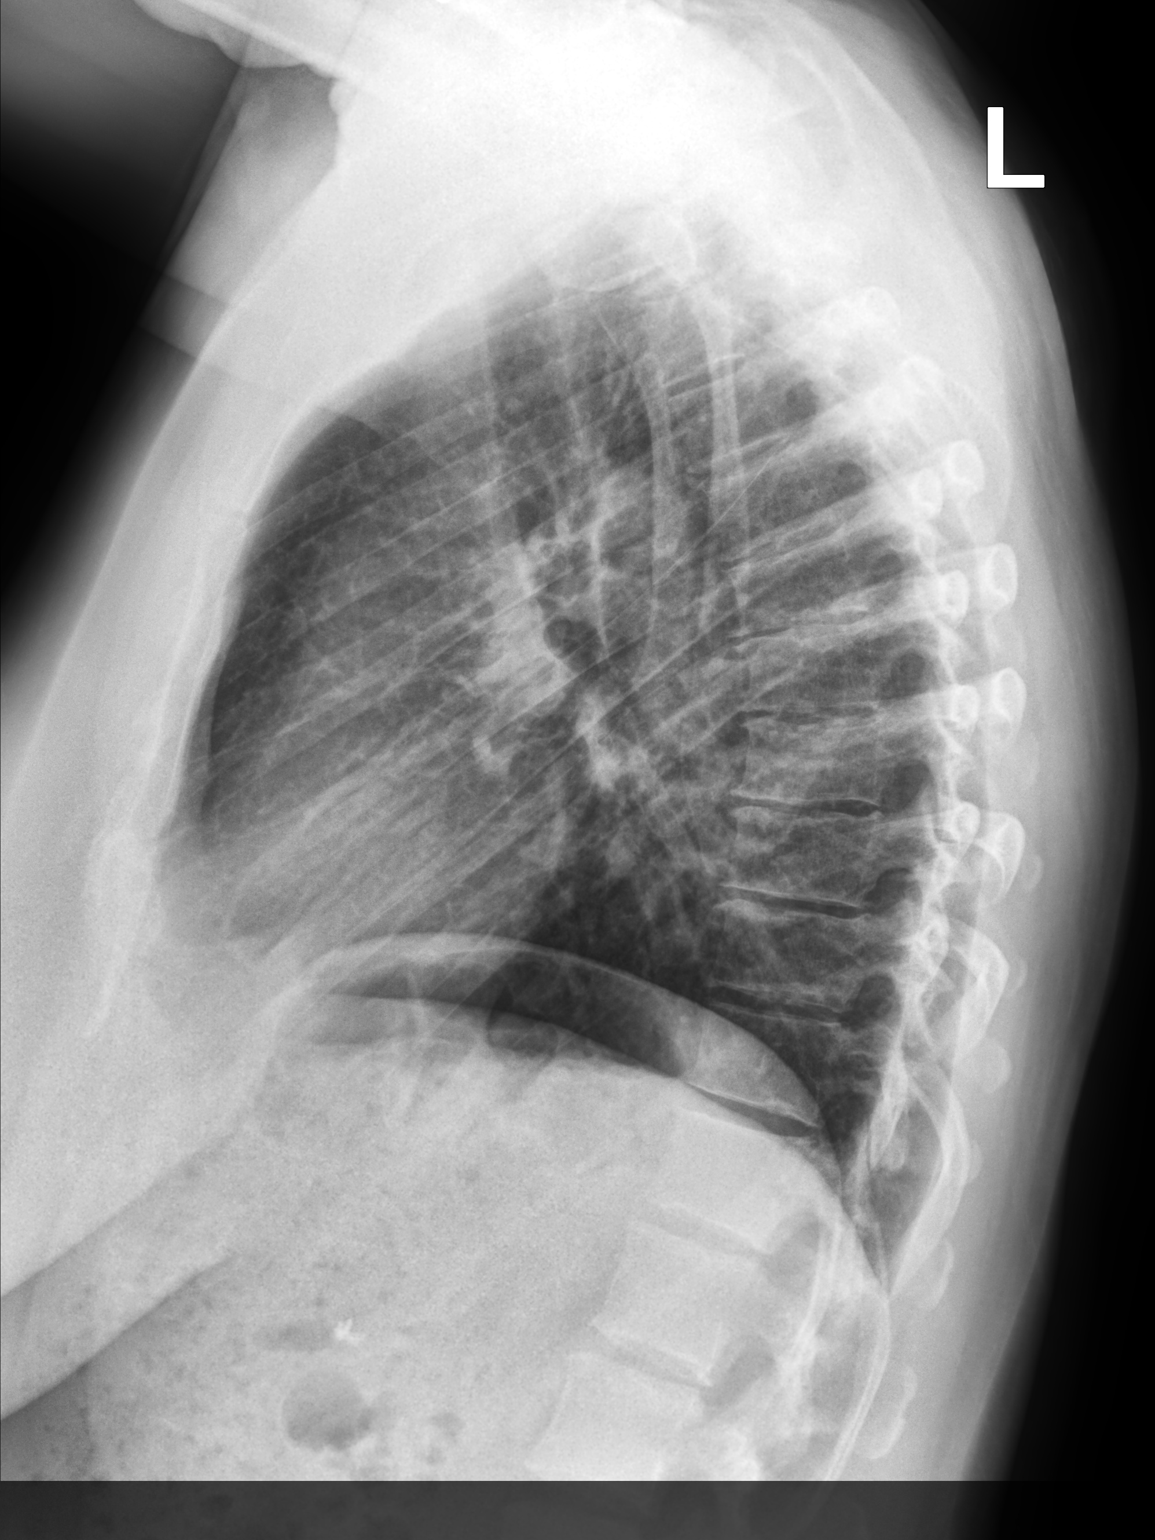

[2 of 2 positions shown; findings below may reference images not displayed]

FINDINGS: The heart size and mediastinal contours are within normal limits.
Both lungs are clear. No pneumothorax or pleural effusion is noted.
The visualized skeletal structures are unremarkable.
IMPRESSION: No active cardiopulmonary disease.

## 2020-04-10 ENCOUNTER — Other Ambulatory Visit: Payer: Self-pay | Admitting: Internal Medicine

## 2020-04-10 ENCOUNTER — Other Ambulatory Visit: Payer: Self-pay

## 2020-04-10 DIAGNOSIS — F419 Anxiety disorder, unspecified: Secondary | ICD-10-CM | POA: Diagnosis not present

## 2020-04-10 MED ORDER — SUMATRIPTAN SUCCINATE 100 MG PO TABS: ORAL_TABLET | ORAL | 0 refills | Status: DC

## 2020-04-21 NOTE — Telephone Encounter (Signed)
Sorry  You have covid   fortunately you do not have serious risk factors for complications. And most  People get better  Without complications  Yes  Supportive  Care  consider adding vit d and saline nasal wash but not robust data that will help .  if severe symptom s let us know    FYI    [facility] infusion   ( to be done in first 7-10 days or sx, in high risk outpatient setting)   If you  Have Serious short of breath   Seek Ed care  If needed you can make a video visit  To discuss questions .

## 2020-05-08 DIAGNOSIS — F419 Anxiety disorder, unspecified: Secondary | ICD-10-CM | POA: Diagnosis not present

## 2020-05-22 DIAGNOSIS — F419 Anxiety disorder, unspecified: Secondary | ICD-10-CM | POA: Diagnosis not present

## 2020-05-28 ENCOUNTER — Other Ambulatory Visit: Payer: Self-pay

## 2020-05-28 ENCOUNTER — Ambulatory Visit: Payer: BC Managed Care – PPO | Admitting: Vascular Surgery

## 2020-05-28 ENCOUNTER — Encounter: Payer: Self-pay | Admitting: Vascular Surgery

## 2020-05-28 VITALS — BP 113/71 | HR 74 | Temp 98.2°F | Resp 16 | Ht 68.0 in | Wt 195.0 lb

## 2020-05-28 DIAGNOSIS — I83812 Varicose veins of left lower extremities with pain: Secondary | ICD-10-CM

## 2020-05-28 NOTE — Progress Notes (Signed)
REASON FOR VISIT:   58-month follow-up visit  MEDICAL ISSUES:   PAINFUL VARICOSE VEINS: This patient has painful varicose veins of the left lower extremity.  She has failed conservative treatment including leg elevation, thigh-high compression stockings, and ibuprofen as needed for pain.  She also exercises and tries to avoid prolonged sitting and standing.  I think she would be a good candidate for laser ablation of the left great saphenous vein from the saphenofemoral junction of the distal thigh.  In addition she would require 10-20 stab phlebectomies.  I have discussed the indications for endovenous laser ablation of the left GSV, that is to lower the pressure in the veins and potentially help relieve the symptoms from venous hypertension. I have also discussed alternative options including conservative treatment with leg elevation, compression therapy, exercise, avoiding prolonged sitting and standing, and weight management. I have discussed the potential complications of the procedure, including, but not limited to: bleeding, bruising, leg swelling, nerve injury, skin burns, significant pain from phlebitis, deep venous thrombosis, or failure of the vein to close.  I have also explained that venous insufficiency is a chronic disease, and that the patient is at risk for recurrent varicose veins in the future.  All of the patient's questions were encouraged and answered. They are agreeable to proceed.   I have discussed with the patient the indications for stab phlebectomy.  I have explained to the patient that that will have small scars from the stab incisions.  I explained that the other risks include leg swelling, bruising, bleeding, and phlebitis.  All the patient's questions were encouraged and answered and they are agreeable to proceed.   HPI:   Debra [name] is a pleasant 55 y.o. female who I saw in consultation on 02/20/2020 with painful varicose veins of the left lower extremity.   Patient had CEAP C2 venous disease.  She was found to have significant superficial venous reflux in the great saphenous vein which was feeding a large cluster of varicosities in her medial left calf.  We discussed the importance of intermittent leg elevation.  I fitted her for some thigh-high compression stockings with a gradient of 20 to 30 mmHg.  We discussed the importance of trying to avoid prolonged sitting and standing and we discussed the importance of exercise.  She comes in for 41-month follow-up visit.  I felt that if her symptoms persisted she would be a good candidate for laser ablation of the left great saphenous vein from the distal thigh to the saphenofemoral junction with 10-20 stabs.  Since I saw her last, she continues to have aching pain and heaviness in the right leg which is aggravated by standing and relieved with elevation.  She has tried the thigh-high compression stockings which helps some but she continues to have persistent symptoms.  She said no previous history of DVT and no previous venous procedures.  Past Medical History:  Diagnosis Date  . Abdominal pain, epigastric 01/06/2010  . Chronic headaches   . Esophagitis   . ESOPHAGITIS, HX OF 02/18/2010  . EXTERNAL HEMORRHOIDS 02/18/2010  . FATIGUE 04/13/2007  . Gallstones   . GERD 03/13/2007  . Headache(784.0) 03/13/2007  . HIP PAIN, RIGHT 02/23/2009  . LIVER FUNCTION TESTS, ABNORMAL 01/06/2010  . LOW BACK PAIN, MILD 08/06/2008  . Ovarian cyst   . Pancreatitis 12/18/09  . PARESTHESIA 03/13/2007  . Pulmonary nodule    incidental on ct 2007  neg cxray 2011  . Ruptured ovarian cyst   .  VARICOSE VEIN 01/06/2010    Family History  Problem Relation Age of Onset  . Alcohol abuse Father   . Cardiomyopathy Father   . Anemia Father   . Heart disease Father   . Other Father        Irrg. Heart Beat  . Lung cancer Mother   . Diabetes Mother   . Other Brother        Irr. Heart Beat  . Pancreatitis Maternal Grandfather   .  Colon cancer Maternal Grandmother   . Esophageal cancer Neg Hx   . Rectal cancer Neg Hx   . Stomach cancer Neg Hx     SOCIAL HISTORY: Social History   Tobacco Use  . Smoking status: Never Smoker  . Smokeless tobacco: Never Used  Substance Use Topics  . Alcohol use: Yes    Comment: rarely    Allergies  Allergen Reactions  . Gadolinium      Code: HIVES, Desc: Multihance--pt had hives, Onset Date: [id number] IVP Dye  . Penicillins     REACTION: rash  . Sulfonamide Derivatives     REACTION: rash    Current Outpatient Medications  Medication Sig Dispense Refill  . ibuprofen (ADVIL,MOTRIN) 200 MG tablet Take 600 mg by mouth every 6 (six) hours as needed for moderate pain.    . SUMAtriptan (IMITREX) 100 MG tablet TAKE 1 TABLET BY MOUTH IMMEDIATELY MAY REPEAT AFTER 2 HOURS--Schedule yearly visit with labs for refills. [phone] 9 tablet 0  . DEXILANT 60 MG capsule TAKE 1 CAPSULE(60 MG) BY MOUTH DAILY (Patient not taking: No sig reported) 30 capsule 0   Current Facility-Administered Medications  Medication Dose Route Frequency Provider Last Rate Last Admin  . 0.9 %  sodium chloride infusion  500 mL Intravenous Once Nelida Meuse III, MD        REVIEW OF SYSTEMS:  [X]  denotes positive finding, [ ]  denotes negative finding Cardiac  Comments:  Chest pain or chest pressure:    Shortness of breath upon exertion:    Short of breath when lying flat:    Irregular heart rhythm:        Vascular    Pain in calf, thigh, or hip brought on by ambulation:    Pain in feet at night that wakes you up from your sleep:     Blood clot in your veins:    Leg swelling:  x       Pulmonary    Oxygen at home:    Productive cough:     Wheezing:         Neurologic    Sudden weakness in arms or legs:     Sudden numbness in arms or legs:     Sudden onset of difficulty speaking or slurred speech:    Temporary loss of vision in one eye:     Problems with dizziness:         Gastrointestinal     Blood in stool:     Vomited blood:         Genitourinary    Burning when urinating:     Blood in urine:        Psychiatric    Major depression:         Hematologic    Bleeding problems:    Problems with blood clotting too easily:        Skin    Rashes or ulcers:        Constitutional    Fever or  chills:     PHYSICAL EXAM:   Vitals:   05/28/20 1354  BP: 113/71  Pulse: 74  Resp: 16  Temp: 98.2 F (36.8 C)  TempSrc: Temporal  SpO2: 97%  Weight: 195 lb (88.5 kg)  Height: 5\' 8"  (1.727 m)    GENERAL: The patient is a well-nourished female, in no acute distress. The vital signs are documented above. CARDIAC: There is a regular rate and rhythm.  VASCULAR: She has palpable pedal pulses. She has dilated veins in her medial left calf as documented in her previous photograph from her last visit. I looked at her left great saphenous vein myself with the SonoSite.  Again this vein is dilated throughout with significant reflux from the saphenofemoral junction to the distal thigh.  I think we could cannulate the vein in the distal thigh just above the knee. PULMONARY: There is good air exchange bilaterally without wheezing or rales. ABDOMEN: Soft and non-tender with normal pitched bowel sounds.  MUSCULOSKELETAL: There are no major deformities or cyanosis. NEUROLOGIC: No focal weakness or paresthesias are detected. SKIN: There are no ulcers or rashes noted. PSYCHIATRIC: The patient has a normal affect.  DATA:    VENOUS DUPLEX: I reviewed her venous duplex scan that was done in September.  On the left side there was no evidence of DVT or superficial venous thrombosis.  There was deep venous reflux involving the common femoral vein.  There was reflux in the left great saphenous vein from the saphenofemoral junction to the knee.  Diameters of the vein ranged from 0.62-0.66 cm.  [name] [facility] [phone]

## 2020-05-29 DIAGNOSIS — F419 Anxiety disorder, unspecified: Secondary | ICD-10-CM | POA: Diagnosis not present

## 2020-06-03 ENCOUNTER — Encounter: Payer: Self-pay | Admitting: Vascular Surgery

## 2020-06-03 ENCOUNTER — Other Ambulatory Visit: Payer: Self-pay | Admitting: *Deleted

## 2020-06-03 DIAGNOSIS — I83812 Varicose veins of left lower extremities with pain: Secondary | ICD-10-CM

## 2020-06-05 DIAGNOSIS — F419 Anxiety disorder, unspecified: Secondary | ICD-10-CM | POA: Diagnosis not present

## 2020-06-11 ENCOUNTER — Other Ambulatory Visit: Payer: Self-pay | Admitting: Internal Medicine

## 2020-06-12 DIAGNOSIS — F419 Anxiety disorder, unspecified: Secondary | ICD-10-CM | POA: Diagnosis not present

## 2020-06-18 ENCOUNTER — Other Ambulatory Visit: Payer: Self-pay | Admitting: Internal Medicine

## 2020-06-19 ENCOUNTER — Other Ambulatory Visit: Payer: Self-pay | Admitting: *Deleted

## 2020-06-19 MED ORDER — LORAZEPAM 1 MG PO TABS: ORAL_TABLET | ORAL | 0 refills | Status: DC

## 2020-06-25 ENCOUNTER — Other Ambulatory Visit: Payer: Self-pay

## 2020-06-26 ENCOUNTER — Other Ambulatory Visit: Payer: Self-pay

## 2020-06-26 DIAGNOSIS — F419 Anxiety disorder, unspecified: Secondary | ICD-10-CM | POA: Diagnosis not present

## 2020-06-26 NOTE — Telephone Encounter (Signed)
Follow up is scheduled for 08/19/2020

## 2020-06-28 MED ORDER — SUMATRIPTAN SUCCINATE 100 MG PO TABS: ORAL_TABLET | ORAL | 0 refills | Status: DC

## 2020-07-02 ENCOUNTER — Encounter: Payer: Self-pay | Admitting: Vascular Surgery

## 2020-07-02 ENCOUNTER — Ambulatory Visit: Payer: BC Managed Care – PPO | Admitting: Vascular Surgery

## 2020-07-02 ENCOUNTER — Other Ambulatory Visit: Payer: Self-pay

## 2020-07-02 VITALS — BP 113/70 | HR 62 | Temp 97.9°F | Resp 16 | Ht 68.0 in | Wt 195.0 lb

## 2020-07-02 DIAGNOSIS — I83812 Varicose veins of left lower extremities with pain: Secondary | ICD-10-CM

## 2020-07-02 HISTORY — PX: ENDOVENOUS ABLATION SAPHENOUS VEIN W/ LASER: SUR449

## 2020-07-02 NOTE — Progress Notes (Signed)
Patient name: Debra [name] MRN: [id number] DOB: 1965-05-30 Sex: female  REASON FOR VISIT: For laser ablation of the left great saphenous vein with 10-20 stabs  HPI: Debra [name] is a 55 y.o. female White seen with painful varicose veins in the left lower extremity.  She had failed conservative treatment and I felt she would be a good candidate for laser ablation of the left great saphenous vein with 10-20 stabs.  She has a large cluster varicose veins in her medial left calf which are under significant pressure.  The diameters of her saphenous vein ranged from 0.62-0.66 cm.  Current Outpatient Medications  Medication Sig Dispense Refill  . DEXILANT 60 MG capsule TAKE 1 CAPSULE(60 MG) BY MOUTH DAILY (Patient not taking: No sig reported) 30 capsule 0  . ibuprofen (ADVIL,MOTRIN) 200 MG tablet Take 600 mg by mouth every 6 (six) hours as needed for moderate pain.    Marland Kitchen LORazepam (ATIVAN) 1 MG tablet Take 1 tablet 30 minutes prior to leaving house on day of office surgery and bring second tablet with you to the office. 2 tablet 0  . SUMAtriptan (IMITREX) 100 MG tablet TAKE 1 TABLET BY MOUTH IMMEDIATELY MAY REPEAT AFTER 2 HOURS--Schedule yearly visit with labs for refills. [phone] 9 tablet 0   Current Facility-Administered Medications  Medication Dose Route Frequency Provider Last Rate Last Admin  . 0.9 %  sodium chloride infusion  500 mL Intravenous Once Nelida Meuse III, MD        PHYSICAL EXAM: Vitals:   07/02/20 1138  BP: 113/70  Pulse: 62  Resp: 16  Temp: 97.9 F (36.6 C)  TempSrc: Temporal  SpO2: 100%  Weight: 195 lb (88.5 kg)  Height: 5\' 8"  (1.727 m)    PROCEDURE: Laser ablation left great saphenous vein with 10-20 stabs  TECHNIQUE: The patient was taken to the exam room and her dilated veins in the left leg were marked with the patient standing.  The patient was then placed supine.  I looked at the left great saphenous vein myself with the SonoSite and I felt  that I could cannulate this in the distal thigh.  The left leg was prepped and draped in usual sterile fashion.  Under ultrasound guidance, after the skin was anesthetized, I cannulated the left great saphenous vein with a micropuncture needle and a micropuncture sheath was introduced over a wire.  I then advanced the J-wire to just below the saphenofemoral junction.  65 cm sheath was advanced over the wire.  This was positioned just below the saphenofemoral junction.  I then positioned the laser fiber at the end of the sheath and the sheath was retracted.  Laser fiber was positioned 2.5 cm distal to the saphenofemoral junction.  Tumescent anesthesia was then administered circumferentially around the vein.  Laser ablation was then performed of the left great saphenous vein from 2.5 cm distal to the saphenofemoral junction to the distal thigh.  Used 50 J/cm at 7 W.   Next attention was turned to stab phlebectomies.  All the areas that were marked were infiltrated with tumescent anesthesia.  Using approximately 15 small stab incisions the vein was grasped with a small hook brought above the skin and grasped with a hemostat.  The veins were bluntly excised.  Pressure was held for hemostasis.  A pressure dressing was applied.  Patient tolerated the procedure well and will return in 1 week for a follow-up duplex.  [name] [facility]  [phone]    

## 2020-07-02 NOTE — Progress Notes (Signed)
     Laser Ablation Procedure    Date: 07/02/2020   Debra [name] DOB:06-Jun-1965  Consent signed: Yes      Surgeon: Gae Gallop MD   Procedure: Laser Ablation: left Greater Saphenous Vein  BP 113/70 (BP Location: Left Arm, Patient Position: Sitting, Cuff Size: Normal)   Pulse 62   Temp 97.9 F (36.6 C) (Temporal)   Resp 16   Ht 5\' 8"  (1.727 m)   Wt 195 lb (88.5 kg)   SpO2 100%   BMI 29.65 kg/m   Tumescent Anesthesia: 450 cc 0.9% NaCl with 50 cc Lidocaine HCL 1%  and 15 cc 8.4% NaHCO3  Local Anesthesia: 5 cc Lidocaine HCL and NaHCO3 (ratio 2:1)  7 watts continuous mode     Total energy: 1557 Joules    Total time: 222 seconds Treatment Length 34 cm  Laser Fiber Ref. # [id number]    Lot #  [id number]   Stab Phlebectomy: 10-20 Sites: Calf and Ankle  Patient tolerated procedure well  Notes: Patient wore face mask.  All staff members wore facial masks and facial shields/goggles.  Ms. Liptak took Ativan 1 mg at 10:00 AM and at 11:30 AM on 07-02-2020.    Description of Procedure:  After marking the course of the secondary varicosities, the patient was placed on the operating table in the supine position, and the left leg was prepped and draped in sterile fashion.   Local anesthetic was administered and under ultrasound guidance the saphenous vein was accessed with a micro needle and guide wire; then the mirco puncture sheath was placed.  A guide wire was inserted saphenofemoral junction , followed by a 5 french sheath.  The position of the sheath and then the laser fiber below the junction was confirmed using the ultrasound.  Tumescent anesthesia was administered along the course of the saphenous vein using ultrasound guidance. The patient was placed in Trendelenburg position and protective laser glasses were placed on patient and staff, and the laser was fired at 7 watts continuous mode for a total of 1557 joules.   For stab phlebectomies, local anesthetic was administered  at the previously marked varicosities, and tumescent anesthesia was administered around the vessels.  Ten to 20 stab wounds were made using the tip of an 11 blade. And using the vein hook, the phlebectomies were performed using a hemostat to avulse the varicosities.  Adequate hemostasis was achieved.     Steri strips were applied to the stab wounds and ABD pads and thigh high compression stockings were applied.  Ace wrap bandages were applied over the phlebectomy sites and at the top of the saphenofemoral junction. Blood loss was less than 15 cc.  Discharge instructions reviewed with patient and hardcopy of discharge instructions given to patient to take home. The patient ambulated out of the operating room having tolerated the procedure well.

## 2020-07-06 ENCOUNTER — Telehealth: Payer: Self-pay

## 2020-07-06 NOTE — Telephone Encounter (Signed)
Patient sent MyChart message concerned about an area about the size of a quarter under a stab phlebectomy site that is more sore than the other areas. It is localized to that area. Advised patient to continue to elevate extremity and keep f/u appt with CSD on Thursday. She will call if further issues.

## 2020-07-08 ENCOUNTER — Telehealth: Payer: Self-pay

## 2020-07-08 NOTE — Telephone Encounter (Signed)
Discussed patient's concerns with [facility], she is s/p laser ablation and stab phlebectomies. She has a localized area of redness that is warm to the touch and swollen about the size of a quarter. It is painful. She denies leg or foot swelling. MD says it sounds like phlebitis, and patient should try warm compresses, elevation, and ibuprofen. Called patient and advised her of CSD's recommendations. Patient verbalizes understanding. She will have follow up appt and duplex tomorrow.

## 2020-07-09 ENCOUNTER — Ambulatory Visit (HOSPITAL_COMMUNITY)
Admission: RE | Admit: 2020-07-09 | Discharge: 2020-07-09 | Disposition: A | Discharge status: Home / Self Care | Payer: BC Managed Care – PPO | Source: Ambulatory Visit | Attending: Vascular Surgery | Admitting: Vascular Surgery

## 2020-07-09 ENCOUNTER — Ambulatory Visit: Payer: Self-pay | Admitting: Vascular Surgery

## 2020-07-09 ENCOUNTER — Other Ambulatory Visit: Payer: Self-pay

## 2020-07-09 ENCOUNTER — Encounter: Payer: Self-pay | Admitting: Vascular Surgery

## 2020-07-09 VITALS — BP 112/71 | HR 77 | Temp 98.2°F | Resp 14

## 2020-07-09 DIAGNOSIS — I83812 Varicose veins of left lower extremities with pain: Secondary | ICD-10-CM | POA: Diagnosis not present

## 2020-07-09 DIAGNOSIS — I8393 Asymptomatic varicose veins of bilateral lower extremities: Secondary | ICD-10-CM

## 2020-07-09 NOTE — Progress Notes (Signed)
   Patient name: Debra [name] MRN: [id number] DOB: April 14, 1966 Sex: female  REASON FOR VISIT: Follow-up after laser ablation of the left great saphenous vein and 10-20 stabs  HPI: Debra [name] is a 55 y.o. female who had presented with painful varicose veins of her left lower extremity.  She failed conservative treatment and was felt to be a candidate for laser ablation of the left great saphenous vein with 10-20 stabs.  This was performed on 07/02/2020.  The vein was treated from 2-1/2 cm distal to the saphenofemoral junction to the distal thigh.  She comes in for a 1 week follow-up visit.  Her only complaint is some tenderness in the proximal calf medially.  The pain is better today.  Of note she is back at work and on her feet quite a bit and this may be contributing some to this.  Current Outpatient Medications  Medication Sig Dispense Refill  . ibuprofen (ADVIL,MOTRIN) 200 MG tablet Take 600 mg by mouth every 6 (six) hours as needed for moderate pain.    . SUMAtriptan (IMITREX) 100 MG tablet TAKE 1 TABLET BY MOUTH IMMEDIATELY MAY REPEAT AFTER 2 HOURS--Schedule yearly visit with labs for refills. [phone] 9 tablet 0  . DEXILANT 60 MG capsule TAKE 1 CAPSULE(60 MG) BY MOUTH DAILY (Patient not taking: No sig reported) 30 capsule 0  . LORazepam (ATIVAN) 1 MG tablet Take 1 tablet 30 minutes prior to leaving house on day of office surgery and bring second tablet with you to the office. (Patient not taking: Reported on 07/09/2020) 2 tablet 0   Current Facility-Administered Medications  Medication Dose Route Frequency Provider Last Rate Last Admin  . 0.9 %  sodium chloride infusion  500 mL Intravenous Once Nelida Meuse III, MD       REVIEW OF SYSTEMS: Valu.Nieves ] denotes positive finding; [  ] denotes negative finding  CARDIOVASCULAR:  [ ]  chest pain   [ ]  dyspnea on exertion  [ ]  leg swelling  CONSTITUTIONAL:  [ ]  fever   [ ]  chills  PHYSICAL EXAM: Vitals:   07/09/20 1027  BP: 112/71   Pulse: 77  Resp: 14  Temp: 98.2 F (36.8 C)  TempSrc: Temporal  SpO2: 98%   GENERAL: The patient is a well-nourished female, in no acute distress. The vital signs are documented above. CARDIOVASCULAR: There is a regular rate and rhythm. PULMONARY: There is good air exchange bilaterally without wheezing or rales. VASCULAR: She has some mild bruising in the proximal calf.  The Steri-Strips are intact.  There is no significant leg swelling.  DATA:  VENOUS DUPLEX: I have independently interpreted her venous duplex scan today.  There is no evidence of DVT.  The left great saphenous vein is successfully closed from the saphenofemoral junction to the proximal calf.  MEDICAL ISSUES:  S/P LASER ABLATION LEFT GREAT SAPHENOUS VEIN AND 10-20 STABS: The patient has undergone successful and ablation of the left great saphenous vein with 10-20 stabs.  She has some tenderness just below the knee where by ultrasound there is a small area of phlebitis.  I instructed her to elevate her leg, take ibuprofen, and use a warm compress.  This should settle down the inflammation I suspect this will take a couple weeks to resolve.  She will call if this is not improving.  I will see her back as needed otherwise  [name] [facility] [place]

## 2020-07-16 ENCOUNTER — Telehealth: Payer: Self-pay

## 2020-07-16 NOTE — Telephone Encounter (Signed)
Called pt after showing MD the MyChart pictures she sent [place] yesterday. Pt has been advised to use warm compress, elevate the leg and take Ibuprofen as needed, per MD. Pt feels like it starting to improve and will call us back if it does not continue to resolve or if anything changes. Pt verbalized understanding; no further questions/concerns at this time.

## 2020-07-24 DIAGNOSIS — F419 Anxiety disorder, unspecified: Secondary | ICD-10-CM | POA: Diagnosis not present

## 2020-07-31 DIAGNOSIS — F419 Anxiety disorder, unspecified: Secondary | ICD-10-CM | POA: Diagnosis not present

## 2020-08-07 DIAGNOSIS — F419 Anxiety disorder, unspecified: Secondary | ICD-10-CM | POA: Diagnosis not present

## 2020-08-14 DIAGNOSIS — F419 Anxiety disorder, unspecified: Secondary | ICD-10-CM | POA: Diagnosis not present

## 2020-08-18 NOTE — Progress Notes (Signed)
Chief Complaint  Patient presents with  . Annual Exam    HPI: Patient  Debra [name]  55 y.o. comes in today for Preventive Health Care visit  Generally well. Migraines  Takes week of menses  Controlled otherwise .  concern aobut weight control  periods  Ok sees Dr Louie Casa al Had VV  Treatment left leg Only prn dexilant  Health Maintenance  Topic Date Due  . TETANUS/TDAP  Never done  . MAMMOGRAM  Never done  . PAP SMEAR-Modifier  03/13/2017  . COVID-19 Vaccine (3 - Booster for Moderna series) 12/10/2019  . INFLUENZA VACCINE  12/21/2020  . COLONOSCOPY (Pts 45-34yrs Insurance coverage will need to be confirmed)  12/06/2028  . Hepatitis C Screening  Completed  . HIV Screening  Completed  . HPV VACCINES  Aged Out   Health Maintenance Review LIFESTYLE:  Exercise:   Walk 4 d other  Tobacco/ETS:n Alcohol: ocass Sugar beverages:n Sleep:6-7  Drug use: no HH of 4  2 dogs  Work:  [employer]   ROS:  GEN/ HEENT: No fever, significant weight changes sweats headaches vision problems hearing changes, CV/ PULM; No chest pain shortness of breath cough, syncope,edema  change in exercise tolerance. GI /GU: No adominal pain, vomiting, change in bowel habits. No blood in the stool. No significant GU symptoms. SKIN/HEME: ,no acute skin rashes suspicious lesions or bleeding. No lymphadenopathy, nodules, masses.  NEURO/ PSYCH:  No neurologic signs such as weakness numbness. No depression anxiety. IMM/ Allergy: No unusual infections.  Allergy .   REST of 12 system review negative except as per HPI   Past Medical History:  Diagnosis Date  . Abdominal pain, epigastric 01/06/2010  . Chronic headaches   . Esophagitis   . ESOPHAGITIS, HX OF 02/18/2010  . EXTERNAL HEMORRHOIDS 02/18/2010  . FATIGUE 04/13/2007  . Gallstones   . GERD 03/13/2007  . Headache(784.0) 03/13/2007  . HIP PAIN, RIGHT 02/23/2009  . LIVER FUNCTION TESTS, ABNORMAL 01/06/2010  . LOW BACK PAIN, MILD 08/06/2008  .  Ovarian cyst   . Pancreatitis 12/18/09  . PANCREATITIS, ACUTE 02/18/2010   Qualifier: Diagnosis of  By: Nelson-Smith CMA (AAMA), Dottie    . PARESTHESIA 03/13/2007  . Pulmonary nodule    incidental on ct 2007  neg cxray 2011  . Ruptured ovarian cyst   . VARICOSE VEIN 01/06/2010    Past Surgical History:  Procedure Laterality Date  . CHOLECYSTECTOMY    . ENDOVENOUS ABLATION SAPHENOUS VEIN W/ LASER Left 07/02/2020   endovenous laser ablation left greater saphenous vein and stab phlebectomy 10-20 incisions left leg by Gae Gallop MD   . TONSILLECTOMY    . Uterine Polyps      Family History  Problem Relation Age of Onset  . Alcohol abuse Father   . Cardiomyopathy Father   . Anemia Father   . Heart disease Father   . Other Father        Irrg. Heart Beat  . Lung cancer Mother   . Diabetes Mother   . Other Brother        Irr. Heart Beat  . Pancreatitis Maternal Grandfather   . Colon cancer Maternal Grandmother   . Esophageal cancer Neg Hx   . Rectal cancer Neg Hx   . Stomach cancer Neg Hx     Social History   Socioeconomic History  . Marital status: Married    Spouse name: Not on file  . Number of children: 0  . Years  of education: Not on file  . Highest education level: Not on file  Occupational History  . Occupation: PHARMACIST    Employer: [employer]  Tobacco Use  . Smoking status: Never Smoker  . Smokeless tobacco: Never Used  Vaping Use  . Vaping Use: Never used  Substance and Sexual Activity  . Alcohol use: Yes    Comment: rarely  . Drug use: Never  . Sexual activity: Not on file  Other Topics Concern  . Not on file  Social History Narrative   Dogs 2     married is a Software engineer never smoked.  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 DEXILANT 60 MG capsule TAKE 1 CAPSULE(60 MG) BY MOUTH DAILY 30 capsule 0  . ibuprofen (ADVIL,MOTRIN) 200 MG tablet Take 600 mg by mouth every 6 (six) hours as needed for moderate pain.    Marland Kitchen LORazepam (ATIVAN) 1 MG tablet Take 1 tablet 30 minutes prior to leaving house on day of office surgery and bring second tablet with you to the office. (Patient not taking: Reported on 07/09/2020) 2 tablet 0  . SUMAtriptan (IMITREX) 100 MG tablet TAKE 1 TABLET BY MOUTH IMMEDIATELY MAY REPEAT AFTER 2 HOURS--Schedule yearly visit with labs for refills. [phone] 9 tablet 0   Facility-Administered Medications Prior to Visit  Medication Dose Route Frequency Provider Last Rate Last Admin  . 0.9 %  sodium chloride infusion  500 mL Intravenous Once Danis, Kirke Corin, MD         EXAM:  BP 106/76 (BP Location: Left Arm, Patient Position: Sitting, Cuff Size: Large)   Pulse 78   Temp 98.2 F (36.8 C) (Oral)   Ht 5\' 8"  (1.727 m)   Wt 195 lb 9.6 oz (88.7 kg)   LMP 08/13/2020 (Exact Date)   SpO2 97%   BMI 29.74 kg/m   Body mass index is 29.74 kg/m. Wt Readings from Last 3 Encounters:  08/19/20 195 lb 9.6 oz (88.7 kg)  07/02/20 195 lb (88.5 kg)  05/28/20 195 lb (88.5 kg)    Physical Exam: Vital signs reviewed YOM:AYOK is a well-developed well-nourished alert cooperative    who appearsr stated age in no acute distress.  HEENT: normocephalic atraumatic , Eyes: PERRL EOM's full, conjunctiva clear,  Ears: no deformity EAC's clear TMs with normal landmarks. Mouth: clear OP, no lesions, edema.  Moist mucous membranes. Dentition in adequate repair.asked  NECK: supple without masses, thyromegaly or bruits. CHEST/PULM:  Clear to  auscultation and percussion breath sounds equal no wheeze , rales or rhonchi. No chest wall deformities or tenderness. Breast: normal by inspection . No dimpling, discharge, masses, tenderness or discharge . CV: PMI is nondisplaced, S1 S2 no gallops, murmurs, rubs. Peripheral pulses are full without delay.No JVD .  ABDOMEN: Bowel sounds normal nontender  No guard or rebound, no hepato splenomegal no CVA tenderness.  No hernia. Extremtities:  No clubbing cyanosis or edema, no acute joint swelling or redness no focal atrophy  NEURO:  Oriented x3, cranial nerves 3-12 appear to be intact, no obvious focal weakness,gait within normal limits no abnormal reflexes or asymmetrical SKIN: No acute rashes normal turgor, color, no bruising or petechiae. PSYCH: Oriented, good eye contact, no obvious depression anxiety, cognition and judgment appear normal. LN: no cervical axillary inguinal adenopathy  Lab Results  Component Value Date   WBC 6.4 08/19/2020   HGB 13.8 08/19/2020   HCT 40.5 08/19/2020   PLT 284.0 08/19/2020   GLUCOSE 101 (H) 08/19/2020   CHOL 193 08/19/2020   TRIG 56.0 08/19/2020   HDL 55.60 08/19/2020   LDLCALC 126 (H) 08/19/2020   ALT 13 08/19/2020   AST 16 08/19/2020   NA 136 08/19/2020   K 4.7 08/19/2020   CL 103 08/19/2020   CREATININE 0.76 08/19/2020   BUN 14 08/19/2020   CO2 26 08/19/2020   TSH 1.43 08/19/2020   HGBA1C 5.7 08/19/2020    BP Readings from Last 3 Encounters:  08/19/20 106/76  07/09/20 112/71  07/02/20 113/70    Lab plan  reviewed with patient   ASSESSMENT AND PLAN:  Discussed the following assessment and plan:    ICD-10-CM   1. Visit for preventive health examination  E26.83 Basic metabolic panel    CBC with Differential/Platelet    Hepatic function panel    Lipid panel    TSH    T4, free    Hemoglobin A1c    Hemoglobin A1c    T4, free    TSH    Lipid panel    Hepatic function panel    CBC with Differential/Platelet    Basic metabolic  panel  2. Medication management  M19.622 Basic metabolic panel    CBC with Differential/Platelet    Hepatic function panel    Lipid panel    TSH    T4, free    Hemoglobin A1c    Hemoglobin A1c    T4, free    TSH    Lipid panel    Hepatic function panel    CBC with Differential/Platelet    Basic metabolic panel  3. Menstrual migraine without status migrainosus, not intractable  W97.989 Basic metabolic panel    CBC with Differential/Platelet    Hepatic function panel    Lipid panel    TSH    T4, free    Hemoglobin A1c    Hemoglobin A1c    T4, free    TSH    Lipid panel    Hepatic function panel    CBC with Differential/Platelet    Basic metabolic panel  ok to continue  imitrex prn   Stable at this time.  Continue attention  lifestyle intervention healthy eating and exercise .  Return in about 1 year (around 08/19/2021) for preventive /cpx and medications.  Patient Care Team: Burnis Medin, MD as PCP - General Berle Mull, MD (Family Medicine) Maisie Fus, MD (Obstetrics and Gynecology) Barbaraann Cao, OD as Referring Physician [facility]) Patient Instructions   attention lifestyle intervention healthy eating and exercise .  Try adaptive weight watchers  Get  More sleep   Limit  Late eating   portion size .   Td  When  appropriate   Can continue  imitrex   .  Health Maintenance, Female Adopting a healthy lifestyle and getting preventive care are important in promoting health and wellness. Ask your health care provider about:  The right schedule for you to have regular tests and exams.  Things you can do on your own to prevent diseases and keep yourself healthy. What should I know about diet, weight, and exercise? Eat a healthy diet  Eat a diet that includes plenty of vegetables, fruits, low-fat dairy products, and lean protein.  Do not eat a lot of foods that are high in solid fats, added sugars, or sodium.   Maintain a healthy weight Body  mass index (BMI) is used to identify weight problems. It estimates body fat based on height and weight. Your health care provider can help determine your BMI and help you achieve or maintain a healthy weight. Get regular exercise Get regular exercise. This is one of the most important things you can do for your health. Most adults should:  Exercise for at least 150 minutes each week. The exercise should increase your heart rate and make you sweat (moderate-intensity exercise).  Do strengthening exercises at least twice a week. This is in addition to the moderate-intensity exercise.  Spend less time sitting. Even light physical activity can be beneficial. Watch cholesterol and blood lipids Have your blood tested for lipids and cholesterol at 54 years of age, then have this test every 5 years. Have your cholesterol levels checked more often if:  Your lipid or cholesterol levels are high.  You are older than 54 years of age.  You are at high risk for heart disease. What should I know about cancer screening? Depending on your health history and family history, you may need to have cancer screening at various ages. This may include screening for:  Breast cancer.  Cervical cancer.  Colorectal cancer.  Skin cancer.  Lung cancer. What should I know about heart disease, diabetes, and high blood pressure? Blood pressure and heart disease  High blood pressure causes heart disease and increases the risk of stroke. This is more likely to develop in people who have high blood pressure readings, are of African descent, or are overweight.  Have your blood pressure checked: ? Every 3-5 years if you are 66-110 years of age. ? Every year if you are 35 years old or older. Diabetes Have regular diabetes screenings. This checks your fasting blood sugar level. Have the screening done:  Once every three years after age 57 if you are at a normal weight and have a low risk for diabetes.  More often  and at a younger age if you are overweight or have a high risk for diabetes. What should I know about preventing infection? Hepatitis B If you have a higher risk for hepatitis B, you should be screened for this virus. Talk with your health care provider to find out if you are at risk for hepatitis B infection. Hepatitis C Testing is recommended for:  Everyone born from [birth year] through 1964.  Anyone with known risk factors for hepatitis C. Sexually transmitted infections (STIs)  Get screened for STIs, including gonorrhea and chlamydia, if: ? You are sexually active and are younger than 54 years of age. ? You are older than 54 years of age and your health care provider tells you that you are at risk for this type of infection. ? Your sexual activity has changed since you were last screened, and you are at increased risk for chlamydia or gonorrhea. Ask your health care  provider if you are at risk.  Ask your health care provider about whether you are at high risk for HIV. Your health care provider may recommend a prescription medicine to help prevent HIV infection. If you choose to take medicine to prevent HIV, you should first get tested for HIV. You should then be tested every 3 months for as long as you are taking the medicine. Pregnancy  If you are about to stop having your period (premenopausal) and you may become pregnant, seek counseling before you get pregnant.  Take 400 to 800 micrograms (mcg) of folic acid every day if you become pregnant.  Ask for birth control (contraception) if you want to prevent pregnancy. Osteoporosis and menopause Osteoporosis is a disease in which the bones lose minerals and strength with aging. This can result in bone fractures. If you are 54 years old or older, or if you are at risk for osteoporosis and fractures, ask your health care provider if you should:  Be screened for bone loss.  Take a calcium or vitamin D supplement to lower your risk of  fractures.  Be given hormone replacement therapy (HRT) to treat symptoms of menopause. Follow these instructions at home: Lifestyle  Do not use any products that contain nicotine or tobacco, such as cigarettes, e-cigarettes, and chewing tobacco. If you need help quitting, ask your health care provider.  Do not use street drugs.  Do not share needles.  Ask your health care provider for help if you need support or information about quitting drugs. Alcohol use  Do not drink alcohol if: ? Your health care provider tells you not to drink. ? You are pregnant, may be pregnant, or are planning to become pregnant.  If you drink alcohol: ? Limit how much you use to 0-1 drink a day. ? Limit intake if you are breastfeeding.  Be aware of how much alcohol is in your drink. In the [place] one drink equals one 12 oz bottle of beer (355 mL), one 5 oz glass of wine (148 mL), or one 1 oz glass of hard liquor (44 mL). General instructions  Schedule regular health, dental, and eye exams.  Stay current with your vaccines.  Tell your health care provider if: ? You often feel depressed. ? You have ever been abused or do not feel safe at home. Summary  Adopting a healthy lifestyle and getting preventive care are important in promoting health and wellness.  Follow your health care provider's instructions about healthy diet, exercising, and getting tested or screened for diseases.  Follow your health care provider's instructions on monitoring your cholesterol and blood pressure. This information is not intended to replace advice given to you by your health care provider. Make sure you discuss any questions you have with your health care provider. Document Revised: 05/02/2018 Document Reviewed: 05/02/2018 Elsevier Patient Education  2021 Durand K. Esco Joslyn M.D.

## 2020-08-19 ENCOUNTER — Ambulatory Visit (INDEPENDENT_AMBULATORY_CARE_PROVIDER_SITE_OTHER): Payer: BC Managed Care – PPO | Admitting: Internal Medicine

## 2020-08-19 ENCOUNTER — Encounter: Payer: Self-pay | Admitting: Internal Medicine

## 2020-08-19 ENCOUNTER — Other Ambulatory Visit: Payer: Self-pay

## 2020-08-19 VITALS — BP 106/76 | HR 78 | Temp 98.2°F | Ht 68.0 in | Wt 195.6 lb

## 2020-08-19 DIAGNOSIS — Z0000 Encounter for general adult medical examination without abnormal findings: Secondary | ICD-10-CM

## 2020-08-19 DIAGNOSIS — Z79899 Other long term (current) drug therapy: Secondary | ICD-10-CM

## 2020-08-19 DIAGNOSIS — G43829 Menstrual migraine, not intractable, without status migrainosus: Secondary | ICD-10-CM

## 2020-08-19 LAB — CBC WITH DIFFERENTIAL/PLATELET
Basophils Absolute: 0.1 10*3/uL (ref 0.0–0.1)
Basophils Relative: 1.2 % (ref 0.0–3.0)
Eosinophils Absolute: 0.2 10*3/uL (ref 0.0–0.7)
Eosinophils Relative: 3.4 % (ref 0.0–5.0)
HCT: 40.5 % (ref 36.0–46.0)
Hemoglobin: 13.8 g/dL (ref 12.0–15.0)
Lymphocytes Relative: 25.3 % (ref 12.0–46.0)
Lymphs Abs: 1.6 10*3/uL (ref 0.7–4.0)
MCHC: 34.1 g/dL (ref 30.0–36.0)
MCV: 92.8 fl (ref 78.0–100.0)
Monocytes Absolute: 0.4 10*3/uL (ref 0.1–1.0)
Monocytes Relative: 6.1 % (ref 3.0–12.0)
Neutro Abs: 4.1 10*3/uL (ref 1.4–7.7)
Neutrophils Relative %: 64 % (ref 43.0–77.0)
Platelets: 284 10*3/uL (ref 150.0–400.0)
RBC: 4.37 Mil/uL (ref 3.87–5.11)
RDW: 13.3 % (ref 11.5–15.5)
WBC: 6.4 10*3/uL (ref 4.0–10.5)

## 2020-08-19 LAB — BASIC METABOLIC PANEL
BUN: 14 mg/dL (ref 6–23)
CO2: 26 mEq/L (ref 19–32)
Calcium: 8.8 mg/dL (ref 8.4–10.5)
Chloride: 103 mEq/L (ref 96–112)
Creatinine, Ser: 0.76 mg/dL (ref 0.40–1.20)
GFR: 89.25 mL/min (ref >60.00)
Glucose, Bld: 101 mg/dL — ABNORMAL HIGH (ref 70–99)
Potassium: 4.7 mEq/L (ref 3.5–5.1)
Sodium: 136 mEq/L (ref 135–145)

## 2020-08-19 LAB — HEPATIC FUNCTION PANEL
ALT: 13 U/L (ref 0–35)
AST: 16 U/L (ref 0–37)
Albumin: 3.9 g/dL (ref 3.5–5.2)
Alkaline Phosphatase: 50 U/L (ref 39–117)
Bilirubin, Direct: 0.1 mg/dL (ref 0.0–0.3)
Total Bilirubin: 0.4 mg/dL (ref 0.2–1.2)
Total Protein: 6.8 g/dL (ref 6.0–8.3)

## 2020-08-19 LAB — LIPID PANEL
Cholesterol: 193 mg/dL (ref 0–200)
HDL: 55.6 mg/dL (ref >39.00)
LDL Cholesterol: 126 mg/dL — ABNORMAL HIGH (ref 0–99)
NonHDL: 137.19
Total CHOL/HDL Ratio: 3
Triglycerides: 56 mg/dL (ref 0.0–149.0)
VLDL: 11.2 mg/dL (ref 0.0–40.0)

## 2020-08-19 LAB — T4, FREE: Free T4: 0.9 ng/dL (ref 0.60–1.60)

## 2020-08-19 LAB — TSH: TSH: 1.43 u[IU]/mL (ref 0.35–4.50)

## 2020-08-19 LAB — HEMOGLOBIN A1C: Hgb A1c MFr Bld: 5.7 % (ref 4.6–6.5)

## 2020-08-19 NOTE — Patient Instructions (Signed)
attention lifestyle intervention healthy eating and exercise .  Try adaptive weight watchers  Get  More sleep   Limit  Late eating   portion size .   Td  When  appropriate   Can continue  imitrex   .     Health Maintenance, Female Adopting a healthy lifestyle and getting preventive care are important in promoting health and wellness. Ask your health care provider about:  The right schedule for you to have regular tests and exams.  Things you can do on your own to prevent diseases and keep yourself healthy. What should I know about diet, weight, and exercise? Eat a healthy diet  Eat a diet that includes plenty of vegetables, fruits, low-fat dairy products, and lean protein.  Do not eat a lot of foods that are high in solid fats, added sugars, or sodium.   Maintain a healthy weight Body mass index (BMI) is used to identify weight problems. It estimates body fat based on height and weight. Your health care provider can help determine your BMI and help you achieve or maintain a healthy weight. Get regular exercise Get regular exercise. This is one of the most important things you can do for your health. Most adults should:  Exercise for at least 150 minutes each week. The exercise should increase your heart rate and make you sweat (moderate-intensity exercise).  Do strengthening exercises at least twice a week. This is in addition to the moderate-intensity exercise.  Spend less time sitting. Even light physical activity can be beneficial. Watch cholesterol and blood lipids Have your blood tested for lipids and cholesterol at 54 years of age, then have this test every 5 years. Have your cholesterol levels checked more often if:  Your lipid or cholesterol levels are high.  You are older than 54 years of age.  You are at high risk for heart disease. What should I know about cancer screening? Depending on your health history and family history, you may need to have cancer screening  at various ages. This may include screening for:  Breast cancer.  Cervical cancer.  Colorectal cancer.  Skin cancer.  Lung cancer. What should I know about heart disease, diabetes, and high blood pressure? Blood pressure and heart disease  High blood pressure causes heart disease and increases the risk of stroke. This is more likely to develop in people who have high blood pressure readings, are of African descent, or are overweight.  Have your blood pressure checked: ? Every 3-5 years if you are 70-43 years of age. ? Every year if you are 54 years old or older. Diabetes Have regular diabetes screenings. This checks your fasting blood sugar level. Have the screening done:  Once every three years after age 72 if you are at a normal weight and have a low risk for diabetes.  More often and at a younger age if you are overweight or have a high risk for diabetes. What should I know about preventing infection? Hepatitis B If you have a higher risk for hepatitis B, you should be screened for this virus. Talk with your health care provider to find out if you are at risk for hepatitis B infection. Hepatitis C Testing is recommended for:  Everyone born from [birth year] through 1964.  Anyone with known risk factors for hepatitis C. Sexually transmitted infections (STIs)  Get screened for STIs, including gonorrhea and chlamydia, if: ? You are sexually active and are younger than 54 years of age. ? You are  older than 54 years of age and your health care provider tells you that you are at risk for this type of infection. ? Your sexual activity has changed since you were last screened, and you are at increased risk for chlamydia or gonorrhea. Ask your health care provider if you are at risk.  Ask your health care provider about whether you are at high risk for HIV. Your health care provider may recommend a prescription medicine to help prevent HIV infection. If you choose to take medicine to  prevent HIV, you should first get tested for HIV. You should then be tested every 3 months for as long as you are taking the medicine. Pregnancy  If you are about to stop having your period (premenopausal) and you may become pregnant, seek counseling before you get pregnant.  Take 400 to 800 micrograms (mcg) of folic acid every day if you become pregnant.  Ask for birth control (contraception) if you want to prevent pregnancy. Osteoporosis and menopause Osteoporosis is a disease in which the bones lose minerals and strength with aging. This can result in bone fractures. If you are 6 years old or older, or if you are at risk for osteoporosis and fractures, ask your health care provider if you should:  Be screened for bone loss.  Take a calcium or vitamin D supplement to lower your risk of fractures.  Be given hormone replacement therapy (HRT) to treat symptoms of menopause. Follow these instructions at home: Lifestyle  Do not use any products that contain nicotine or tobacco, such as cigarettes, e-cigarettes, and chewing tobacco. If you need help quitting, ask your health care provider.  Do not use street drugs.  Do not share needles.  Ask your health care provider for help if you need support or information about quitting drugs. Alcohol use  Do not drink alcohol if: ? Your health care provider tells you not to drink. ? You are pregnant, may be pregnant, or are planning to become pregnant.  If you drink alcohol: ? Limit how much you use to 0-1 drink a day. ? Limit intake if you are breastfeeding.  Be aware of how much alcohol is in your drink. In the [place] one drink equals one 12 oz bottle of beer (355 mL), one 5 oz glass of wine (148 mL), or one 1 oz glass of hard liquor (44 mL). General instructions  Schedule regular health, dental, and eye exams.  Stay current with your vaccines.  Tell your health care provider if: ? You often feel depressed. ? You have ever been  abused or do not feel safe at home. Summary  Adopting a healthy lifestyle and getting preventive care are important in promoting health and wellness.  Follow your health care provider's instructions about healthy diet, exercising, and getting tested or screened for diseases.  Follow your health care provider's instructions on monitoring your cholesterol and blood pressure. This information is not intended to replace advice given to you by your health care provider. Make sure you discuss any questions you have with your health care provider. Document Revised: 05/02/2018 Document Reviewed: 05/02/2018 Elsevier Patient Education  2021 [employer].

## 2020-08-21 ENCOUNTER — Encounter: Payer: Self-pay | Admitting: Internal Medicine

## 2020-08-21 DIAGNOSIS — F419 Anxiety disorder, unspecified: Secondary | ICD-10-CM | POA: Diagnosis not present

## 2020-08-21 DIAGNOSIS — G43829 Menstrual migraine, not intractable, without status migrainosus: Secondary | ICD-10-CM | POA: Insufficient documentation

## 2020-08-21 MED ORDER — SUMATRIPTAN SUCCINATE 100 MG PO TABS: ORAL_TABLET | ORAL | 3 refills | Status: DC

## 2020-08-21 NOTE — Progress Notes (Signed)
Thryoid nl no diabetes  but borderline fasting blood glucose, cholesterol acceptable could be slightly better with lifestyle attention.  Repeat monitoring blood work in a year or as needed.

## 2020-09-18 DIAGNOSIS — F419 Anxiety disorder, unspecified: Secondary | ICD-10-CM | POA: Diagnosis not present

## 2020-09-25 DIAGNOSIS — F419 Anxiety disorder, unspecified: Secondary | ICD-10-CM | POA: Diagnosis not present

## 2020-10-07 NOTE — Telephone Encounter (Signed)
Set up a virtual OV for this

## 2020-10-08 ENCOUNTER — Telehealth: Payer: Self-pay | Admitting: Internal Medicine

## 2020-10-08 NOTE — Telephone Encounter (Signed)
Per patient was told to call in and get an appointment with Dr fry( according  To her message on mychart ) because Dr. Regis Bill is unavailable. There are no available appointments for tomorrow or today. the patient wants to know if she can be fit in for an appointment please advise

## 2020-10-08 NOTE — Telephone Encounter (Signed)
I spoke with the patient and a video visit was scheduled for 05/20 with Dr. Quay Burow at [facility] due to having no providers with availability at [facility].

## 2020-10-08 NOTE — Telephone Encounter (Signed)
Pt is calling back in to let us know that she was tested at [place] and her results was that she is positive for type A-flu.  Pt is aware that we do not have anything today 10/08/2020 or tomorrow 10/09/2020 pt was offered another facility and to go to [facility] or ER pt declined and stated that she will wait it out to see what develops.

## 2020-10-08 NOTE — Progress Notes (Signed)
Virtual Visit via Video Note  I connected with Debra [name] on 10/09/20 at  2:20 PM EDT by a video enabled telemedicine application and verified that I am speaking with the correct person using two identifiers.   I discussed the limitations of evaluation and management by telemedicine and the availability of in person appointments. The patient expressed understanding and agreed to proceed.  Present for the visit:  Myself, Dr Billey Gosling, Reesa Chew.  The patient is currently at home and I am in the office.    No referring provider.    History of Present Illness: This is an acute visit for flu.  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  She tested +5/18 for influenza A.  Strep test at that time was negative.  Symptoms started 5/9 with a sore throat and did not feel good.  She had a low grade fever. She was coughing.  She was negative for covid.  Fever tmax 101.6.    Fever keeps spiking and then comes down.  She is taking advil.  She does have fatigue, nasal congestion with clear mucus, some ear pain yesterday.  Her sore throat has resolved.  She has a positive dry cough, some body aches and neck pain and headaches.  Her brother and her daughter both had the flu as well.  She is eating and drinking well.    Review of Systems  Constitutional: Positive for chills, fever and malaise/fatigue.  HENT: Positive for congestion (clear), ear pain (yesterday) and sore throat (resolved). Negative for sinus pain.   Respiratory: Positive for cough. Negative for shortness of breath and wheezing.   Musculoskeletal: Positive for myalgias and neck pain. Negative for joint pain.  Neurological: Positive for headaches.      Social History   Socioeconomic History  . Marital status: Married    Spouse name: Not on file  . Number of children: 0  . Years of education: Not on file  . Highest education level: Not on file  Occupational History  . Occupation:  PHARMACIST    Employer: [employer]  Tobacco Use  . Smoking status: Never Smoker  . Smokeless tobacco: Never Used  Vaping Use  . Vaping Use: Never used  Substance and Sexual Activity  . Alcohol use: Yes    Comment: rarely  . Drug use: Never  . Sexual activity: Not on file  Other Topics Concern  . Not on file  Social History Narrative   Dogs 2     married is a Software engineer never smoked.  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 [employer]    hx fertility treatment    hh of 3   6- 8 hours  Sleep    3 diet coke per day   2 etoh per month    4 yoyear okd  adopted at 7 weeks  Debra [name]    Building house                   Social Determinants of Health   Financial Resource Strain: Not on file  Food Insecurity: Not on file  Transportation Needs: Not on file  Physical Activity: Not on file  Stress: Not on file  Social Connections: Not on file     Observations/Objective: Appears well in NAD Breathing normally  Assessment and Plan:  Influenza A: Tested +5/18 at work Symptoms actually started 5/9 Her brother and her daughter both tested positive for flu-her brother tested  2 weeks ago COVID test was negative, strep test was negative Still having spikes of fever and having symptoms She is out of the window for Tamiflu Discussed symptomatic treatment-continue Advil as needed, over-the-counter cold medications, increase fluids Will prescribe Tussionex cough syrup 5 mL every 12 hours as needed We discussed that her symptoms are more prolonged than what I would expect for the flu-?  Whether she had 2 different illnesses still having fevers at this time, but her symptoms are consistent with a viral infection If they do not improve next week advised her to touch base with Debra [name] and let Debra [name] know    Follow Up Instructions:    I discussed the assessment and treatment plan with the patient. The patient was provided an opportunity to ask questions and all were answered. The patient agreed  with the plan and demonstrated an understanding of the instructions.   The patient was advised to call back or seek an in-person evaluation if the symptoms worsen or if the condition fails to improve as anticipated.    Binnie Rail, MD

## 2020-10-08 NOTE — Telephone Encounter (Signed)
I spoke with the patient and a virtual visit with Dr. Quay Burow with [facility] was scheduled for 05/20 due to provider limited availability at [facility] location.

## 2020-10-09 ENCOUNTER — Telehealth (INDEPENDENT_AMBULATORY_CARE_PROVIDER_SITE_OTHER): Payer: BC Managed Care – PPO | Admitting: Internal Medicine

## 2020-10-09 ENCOUNTER — Other Ambulatory Visit: Payer: Self-pay

## 2020-10-09 ENCOUNTER — Encounter: Payer: Self-pay | Admitting: Internal Medicine

## 2020-10-09 DIAGNOSIS — J101 Influenza due to other identified influenza virus with other respiratory manifestations: Secondary | ICD-10-CM

## 2020-10-09 MED ORDER — HYDROCOD POLST-CPM POLST ER 10-8 MG/5ML PO SUER: 5.0000 mL | Freq: Two times a day (BID) | ORAL | 0 refills | Status: DC | PRN

## 2020-10-09 NOTE — Telephone Encounter (Signed)
noted 

## 2020-10-23 DIAGNOSIS — F419 Anxiety disorder, unspecified: Secondary | ICD-10-CM | POA: Diagnosis not present

## 2020-10-30 DIAGNOSIS — F419 Anxiety disorder, unspecified: Secondary | ICD-10-CM | POA: Diagnosis not present

## 2020-11-13 DIAGNOSIS — F419 Anxiety disorder, unspecified: Secondary | ICD-10-CM | POA: Diagnosis not present

## 2020-11-20 DIAGNOSIS — F419 Anxiety disorder, unspecified: Secondary | ICD-10-CM | POA: Diagnosis not present

## 2020-11-27 DIAGNOSIS — F419 Anxiety disorder, unspecified: Secondary | ICD-10-CM | POA: Diagnosis not present

## 2020-12-04 DIAGNOSIS — F419 Anxiety disorder, unspecified: Secondary | ICD-10-CM | POA: Diagnosis not present

## 2020-12-11 DIAGNOSIS — F419 Anxiety disorder, unspecified: Secondary | ICD-10-CM | POA: Diagnosis not present

## 2020-12-18 DIAGNOSIS — F419 Anxiety disorder, unspecified: Secondary | ICD-10-CM | POA: Diagnosis not present

## 2020-12-25 DIAGNOSIS — N951 Menopausal and female climacteric states: Secondary | ICD-10-CM | POA: Diagnosis not present

## 2020-12-25 DIAGNOSIS — F419 Anxiety disorder, unspecified: Secondary | ICD-10-CM | POA: Diagnosis not present

## 2020-12-25 DIAGNOSIS — E7800 Pure hypercholesterolemia, unspecified: Secondary | ICD-10-CM | POA: Diagnosis not present

## 2020-12-25 DIAGNOSIS — R635 Abnormal weight gain: Secondary | ICD-10-CM | POA: Diagnosis not present

## 2021-01-01 DIAGNOSIS — Z6830 Body mass index (BMI) 30.0-30.9, adult: Secondary | ICD-10-CM | POA: Diagnosis not present

## 2021-01-01 DIAGNOSIS — Z1331 Encounter for screening for depression: Secondary | ICD-10-CM | POA: Diagnosis not present

## 2021-01-01 DIAGNOSIS — R635 Abnormal weight gain: Secondary | ICD-10-CM | POA: Diagnosis not present

## 2021-01-01 DIAGNOSIS — E7800 Pure hypercholesterolemia, unspecified: Secondary | ICD-10-CM | POA: Diagnosis not present

## 2021-01-01 DIAGNOSIS — Z1339 Encounter for screening examination for other mental health and behavioral disorders: Secondary | ICD-10-CM | POA: Diagnosis not present

## 2021-01-08 DIAGNOSIS — Z6830 Body mass index (BMI) 30.0-30.9, adult: Secondary | ICD-10-CM | POA: Diagnosis not present

## 2021-01-08 DIAGNOSIS — E7800 Pure hypercholesterolemia, unspecified: Secondary | ICD-10-CM | POA: Diagnosis not present

## 2021-01-15 DIAGNOSIS — Z6830 Body mass index (BMI) 30.0-30.9, adult: Secondary | ICD-10-CM | POA: Diagnosis not present

## 2021-01-15 DIAGNOSIS — E7800 Pure hypercholesterolemia, unspecified: Secondary | ICD-10-CM | POA: Diagnosis not present

## 2021-01-15 DIAGNOSIS — F419 Anxiety disorder, unspecified: Secondary | ICD-10-CM | POA: Diagnosis not present

## 2021-01-20 DIAGNOSIS — Z1231 Encounter for screening mammogram for malignant neoplasm of breast: Secondary | ICD-10-CM | POA: Diagnosis not present

## 2021-01-20 DIAGNOSIS — Z01419 Encounter for gynecological examination (general) (routine) without abnormal findings: Secondary | ICD-10-CM | POA: Diagnosis not present

## 2021-01-20 DIAGNOSIS — Z6830 Body mass index (BMI) 30.0-30.9, adult: Secondary | ICD-10-CM | POA: Diagnosis not present

## 2021-01-22 DIAGNOSIS — F419 Anxiety disorder, unspecified: Secondary | ICD-10-CM | POA: Diagnosis not present

## 2021-01-29 DIAGNOSIS — F419 Anxiety disorder, unspecified: Secondary | ICD-10-CM | POA: Diagnosis not present

## 2021-01-29 DIAGNOSIS — E7800 Pure hypercholesterolemia, unspecified: Secondary | ICD-10-CM | POA: Diagnosis not present

## 2021-01-29 DIAGNOSIS — Z6829 Body mass index (BMI) 29.0-29.9, adult: Secondary | ICD-10-CM | POA: Diagnosis not present

## 2021-01-30 ENCOUNTER — Encounter (HOSPITAL_COMMUNITY): Payer: Self-pay

## 2021-01-30 ENCOUNTER — Other Ambulatory Visit: Payer: Self-pay

## 2021-01-30 ENCOUNTER — Ambulatory Visit (HOSPITAL_COMMUNITY)
Admission: EM | Admit: 2021-01-30 | Discharge: 2021-01-30 | Disposition: A | Discharge status: Home / Self Care | Payer: BC Managed Care – PPO | Attending: Family Medicine | Admitting: Family Medicine

## 2021-01-30 DIAGNOSIS — J029 Acute pharyngitis, unspecified: Secondary | ICD-10-CM

## 2021-01-30 LAB — POCT RAPID STREP A, ED / UC: Streptococcus, Group A Screen (Direct): NEGATIVE

## 2021-01-30 LAB — POCT INFECTIOUS MONO SCREEN, ED / UC: Mono Screen: NEGATIVE

## 2021-01-30 MED ORDER — PREDNISONE 20 MG PO TABS: 40.0000 mg | ORAL_TABLET | Freq: Every day | ORAL | 0 refills | Status: DC

## 2021-01-30 MED ORDER — LIDOCAINE VISCOUS HCL 2 % MT SOLN: 10.0000 mL | OROMUCOSAL | 0 refills | Status: DC | PRN

## 2021-01-30 NOTE — ED Triage Notes (Signed)
Pt presents with a  sore throat x 10 days. States she has left ear pain x 2 days.   Pt states she has taken NyQuil at night and Ibuprofen; states it has not given her relief.

## 2021-01-30 NOTE — ED Provider Notes (Signed)
[place]    CSN: [id number] Arrival date & time: 01/30/21  1131      History   Chief Complaint Chief Complaint  Patient presents with   Sore Throat    HPI Debra [name] is a 55 y.o. female.   Patient presenting today with 10-day history of sore, swollen throat and now 2-day history of left ear pain additionally.  She denies fever, chills, cough, congestion, dizziness, chest pain, shortness of breath, abdominal pain, nausea vomiting or diarrhea.  She has tried NyQuil, ibuprofen with no relief.  States that multiple family members have been sick with multiple issues recently.  COVID test negative for family members.  No pertinent chronic medical problems.   Past Medical History:  Diagnosis Date   Abdominal pain, epigastric 01/06/2010   Chronic headaches    Esophagitis    ESOPHAGITIS, HX OF 02/18/2010   EXTERNAL HEMORRHOIDS 02/18/2010   FATIGUE 04/13/2007   Gallstones    GERD 03/13/2007   Headache(784.0) 03/13/2007   HIP PAIN, RIGHT 02/23/2009   LIVER FUNCTION TESTS, ABNORMAL 01/06/2010   LOW BACK PAIN, MILD 08/06/2008   Ovarian cyst    Pancreatitis 12/18/09   PANCREATITIS, ACUTE 02/18/2010   Qualifier: Diagnosis of  By: Nelson-Smith CMA (AAMA), Dottie     PARESTHESIA 03/13/2007   Pulmonary nodule    incidental on ct 2007  neg cxray 2011   Ruptured ovarian cyst    VARICOSE VEIN 01/06/2010    Patient Active Problem List   Diagnosis Date Noted   Menstrual migraine without status migrainosus, not intractable 08/21/2020   Bloating 06/12/2019   Fecal smearing 06/12/2019   Dyspnea on exertion 01/11/2019   Primary osteoarthritis of left knee 08/01/2017   Chronic pain of left knee 05/29/2017   Dyslipidemia 01/24/2017   Upper airway cough syndrome 03/27/2014   Migraine with aura 01/10/2012   Chronic sore throat 09/20/2011   Neck pain, acute 03/25/2011   Atypical chest pain 07/20/2010   Chest pain, central 07/02/2010   Pulmonary nodule    EXTERNAL  HEMORRHOIDS 02/18/2010   ESOPHAGITIS, HX OF 02/18/2010   VARICOSE VEIN 01/06/2010   ABDOMINAL PAIN, EPIGASTRIC 01/06/2010   LIVER FUNCTION TESTS, ABNORMAL 01/06/2010   HIP PAIN, RIGHT 02/23/2009   BACK PAIN 02/23/2009   LOW BACK PAIN, MILD 08/06/2008   ABDOMINAL BLOATING 08/06/2008   OTHER SYMPTOMS INVOLVING DIGESTIVE SYSTEM OTHER 08/06/2008   Chronic cough 06/03/2008   URINARY FREQUENCY 04/13/2007   GERD 03/13/2007   PARESTHESIA 03/13/2007   HEADACHE 03/13/2007    Past Surgical History:  Procedure Laterality Date   CHOLECYSTECTOMY     ENDOVENOUS ABLATION SAPHENOUS VEIN W/ LASER Left 07/02/2020   endovenous laser ablation left greater saphenous vein and stab phlebectomy 10-20 incisions left leg by Gae Gallop MD    TONSILLECTOMY     Uterine Polyps      OB History   No obstetric history on file.      Home Medications    Prior to Admission medications   Medication Sig Start Date End Date Taking? Authorizing Provider  lidocaine (XYLOCAINE) 2 % solution Use as directed 10 mLs in the mouth or throat as needed for mouth pain. 01/30/21  Yes Volney American, PA-C  predniSONE (DELTASONE) 20 MG tablet Take 2 tablets (40 mg total) by mouth daily with breakfast. 01/30/21  Yes Volney American, PA-C  chlorpheniramine-HYDROcodone [facility]) 10-8 MG/5ML SUER Take 5 mLs by mouth every 12 (twelve) hours as needed for cough. 10/09/20  Binnie Rail, MD  DEXILANT 60 MG capsule TAKE 1 CAPSULE(60 MG) BY MOUTH DAILY 05/19/19   Panosh, Standley Brooking, MD  ibuprofen (ADVIL,MOTRIN) 200 MG tablet Take 600 mg by mouth every 6 (six) hours as needed for moderate pain.    [provider]  SUMAtriptan (IMITREX) 100 MG tablet TAKE 1 TABLET BY MOUTH IMMEDIATELY MAY REPEAT AFTER 2 HOURS--Schedule yearly visit with labs for refills. [phone] 08/21/20   Panosh, Standley Brooking, MD    Family History Family History  Problem Relation Age of Onset   Alcohol abuse Father     Cardiomyopathy Father    Anemia Father    Heart disease Father    Other Father        Irrg. Heart Beat   Lung cancer Mother    Diabetes Mother    Other Brother        Irr. Heart Beat   Pancreatitis Maternal Grandfather    Colon cancer Maternal Grandmother    Esophageal cancer Neg Hx    Rectal cancer Neg Hx    Stomach cancer Neg Hx     Social History Social History   Tobacco Use   Smoking status: Never   Smokeless tobacco: Never  Vaping Use   Vaping Use: Never used  Substance Use Topics   Alcohol use: Yes    Comment: rarely   Drug use: Never     Allergies   Gadolinium, Penicillins, and Sulfonamide derivatives   Review of Systems Review of Systems Per HPI  Physical Exam Triage Vital Signs ED Triage Vitals  Enc Vitals Group     BP 01/30/21 1320 111/73     Pulse Rate 01/30/21 1320 62     Resp 01/30/21 1320 19     Temp 01/30/21 1320 98 F (36.7 C)     Temp Source 01/30/21 1320 Oral     SpO2 01/30/21 1320 99 %     Weight --      Height --      Head Circumference --      Peak Flow --      Pain Score 01/30/21 1319 3     Pain Loc --      Pain Edu? --      Excl. in [place]? --    No data found.  Updated Vital Signs BP 111/73 (BP Location: Right Arm)   Pulse 62   Temp 98 F (36.7 C) (Oral)   Resp 19   LMP 01/18/2021 (Approximate)   SpO2 99%   Visual Acuity Right Eye Distance:   Left Eye Distance:   Bilateral Distance:    Right Eye Near:   Left Eye Near:    Bilateral Near:     Physical Exam Vitals and nursing note reviewed.  Constitutional:      Appearance: Normal appearance. She is not ill-appearing.  HENT:     Head: Atraumatic.     Right Ear: Tympanic membrane normal.     Left Ear: Tympanic membrane normal.     Nose: Nose normal.     Mouth/Throat:     Mouth: Mucous membranes are moist.     Pharynx: Posterior oropharyngeal erythema present. No oropharyngeal exudate.     Comments: Status post tonsillectomy bilaterally.  Posterior oropharyngeal  erythema, no exudates.  Uvula midline, oral airway patent Eyes:     Extraocular Movements: Extraocular movements intact.     Conjunctiva/sclera: Conjunctivae normal.  Cardiovascular:     Rate and Rhythm: Normal rate and regular rhythm.  Heart sounds: Normal heart sounds.  Pulmonary:     Effort: Pulmonary effort is normal.     Breath sounds: Normal breath sounds. No wheezing or rales.  Musculoskeletal:        General: Normal range of motion.     Cervical back: Normal range of motion and neck supple.  Skin:    General: Skin is warm and dry.  Neurological:     Mental Status: She is alert and oriented to person, place, and time.  Psychiatric:        Mood and Affect: Mood normal.        Thought Content: Thought content normal.        Judgment: Judgment normal.     UC Treatments / Results  Labs (all labs ordered are listed, but only abnormal results are displayed) Labs Reviewed  CULTURE, GROUP A STREP [facility])  POCT INFECTIOUS MONO SCREEN, ED / UC  POCT RAPID STREP A, ED / UC    EKG   Radiology No results found.  Procedures Procedures (including critical care time)  Medications Ordered in UC Medications - No data to display  Initial Impression / Assessment and Plan / UC Course  I have reviewed the triage vital signs and the nursing notes.  Pertinent labs & imaging results that were available during my care of the patient were reviewed by me and considered in my medical decision making (see chart for details).     Vital signs and exam reassuring, rapid strep and point-of-care mono testing both negative.  Throat culture pending.  Suspect inflammatory cause given persistence without bacterial evidence.  We will treat with prednisone, viscous lidocaine, trial allergy medication in case allergy related.  Follow-up if worsening symptoms.  Final Clinical Impressions(s) / UC Diagnoses   Final diagnoses:  Pharyngitis, unspecified etiology   Discharge Instructions    None    ED Prescriptions     Medication Sig Dispense Auth. Provider   predniSONE (DELTASONE) 20 MG tablet Take 2 tablets (40 mg total) by mouth daily with breakfast. 10 tablet Volney American, PA-C   lidocaine (XYLOCAINE) 2 % solution Use as directed 10 mLs in the mouth or throat as needed for mouth pain. 100 mL Volney American, [place]      PDMP not reviewed this encounter.   Volney American, [place] 01/30/21 1556

## 2021-02-02 LAB — CULTURE, GROUP A STREP (THRC)

## 2021-02-05 DIAGNOSIS — E7800 Pure hypercholesterolemia, unspecified: Secondary | ICD-10-CM | POA: Diagnosis not present

## 2021-02-05 DIAGNOSIS — Z6830 Body mass index (BMI) 30.0-30.9, adult: Secondary | ICD-10-CM | POA: Diagnosis not present

## 2021-02-05 NOTE — Telephone Encounter (Signed)
Make sure you had a covid test     omicron can cause a severe sore throat in many people. ( Would do home test and if negative  a pcr test.)  if negative and continuing can   make a  fu visit

## 2021-02-08 NOTE — Telephone Encounter (Signed)
Suggest make an office visit with a provider if not improving.

## 2021-02-12 DIAGNOSIS — E7800 Pure hypercholesterolemia, unspecified: Secondary | ICD-10-CM | POA: Diagnosis not present

## 2021-02-12 DIAGNOSIS — Z6829 Body mass index (BMI) 29.0-29.9, adult: Secondary | ICD-10-CM | POA: Diagnosis not present

## 2021-02-12 DIAGNOSIS — F419 Anxiety disorder, unspecified: Secondary | ICD-10-CM | POA: Diagnosis not present

## 2021-02-19 DIAGNOSIS — E7800 Pure hypercholesterolemia, unspecified: Secondary | ICD-10-CM | POA: Diagnosis not present

## 2021-02-19 DIAGNOSIS — Z6829 Body mass index (BMI) 29.0-29.9, adult: Secondary | ICD-10-CM | POA: Diagnosis not present

## 2021-02-19 DIAGNOSIS — F419 Anxiety disorder, unspecified: Secondary | ICD-10-CM | POA: Diagnosis not present

## 2021-02-26 DIAGNOSIS — E7800 Pure hypercholesterolemia, unspecified: Secondary | ICD-10-CM | POA: Diagnosis not present

## 2021-02-26 DIAGNOSIS — E559 Vitamin D deficiency, unspecified: Secondary | ICD-10-CM | POA: Diagnosis not present

## 2021-02-26 DIAGNOSIS — Z6829 Body mass index (BMI) 29.0-29.9, adult: Secondary | ICD-10-CM | POA: Diagnosis not present

## 2021-03-12 DIAGNOSIS — F419 Anxiety disorder, unspecified: Secondary | ICD-10-CM | POA: Diagnosis not present

## 2021-03-12 DIAGNOSIS — Z6829 Body mass index (BMI) 29.0-29.9, adult: Secondary | ICD-10-CM | POA: Diagnosis not present

## 2021-03-12 DIAGNOSIS — E7800 Pure hypercholesterolemia, unspecified: Secondary | ICD-10-CM | POA: Diagnosis not present

## 2021-03-16 DIAGNOSIS — Z23 Encounter for immunization: Secondary | ICD-10-CM | POA: Diagnosis not present

## 2021-03-19 DIAGNOSIS — F419 Anxiety disorder, unspecified: Secondary | ICD-10-CM | POA: Diagnosis not present

## 2021-03-19 DIAGNOSIS — E559 Vitamin D deficiency, unspecified: Secondary | ICD-10-CM | POA: Diagnosis not present

## 2021-03-19 DIAGNOSIS — Z6829 Body mass index (BMI) 29.0-29.9, adult: Secondary | ICD-10-CM | POA: Diagnosis not present

## 2021-03-26 DIAGNOSIS — E559 Vitamin D deficiency, unspecified: Secondary | ICD-10-CM | POA: Diagnosis not present

## 2021-03-26 DIAGNOSIS — Z6829 Body mass index (BMI) 29.0-29.9, adult: Secondary | ICD-10-CM | POA: Diagnosis not present

## 2021-04-01 DIAGNOSIS — Z13820 Encounter for screening for osteoporosis: Secondary | ICD-10-CM | POA: Diagnosis not present

## 2021-04-01 DIAGNOSIS — R87615 Unsatisfactory cytologic smear of cervix: Secondary | ICD-10-CM | POA: Diagnosis not present

## 2021-04-01 DIAGNOSIS — R29890 Loss of height: Secondary | ICD-10-CM | POA: Diagnosis not present

## 2021-04-01 LAB — HM DEXA SCAN: HM Dexa Scan: NORMAL

## 2021-04-02 DIAGNOSIS — E7800 Pure hypercholesterolemia, unspecified: Secondary | ICD-10-CM | POA: Diagnosis not present

## 2021-04-02 DIAGNOSIS — F419 Anxiety disorder, unspecified: Secondary | ICD-10-CM | POA: Diagnosis not present

## 2021-04-02 DIAGNOSIS — Z6829 Body mass index (BMI) 29.0-29.9, adult: Secondary | ICD-10-CM | POA: Diagnosis not present

## 2021-04-02 LAB — HM PAP SMEAR

## 2021-04-05 MED ORDER — SUMATRIPTAN SUCCINATE 100 MG PO TABS: ORAL_TABLET | ORAL | 3 refills | Status: DC

## 2021-04-09 DIAGNOSIS — Z6829 Body mass index (BMI) 29.0-29.9, adult: Secondary | ICD-10-CM | POA: Diagnosis not present

## 2021-04-09 DIAGNOSIS — E7800 Pure hypercholesterolemia, unspecified: Secondary | ICD-10-CM | POA: Diagnosis not present

## 2021-04-23 DIAGNOSIS — F419 Anxiety disorder, unspecified: Secondary | ICD-10-CM | POA: Diagnosis not present

## 2021-05-07 DIAGNOSIS — F419 Anxiety disorder, unspecified: Secondary | ICD-10-CM | POA: Diagnosis not present

## 2021-05-07 DIAGNOSIS — Z6830 Body mass index (BMI) 30.0-30.9, adult: Secondary | ICD-10-CM | POA: Diagnosis not present

## 2021-05-07 DIAGNOSIS — E7800 Pure hypercholesterolemia, unspecified: Secondary | ICD-10-CM | POA: Diagnosis not present

## 2021-05-12 DIAGNOSIS — H11422 Conjunctival edema, left eye: Secondary | ICD-10-CM | POA: Diagnosis not present

## 2021-05-14 DIAGNOSIS — F419 Anxiety disorder, unspecified: Secondary | ICD-10-CM | POA: Diagnosis not present

## 2021-05-28 DIAGNOSIS — Z6830 Body mass index (BMI) 30.0-30.9, adult: Secondary | ICD-10-CM | POA: Diagnosis not present

## 2021-05-28 DIAGNOSIS — F419 Anxiety disorder, unspecified: Secondary | ICD-10-CM | POA: Diagnosis not present

## 2021-05-28 DIAGNOSIS — E559 Vitamin D deficiency, unspecified: Secondary | ICD-10-CM | POA: Diagnosis not present

## 2021-06-11 DIAGNOSIS — E7800 Pure hypercholesterolemia, unspecified: Secondary | ICD-10-CM | POA: Diagnosis not present

## 2021-06-11 DIAGNOSIS — F419 Anxiety disorder, unspecified: Secondary | ICD-10-CM | POA: Diagnosis not present

## 2021-06-11 DIAGNOSIS — Z6830 Body mass index (BMI) 30.0-30.9, adult: Secondary | ICD-10-CM | POA: Diagnosis not present

## 2021-06-25 DIAGNOSIS — E559 Vitamin D deficiency, unspecified: Secondary | ICD-10-CM | POA: Diagnosis not present

## 2021-06-25 DIAGNOSIS — Z6830 Body mass index (BMI) 30.0-30.9, adult: Secondary | ICD-10-CM | POA: Diagnosis not present

## 2021-06-25 DIAGNOSIS — F419 Anxiety disorder, unspecified: Secondary | ICD-10-CM | POA: Diagnosis not present

## 2021-07-02 DIAGNOSIS — F419 Anxiety disorder, unspecified: Secondary | ICD-10-CM | POA: Diagnosis not present

## 2021-07-09 DIAGNOSIS — E559 Vitamin D deficiency, unspecified: Secondary | ICD-10-CM | POA: Diagnosis not present

## 2021-07-09 DIAGNOSIS — Z6830 Body mass index (BMI) 30.0-30.9, adult: Secondary | ICD-10-CM | POA: Diagnosis not present

## 2021-07-09 DIAGNOSIS — F419 Anxiety disorder, unspecified: Secondary | ICD-10-CM | POA: Diagnosis not present

## 2021-07-16 DIAGNOSIS — F419 Anxiety disorder, unspecified: Secondary | ICD-10-CM | POA: Diagnosis not present

## 2021-07-16 DIAGNOSIS — Z6830 Body mass index (BMI) 30.0-30.9, adult: Secondary | ICD-10-CM | POA: Diagnosis not present

## 2021-07-16 DIAGNOSIS — E7800 Pure hypercholesterolemia, unspecified: Secondary | ICD-10-CM | POA: Diagnosis not present

## 2021-07-23 DIAGNOSIS — N951 Menopausal and female climacteric states: Secondary | ICD-10-CM | POA: Diagnosis not present

## 2021-07-23 DIAGNOSIS — Z6830 Body mass index (BMI) 30.0-30.9, adult: Secondary | ICD-10-CM | POA: Diagnosis not present

## 2021-07-23 DIAGNOSIS — F419 Anxiety disorder, unspecified: Secondary | ICD-10-CM | POA: Diagnosis not present

## 2021-07-23 DIAGNOSIS — E559 Vitamin D deficiency, unspecified: Secondary | ICD-10-CM | POA: Diagnosis not present

## 2021-08-06 DIAGNOSIS — F419 Anxiety disorder, unspecified: Secondary | ICD-10-CM | POA: Diagnosis not present

## 2021-08-13 DIAGNOSIS — E7800 Pure hypercholesterolemia, unspecified: Secondary | ICD-10-CM | POA: Diagnosis not present

## 2021-08-13 DIAGNOSIS — Z6830 Body mass index (BMI) 30.0-30.9, adult: Secondary | ICD-10-CM | POA: Diagnosis not present

## 2021-08-13 DIAGNOSIS — F419 Anxiety disorder, unspecified: Secondary | ICD-10-CM | POA: Diagnosis not present

## 2021-08-27 DIAGNOSIS — F419 Anxiety disorder, unspecified: Secondary | ICD-10-CM | POA: Diagnosis not present

## 2021-09-01 ENCOUNTER — Encounter: Payer: Self-pay | Admitting: Internal Medicine

## 2021-09-02 ENCOUNTER — Other Ambulatory Visit: Payer: Self-pay

## 2021-09-02 MED ORDER — SUMATRIPTAN SUCCINATE 100 MG PO TABS: ORAL_TABLET | ORAL | 3 refills | Status: DC

## 2021-09-17 DIAGNOSIS — F419 Anxiety disorder, unspecified: Secondary | ICD-10-CM | POA: Diagnosis not present

## 2021-09-24 DIAGNOSIS — F4322 Adjustment disorder with anxiety: Secondary | ICD-10-CM | POA: Diagnosis not present

## 2021-09-24 DIAGNOSIS — F419 Anxiety disorder, unspecified: Secondary | ICD-10-CM | POA: Diagnosis not present

## 2021-10-01 DIAGNOSIS — F419 Anxiety disorder, unspecified: Secondary | ICD-10-CM | POA: Diagnosis not present

## 2021-10-01 DIAGNOSIS — F4322 Adjustment disorder with anxiety: Secondary | ICD-10-CM | POA: Diagnosis not present

## 2021-10-08 DIAGNOSIS — E559 Vitamin D deficiency, unspecified: Secondary | ICD-10-CM | POA: Diagnosis not present

## 2021-10-08 DIAGNOSIS — Z6830 Body mass index (BMI) 30.0-30.9, adult: Secondary | ICD-10-CM | POA: Diagnosis not present

## 2021-10-08 DIAGNOSIS — F4322 Adjustment disorder with anxiety: Secondary | ICD-10-CM | POA: Diagnosis not present

## 2021-10-22 DIAGNOSIS — Z6830 Body mass index (BMI) 30.0-30.9, adult: Secondary | ICD-10-CM | POA: Diagnosis not present

## 2021-10-22 DIAGNOSIS — E7800 Pure hypercholesterolemia, unspecified: Secondary | ICD-10-CM | POA: Diagnosis not present

## 2021-10-22 DIAGNOSIS — F4322 Adjustment disorder with anxiety: Secondary | ICD-10-CM | POA: Diagnosis not present

## 2021-10-29 DIAGNOSIS — F419 Anxiety disorder, unspecified: Secondary | ICD-10-CM | POA: Diagnosis not present

## 2021-11-05 DIAGNOSIS — F4322 Adjustment disorder with anxiety: Secondary | ICD-10-CM | POA: Diagnosis not present

## 2021-11-12 DIAGNOSIS — F419 Anxiety disorder, unspecified: Secondary | ICD-10-CM | POA: Diagnosis not present

## 2021-12-03 DIAGNOSIS — F419 Anxiety disorder, unspecified: Secondary | ICD-10-CM | POA: Diagnosis not present

## 2021-12-07 DIAGNOSIS — F4322 Adjustment disorder with anxiety: Secondary | ICD-10-CM | POA: Diagnosis not present

## 2021-12-10 DIAGNOSIS — F419 Anxiety disorder, unspecified: Secondary | ICD-10-CM | POA: Diagnosis not present

## 2021-12-17 DIAGNOSIS — F419 Anxiety disorder, unspecified: Secondary | ICD-10-CM | POA: Diagnosis not present

## 2021-12-24 DIAGNOSIS — F4322 Adjustment disorder with anxiety: Secondary | ICD-10-CM | POA: Diagnosis not present

## 2021-12-24 DIAGNOSIS — F419 Anxiety disorder, unspecified: Secondary | ICD-10-CM | POA: Diagnosis not present

## 2022-01-07 DIAGNOSIS — F419 Anxiety disorder, unspecified: Secondary | ICD-10-CM | POA: Diagnosis not present

## 2022-01-14 DIAGNOSIS — F419 Anxiety disorder, unspecified: Secondary | ICD-10-CM | POA: Diagnosis not present

## 2022-01-26 DIAGNOSIS — M9903 Segmental and somatic dysfunction of lumbar region: Secondary | ICD-10-CM | POA: Diagnosis not present

## 2022-01-26 DIAGNOSIS — S335XXA Sprain of ligaments of lumbar spine, initial encounter: Secondary | ICD-10-CM | POA: Diagnosis not present

## 2022-01-26 DIAGNOSIS — M4316 Spondylolisthesis, lumbar region: Secondary | ICD-10-CM | POA: Diagnosis not present

## 2022-02-02 DIAGNOSIS — M4316 Spondylolisthesis, lumbar region: Secondary | ICD-10-CM | POA: Diagnosis not present

## 2022-02-02 DIAGNOSIS — S335XXA Sprain of ligaments of lumbar spine, initial encounter: Secondary | ICD-10-CM | POA: Diagnosis not present

## 2022-02-02 DIAGNOSIS — M9903 Segmental and somatic dysfunction of lumbar region: Secondary | ICD-10-CM | POA: Diagnosis not present

## 2022-02-07 DIAGNOSIS — M9903 Segmental and somatic dysfunction of lumbar region: Secondary | ICD-10-CM | POA: Diagnosis not present

## 2022-02-07 DIAGNOSIS — M4316 Spondylolisthesis, lumbar region: Secondary | ICD-10-CM | POA: Diagnosis not present

## 2022-02-07 DIAGNOSIS — S335XXA Sprain of ligaments of lumbar spine, initial encounter: Secondary | ICD-10-CM | POA: Diagnosis not present

## 2022-02-09 DIAGNOSIS — M9903 Segmental and somatic dysfunction of lumbar region: Secondary | ICD-10-CM | POA: Diagnosis not present

## 2022-02-09 DIAGNOSIS — S335XXA Sprain of ligaments of lumbar spine, initial encounter: Secondary | ICD-10-CM | POA: Diagnosis not present

## 2022-02-09 DIAGNOSIS — M4316 Spondylolisthesis, lumbar region: Secondary | ICD-10-CM | POA: Diagnosis not present

## 2022-02-11 DIAGNOSIS — F4322 Adjustment disorder with anxiety: Secondary | ICD-10-CM | POA: Diagnosis not present

## 2022-02-14 DIAGNOSIS — M9903 Segmental and somatic dysfunction of lumbar region: Secondary | ICD-10-CM | POA: Diagnosis not present

## 2022-02-14 DIAGNOSIS — M4316 Spondylolisthesis, lumbar region: Secondary | ICD-10-CM | POA: Diagnosis not present

## 2022-02-14 DIAGNOSIS — S335XXA Sprain of ligaments of lumbar spine, initial encounter: Secondary | ICD-10-CM | POA: Diagnosis not present

## 2022-02-17 DIAGNOSIS — M9903 Segmental and somatic dysfunction of lumbar region: Secondary | ICD-10-CM | POA: Diagnosis not present

## 2022-02-17 DIAGNOSIS — M4316 Spondylolisthesis, lumbar region: Secondary | ICD-10-CM | POA: Diagnosis not present

## 2022-02-17 DIAGNOSIS — S335XXA Sprain of ligaments of lumbar spine, initial encounter: Secondary | ICD-10-CM | POA: Diagnosis not present

## 2022-02-18 DIAGNOSIS — F419 Anxiety disorder, unspecified: Secondary | ICD-10-CM | POA: Diagnosis not present

## 2022-02-21 DIAGNOSIS — M9903 Segmental and somatic dysfunction of lumbar region: Secondary | ICD-10-CM | POA: Diagnosis not present

## 2022-02-21 DIAGNOSIS — S335XXA Sprain of ligaments of lumbar spine, initial encounter: Secondary | ICD-10-CM | POA: Diagnosis not present

## 2022-02-21 DIAGNOSIS — M4316 Spondylolisthesis, lumbar region: Secondary | ICD-10-CM | POA: Diagnosis not present

## 2022-02-23 DIAGNOSIS — M4316 Spondylolisthesis, lumbar region: Secondary | ICD-10-CM | POA: Diagnosis not present

## 2022-02-23 DIAGNOSIS — S335XXA Sprain of ligaments of lumbar spine, initial encounter: Secondary | ICD-10-CM | POA: Diagnosis not present

## 2022-02-23 DIAGNOSIS — M9903 Segmental and somatic dysfunction of lumbar region: Secondary | ICD-10-CM | POA: Diagnosis not present

## 2022-02-25 DIAGNOSIS — M21961 Unspecified acquired deformity of right lower leg: Secondary | ICD-10-CM | POA: Diagnosis not present

## 2022-02-25 DIAGNOSIS — S99921A Unspecified injury of right foot, initial encounter: Secondary | ICD-10-CM | POA: Diagnosis not present

## 2022-02-25 DIAGNOSIS — F419 Anxiety disorder, unspecified: Secondary | ICD-10-CM | POA: Diagnosis not present

## 2022-02-28 DIAGNOSIS — S335XXA Sprain of ligaments of lumbar spine, initial encounter: Secondary | ICD-10-CM | POA: Diagnosis not present

## 2022-02-28 DIAGNOSIS — M9903 Segmental and somatic dysfunction of lumbar region: Secondary | ICD-10-CM | POA: Diagnosis not present

## 2022-02-28 DIAGNOSIS — M4316 Spondylolisthesis, lumbar region: Secondary | ICD-10-CM | POA: Diagnosis not present

## 2022-03-03 DIAGNOSIS — M9903 Segmental and somatic dysfunction of lumbar region: Secondary | ICD-10-CM | POA: Diagnosis not present

## 2022-03-03 DIAGNOSIS — S335XXA Sprain of ligaments of lumbar spine, initial encounter: Secondary | ICD-10-CM | POA: Diagnosis not present

## 2022-03-03 DIAGNOSIS — M4316 Spondylolisthesis, lumbar region: Secondary | ICD-10-CM | POA: Diagnosis not present

## 2022-03-04 DIAGNOSIS — F419 Anxiety disorder, unspecified: Secondary | ICD-10-CM | POA: Diagnosis not present

## 2022-03-09 DIAGNOSIS — S335XXA Sprain of ligaments of lumbar spine, initial encounter: Secondary | ICD-10-CM | POA: Diagnosis not present

## 2022-03-09 DIAGNOSIS — M9903 Segmental and somatic dysfunction of lumbar region: Secondary | ICD-10-CM | POA: Diagnosis not present

## 2022-03-09 DIAGNOSIS — M4316 Spondylolisthesis, lumbar region: Secondary | ICD-10-CM | POA: Diagnosis not present

## 2022-03-11 DIAGNOSIS — F4322 Adjustment disorder with anxiety: Secondary | ICD-10-CM | POA: Diagnosis not present

## 2022-03-11 DIAGNOSIS — F419 Anxiety disorder, unspecified: Secondary | ICD-10-CM | POA: Diagnosis not present

## 2022-03-16 DIAGNOSIS — M4316 Spondylolisthesis, lumbar region: Secondary | ICD-10-CM | POA: Diagnosis not present

## 2022-03-16 DIAGNOSIS — S335XXA Sprain of ligaments of lumbar spine, initial encounter: Secondary | ICD-10-CM | POA: Diagnosis not present

## 2022-03-16 DIAGNOSIS — Z23 Encounter for immunization: Secondary | ICD-10-CM | POA: Diagnosis not present

## 2022-03-16 DIAGNOSIS — M9903 Segmental and somatic dysfunction of lumbar region: Secondary | ICD-10-CM | POA: Diagnosis not present

## 2022-03-25 DIAGNOSIS — F4322 Adjustment disorder with anxiety: Secondary | ICD-10-CM | POA: Diagnosis not present

## 2022-03-25 DIAGNOSIS — F419 Anxiety disorder, unspecified: Secondary | ICD-10-CM | POA: Diagnosis not present

## 2022-04-01 DIAGNOSIS — Z1231 Encounter for screening mammogram for malignant neoplasm of breast: Secondary | ICD-10-CM | POA: Diagnosis not present

## 2022-04-01 DIAGNOSIS — Z01419 Encounter for gynecological examination (general) (routine) without abnormal findings: Secondary | ICD-10-CM | POA: Diagnosis not present

## 2022-04-01 DIAGNOSIS — Z6830 Body mass index (BMI) 30.0-30.9, adult: Secondary | ICD-10-CM | POA: Diagnosis not present

## 2022-04-04 LAB — HM MAMMOGRAPHY

## 2022-04-08 DIAGNOSIS — F419 Anxiety disorder, unspecified: Secondary | ICD-10-CM | POA: Diagnosis not present

## 2022-04-12 ENCOUNTER — Ambulatory Visit: Payer: BC Managed Care – PPO | Admitting: Podiatry

## 2022-04-12 ENCOUNTER — Ambulatory Visit (INDEPENDENT_AMBULATORY_CARE_PROVIDER_SITE_OTHER): Payer: BC Managed Care – PPO

## 2022-04-12 DIAGNOSIS — M21961 Unspecified acquired deformity of right lower leg: Secondary | ICD-10-CM | POA: Diagnosis not present

## 2022-04-12 DIAGNOSIS — M778 Other enthesopathies, not elsewhere classified: Secondary | ICD-10-CM

## 2022-04-12 DIAGNOSIS — S99921A Unspecified injury of right foot, initial encounter: Secondary | ICD-10-CM | POA: Diagnosis not present

## 2022-04-12 DIAGNOSIS — M2041 Other hammer toe(s) (acquired), right foot: Secondary | ICD-10-CM

## 2022-04-12 NOTE — Patient Instructions (Signed)
Call [facility] to schedule your MRI at the below locations.  Please allow at least 1 business day after your visit to process the referral.  It may take longer depending on approval from insurance.  Please let me know if you have issues or problems scheduling the MRI     [facility] [phone] [address] [place],  [address]  

## 2022-04-12 NOTE — Progress Notes (Signed)
  Subjective:  Patient ID: Debra [name], female    DOB: 25-Dec-1965,  MRN: [id number]  Chief Complaint  Patient presents with   Foot Injury    NP- R foot has pain on the ball and cant to move 2nd toe.She had xrays a few weeks ago and an ultrasound - previous doctor thought she may have a plantar plate injury    55 y.o. female presents with the above complaint. History confirmed with patient.  The ultrasound was done in the office and they are concerned that she had a plantar plate tear.  She said is being on for several months she is done a few things to improvement including a Budin type splint  Objective:  Physical Exam: warm, good capillary refill, no trophic changes or ulcerative lesions, normal DP and PT pulses, normal sensory exam, and pain on palpation to plantar right second MTPJ, negative Lachman's test, edema and tenderness here   Radiographs: Multiple views x-ray of the right foot: Slight metatarsus adductus, she does have an extended second metatarsal and second ray compared to the first and third Assessment:   1. Capsulitis of foot, right   2. Injury of plantar plate, right, initial encounter   3. Deformity of metatarsal bone of right foot   4. Hammertoe of right foot      Plan:  Patient was evaluated and treated and all questions answered.  Clinically and radiographically her pain is consistent with predislocation syndrome and plantar plate tear.  I discussed with her that I would recommend an MRI to evaluate further.  She does have some pain in the first MTP joint as well and this will evaluate this.  We discussed nonoperative and operative treatment.  So far she has done most of the nonoperative treatment that is reasonable.  I discussed with her that likely she will end up needing surgical correction of this.  MRI will be used for surgical planning I will see her back to plan this.  Return in about 3 weeks (around 05/03/2022) for after MRI to review.

## 2022-04-13 ENCOUNTER — Encounter: Payer: Self-pay | Admitting: Podiatry

## 2022-04-18 ENCOUNTER — Ambulatory Visit
Admission: RE | Admit: 2022-04-18 | Discharge: 2022-04-18 | Disposition: A | Discharge status: Home / Self Care | Payer: BC Managed Care – PPO | Source: Ambulatory Visit | Attending: Podiatry | Admitting: Podiatry

## 2022-04-18 DIAGNOSIS — R600 Localized edema: Secondary | ICD-10-CM | POA: Diagnosis not present

## 2022-04-18 DIAGNOSIS — S99921A Unspecified injury of right foot, initial encounter: Secondary | ICD-10-CM

## 2022-04-22 DIAGNOSIS — F419 Anxiety disorder, unspecified: Secondary | ICD-10-CM | POA: Diagnosis not present

## 2022-04-29 DIAGNOSIS — F419 Anxiety disorder, unspecified: Secondary | ICD-10-CM | POA: Diagnosis not present

## 2022-05-01 ENCOUNTER — Other Ambulatory Visit: Payer: Self-pay | Admitting: Internal Medicine

## 2022-05-05 ENCOUNTER — Ambulatory Visit: Payer: BC Managed Care – PPO | Admitting: Podiatry

## 2022-05-05 DIAGNOSIS — S99921A Unspecified injury of right foot, initial encounter: Secondary | ICD-10-CM | POA: Diagnosis not present

## 2022-05-05 DIAGNOSIS — M778 Other enthesopathies, not elsewhere classified: Secondary | ICD-10-CM | POA: Diagnosis not present

## 2022-05-05 NOTE — Progress Notes (Signed)
  Subjective:  Patient ID: Debra [name], female    DOB: 21-Jun-1965,  MRN: [id number]  Chief Complaint  Patient presents with   Foot Pain     3 week follow up for  MRI  review. Ok per [employer]    56 y.o. female presents with the above complaint. History confirmed with patient.  She returns for follow-up she completed the MRI the pain is still giving her trouble  Objective:  Physical Exam: warm, good capillary refill, no trophic changes or ulcerative lesions, normal DP and PT pulses, normal sensory exam, and pain on palpation to plantar right second MTPJ, also with dorsal capsular stretch in plantarflexion, negative Lachman's test, mild edema and tenderness here   Radiographs: Multiple views x-ray of the right foot: Slight metatarsus adductus, she does have an extended second metatarsal and second ray compared to the first and third  MRI 04/18/2022 IMPRESSION: 1. Small effusion of the second MTP joint with some endosteal edema in the second metatarsal head. No definite plantar plate injury is identified, but there is second digit flexor tendon tenosynovitis and peritendinitis. 2. Small dorsal effusion of the first MTP joint. No findings of first digit turf toe.     Electronically Signed   By: Van Clines M.D.   On: 04/18/2022 18:57 Assessment:   1. Capsulitis of foot, right   2. Injury of plantar plate, right, initial encounter      Plan:  Patient was evaluated and treated and all questions answered.  We reviewed the results of her MRI, we discussed the presence of the bony edema and joint effusion.  We discussed further treatment of this including injection therapy as well as surgical treatment.  I think she will do well with injection therapy and rest and immobilization.  Following verbal consent and prepped with Betadine the second MTPJ and capsule of the dorsal second toe was injected with 4 mg of dexamethasone and 0.5 cc of Marcaine half percent plain.  We  discussed resting with a cam walker boot which was dispensed today.  Strapping of the toes was also completed and plantarflexion under device was dispensed in order to allow her to do this at home.  I recommend she do this for 4 weeks.  If she has improvement in pain she may return to regular supportive shoe gear as tolerated.  If no improvement after this point then I would recommend surgical treatment with Weil osteotomy and possible repair of the plantar plate if insufficient.  She will let me know how she is doing.  She tolerated the procedure well today.  Return if symptoms worsen or fail to improve.

## 2022-05-10 ENCOUNTER — Other Ambulatory Visit: Payer: Self-pay

## 2022-05-10 ENCOUNTER — Encounter: Payer: Self-pay | Admitting: Internal Medicine

## 2022-05-10 DIAGNOSIS — G43109 Migraine with aura, not intractable, without status migrainosus: Secondary | ICD-10-CM

## 2022-05-10 MED ORDER — SUMATRIPTAN SUCCINATE 100 MG PO TABS: ORAL_TABLET | ORAL | 0 refills | Status: DC

## 2022-05-13 ENCOUNTER — Encounter: Payer: Self-pay | Admitting: Podiatry

## 2022-05-13 DIAGNOSIS — F419 Anxiety disorder, unspecified: Secondary | ICD-10-CM | POA: Diagnosis not present

## 2022-05-27 DIAGNOSIS — F419 Anxiety disorder, unspecified: Secondary | ICD-10-CM | POA: Diagnosis not present

## 2022-06-03 DIAGNOSIS — F419 Anxiety disorder, unspecified: Secondary | ICD-10-CM | POA: Diagnosis not present

## 2022-06-09 ENCOUNTER — Ambulatory Visit (INDEPENDENT_AMBULATORY_CARE_PROVIDER_SITE_OTHER): Payer: BC Managed Care – PPO | Admitting: Adult Health

## 2022-06-09 ENCOUNTER — Encounter: Payer: Self-pay | Admitting: Adult Health

## 2022-06-09 ENCOUNTER — Other Ambulatory Visit: Payer: Self-pay | Admitting: Adult Health

## 2022-06-09 VITALS — BP 110/80 | HR 70 | Temp 98.0°F | Wt 201.0 lb

## 2022-06-09 DIAGNOSIS — G8929 Other chronic pain: Secondary | ICD-10-CM

## 2022-06-09 DIAGNOSIS — R109 Unspecified abdominal pain: Secondary | ICD-10-CM | POA: Diagnosis not present

## 2022-06-09 DIAGNOSIS — R10A2 Flank pain, left side: Secondary | ICD-10-CM

## 2022-06-09 DIAGNOSIS — M545 Low back pain, unspecified: Secondary | ICD-10-CM | POA: Diagnosis not present

## 2022-06-09 DIAGNOSIS — K219 Gastro-esophageal reflux disease without esophagitis: Secondary | ICD-10-CM

## 2022-06-09 LAB — CBC WITH DIFFERENTIAL/PLATELET
Basophils Absolute: 0.1 10*3/uL (ref 0.0–0.1)
Basophils Relative: 1.1 % (ref 0.0–3.0)
Eosinophils Absolute: 0.3 10*3/uL (ref 0.0–0.7)
Eosinophils Relative: 3.6 % (ref 0.0–5.0)
HCT: 40.2 % (ref 36.0–46.0)
Hemoglobin: 13.7 g/dL (ref 12.0–15.0)
Lymphocytes Relative: 22.1 % (ref 12.0–46.0)
Lymphs Abs: 1.7 10*3/uL (ref 0.7–4.0)
MCHC: 34.1 g/dL (ref 30.0–36.0)
MCV: 93.9 fl (ref 78.0–100.0)
Monocytes Absolute: 0.5 10*3/uL (ref 0.1–1.0)
Monocytes Relative: 7.1 % (ref 3.0–12.0)
Neutro Abs: 5.1 10*3/uL (ref 1.4–7.7)
Neutrophils Relative %: 66.1 % (ref 43.0–77.0)
Platelets: 283 10*3/uL (ref 150.0–400.0)
RBC: 4.28 Mil/uL (ref 3.87–5.11)
RDW: 13 % (ref 11.5–15.5)
WBC: 7.7 10*3/uL (ref 4.0–10.5)

## 2022-06-09 LAB — COMPREHENSIVE METABOLIC PANEL
ALT: 13 U/L (ref 0–35)
AST: 20 U/L (ref 0–37)
Albumin: 4.3 g/dL (ref 3.5–5.2)
Alkaline Phosphatase: 46 U/L (ref 39–117)
BUN: 15 mg/dL (ref 6–23)
CO2: 26 mEq/L (ref 19–32)
Calcium: 8.6 mg/dL (ref 8.4–10.5)
Chloride: 102 mEq/L (ref 96–112)
Creatinine, Ser: 0.73 mg/dL (ref 0.40–1.20)
GFR: 92.49 mL/min (ref >60.00)
Glucose, Bld: 95 mg/dL (ref 70–99)
Potassium: 4.2 mEq/L (ref 3.5–5.1)
Sodium: 137 mEq/L (ref 135–145)
Total Bilirubin: 0.4 mg/dL (ref 0.2–1.2)
Total Protein: 7.2 g/dL (ref 6.0–8.3)

## 2022-06-09 LAB — URINALYSIS
Bilirubin Urine: NEGATIVE
Hgb urine dipstick: NEGATIVE
Ketones, ur: NEGATIVE
Leukocytes,Ua: NEGATIVE
Nitrite: NEGATIVE
Specific Gravity, Urine: 1.02 (ref 1.000–1.030)
Total Protein, Urine: NEGATIVE
Urine Glucose: NEGATIVE
Urobilinogen, UA: 0.2 (ref 0.0–1.0)
pH: 6.5 (ref 5.0–8.0)

## 2022-06-09 LAB — AMYLASE: Amylase: 35 U/L (ref 27–131)

## 2022-06-09 LAB — LIPASE: Lipase: 25 U/L (ref 11.0–59.0)

## 2022-06-09 MED ORDER — OMEPRAZOLE 40 MG PO CPDR: 40.0000 mg | DELAYED_RELEASE_CAPSULE | Freq: Every day | ORAL | 1 refills | Status: DC

## 2022-06-09 MED ORDER — CYCLOBENZAPRINE HCL 5 MG PO TABS: 5.0000 mg | ORAL_TABLET | Freq: Three times a day (TID) | ORAL | 0 refills | Status: DC | PRN

## 2022-06-09 NOTE — Progress Notes (Signed)
Subjective:    Patient ID: Debra [name], female    DOB: [birth year], 57 y.o.   MRN: [id number]  HPI  57 year old female who  has a past medical history of Abdominal pain, epigastric (01/06/2010), Chronic headaches, Esophagitis, ESOPHAGITIS, HX OF (02/18/2010), EXTERNAL HEMORRHOIDS (02/18/2010), FATIGUE (04/13/2007), Gallstones, GERD (03/13/2007), Headache(784.0) (03/13/2007), HIP PAIN, RIGHT (02/23/2009), LIVER FUNCTION TESTS, ABNORMAL (01/06/2010), LOW BACK PAIN, MILD (08/06/2008), Ovarian cyst, Pancreatitis (12/18/09), PANCREATITIS, ACUTE (02/18/2010), PARESTHESIA (03/13/2007), Pulmonary nodule, Ruptured ovarian cyst, and VARICOSE VEIN (01/06/2010).  She is a patient of Dr. Regis Bill who I am seeing today for multiple issues.   She reports pain that starts under her left breast and radiates along her left flank that has been present for the last week. Pain is constant and is described as an achy pain. She has not noticed any rashes, fevers, chills, or urinary symptoms. Has a history of GERD for which she has taken Dexilant as needed in the past for.  She also has a remote history of pancreatitis.  She no longer has her gallbladder  She also reports that over the last week she has been bloated, had nausea and diarrhea.   Additionally, she has been experiencing left sided low back pain for the last few months. No sciatica symptoms.   Review of Systems See HPI   Past Medical History:  Diagnosis Date   Abdominal pain, epigastric 01/06/2010   Chronic headaches    Esophagitis    ESOPHAGITIS, HX OF 02/18/2010   EXTERNAL HEMORRHOIDS 02/18/2010   FATIGUE 04/13/2007   Gallstones    GERD 03/13/2007   Headache(784.0) 03/13/2007   HIP PAIN, RIGHT 02/23/2009   LIVER FUNCTION TESTS, ABNORMAL 01/06/2010   LOW BACK PAIN, MILD 08/06/2008   Ovarian cyst    Pancreatitis 12/18/09   PANCREATITIS, ACUTE 02/18/2010   Qualifier: Diagnosis of  By: Harlon Ditty CMA (AAMA), Dottie     PARESTHESIA 03/13/2007    Pulmonary nodule    incidental on ct 2007  neg cxray 2011   Ruptured ovarian cyst    VARICOSE VEIN 01/06/2010    Social History   Socioeconomic History   Marital status: Married    Spouse name: Not on file   Number of children: 0   Years of education: Not on file   Highest education level: Not on file  Occupational History   Occupation: PHARMACIST    Employer: [employer]  Tobacco Use   Smoking status: Never   Smokeless tobacco: Never  Vaping Use   Vaping Use: Never used  Substance and Sexual Activity   Alcohol use: Yes    Comment: rarely   Drug use: Never   Sexual activity: Not on file  Other Topics Concern   Not on file  Social History Narrative   Dogs 2     married is a Software engineer never smoked.  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 [employer]    hx fertility treatment    hh of 3   6- 8 hours  Sleep    3 diet coke per day   2 etoh per month    4 yoyear okd  adopted at 7 weeks  gracie    Building house                   Social Determinants of Health   Financial Resource Strain: Not on file  Food Insecurity: Not on file  Transportation Needs: Not on file  Physical Activity: Not on file  Stress: Not on file  Social Connections: Not on file  Intimate Partner Violence: Not on file    Past Surgical History:  Procedure Laterality Date   CHOLECYSTECTOMY     ENDOVENOUS ABLATION SAPHENOUS VEIN W/ LASER Left 07/02/2020   endovenous laser ablation left greater saphenous vein and stab phlebectomy 10-20 incisions left leg by Gae Gallop MD    TONSILLECTOMY     Uterine Polyps      Family History  Problem Relation Age of Onset   Alcohol abuse Father    Cardiomyopathy Father    Anemia Father    Heart disease Father    Other Father        Mertha Baars. Heart Beat   Lung cancer Mother    Diabetes Mother    Other Brother        Irr. Heart Beat   Pancreatitis Maternal Grandfather    Colon cancer Maternal Grandmother    Esophageal cancer Neg Hx    Rectal cancer  Neg Hx    Stomach cancer Neg Hx     Allergies  Allergen Reactions   Gadolinium      Code: HIVES, Desc: Multihance--pt had hives, Onset Date: [id number] IVP Dye   Penicillins     REACTION: rash   Sulfonamide Derivatives     REACTION: rash    Current Outpatient Medications on File Prior to Visit  Medication Sig Dispense Refill   Cholecalciferol (VITAMIN D) 50 MCG (2000 UT) CAPS Vitamin D     dexlansoprazole (DEXILANT) 60 MG capsule as needed.     ibuprofen (ADVIL,MOTRIN) 200 MG tablet Take 600 mg by mouth every 6 (six) hours as needed for moderate pain.     SUMAtriptan (IMITREX) 100 MG tablet TAKE 1 TABLET BY MOUTH IMMEDIATELY MAY REPEAT AFTER 2 HOURS--Schedule yearly visit with labs for refills. [phone] 9 tablet 0   Current Facility-Administered Medications on File Prior to Visit  Medication Dose Route Frequency Provider Last Rate Last Admin   0.9 %  sodium chloride infusion  500 mL Intravenous Once Danis, Estill Cotta III, MD        BP 110/80   Pulse 70   Temp 98 F (36.7 C) (Oral)   Wt 201 lb (91.2 kg)   SpO2 99%   BMI 30.56 kg/m       Objective:   Physical Exam Vitals and nursing note reviewed.  Constitutional:      Appearance: Normal appearance.  Cardiovascular:     Rate and Rhythm: Regular rhythm.     Pulses: Normal pulses.     Heart sounds: Normal heart sounds.  Pulmonary:     Effort: Pulmonary effort is normal.     Breath sounds: Normal breath sounds.  Abdominal:     General: Abdomen is flat. Bowel sounds are normal. There is no distension.     Palpations: Abdomen is soft. There is no mass.     Tenderness: There is abdominal tenderness in the epigastric area. There is no right CVA tenderness, left CVA tenderness or rebound.     Hernia: No hernia is present.  Musculoskeletal:        General: Tenderness (left lower paraspinal muscles) present. Normal range of motion.  Skin:    General: Skin is warm and dry.     Capillary Refill: Capillary refill takes less  than 2 seconds.     Findings: No rash.  Neurological:     General: No focal deficit present.     Mental Status: She is  alert.  Psychiatric:        Mood and Affect: Mood normal.        Behavior: Behavior normal.        Thought Content: Thought content normal.        Judgment: Judgment normal.       Assessment & Plan:  1. Left flank pain -No signs of shingles, no pain with palpation.  She not look acutely ill.  Doubt pancreatitis but will check labs.  Also rule out urinary tract infection and possible kidney stones.  If lab work comes back normal and no improvement over the week we will likely order CT scan. - EKG 12-Lead- NSR, rate 60  - Urinalysis; Future - Comprehensive metabolic panel; Future - CBC with Differential/Platelet; Future - Amylase; Future - Lipase; Future - Lipase - Amylase - CBC with Differential/Platelet - Comprehensive metabolic panel - Urinalysis  2. Gastroesophageal reflux disease without esophagitis  - omeprazole (PRILOSEC) 40 MG capsule; Take 1 capsule (40 mg total) by mouth daily.  Dispense: 30 capsule; Refill: 1  3. Chronic left-sided low back pain without sciatica - appears to be muscular. Will provide short course of flexeril. She was warned about the sedating effects  - cyclobenzaprine (FLEXERIL) 5 MG tablet; Take 1 tablet (5 mg total) by mouth 3 (three) times daily as needed for muscle spasms.  Dispense: 15 tablet; Refill: 0  Dorothyann Peng, NP

## 2022-06-10 ENCOUNTER — Telehealth: Payer: Self-pay | Admitting: Internal Medicine

## 2022-06-10 DIAGNOSIS — F419 Anxiety disorder, unspecified: Secondary | ICD-10-CM | POA: Diagnosis not present

## 2022-06-10 MED ORDER — PANTOPRAZOLE SODIUM 40 MG PO TBEC: 40.0000 mg | DELAYED_RELEASE_TABLET | Freq: Every day | ORAL | 1 refills | Status: DC

## 2022-06-10 NOTE — Telephone Encounter (Signed)
Pt is calling and would like blood work results 

## 2022-06-10 NOTE — Telephone Encounter (Signed)
Patient notified of update  and verbalized understanding. 

## 2022-06-17 DIAGNOSIS — F419 Anxiety disorder, unspecified: Secondary | ICD-10-CM | POA: Diagnosis not present

## 2022-06-24 DIAGNOSIS — F419 Anxiety disorder, unspecified: Secondary | ICD-10-CM | POA: Diagnosis not present

## 2022-07-08 DIAGNOSIS — F419 Anxiety disorder, unspecified: Secondary | ICD-10-CM | POA: Diagnosis not present

## 2022-07-15 DIAGNOSIS — F419 Anxiety disorder, unspecified: Secondary | ICD-10-CM | POA: Diagnosis not present

## 2022-07-21 ENCOUNTER — Encounter: Payer: BC Managed Care – PPO | Admitting: Internal Medicine

## 2022-07-25 NOTE — Progress Notes (Unsigned)
No chief complaint on file.   HPI: Patient  Debra [name]  57 y.o. comes in today for [facility] visit   Health Maintenance  Topic Date Due   DTaP/Tdap/Td (1 - Tdap) Never done   Zoster Vaccines- Shingrix (1 of 2) Never done   MAMMOGRAM  Never done   PAP SMEAR-Modifier  03/13/2017   COVID-19 Vaccine (4 - 2023-24 season) 01/21/2022   COLONOSCOPY (Pts 45-23yr Insurance coverage will need to be confirmed)  12/06/2028   INFLUENZA VACCINE  Completed   Hepatitis C Screening  Completed   HIV Screening  Completed   HPV VACCINES  Aged Out   Health Maintenance Review LIFESTYLE:  Exercise:   Tobacco/ETS: Alcohol:  Sugar beverages: Sleep: Drug use: no HH of  Work:    ROS:  GEN/ HEENT: No fever, significant weight changes sweats headaches vision problems hearing changes, CV/ PULM; No chest pain shortness of breath cough, syncope,edema  change in exercise tolerance. GI /GU: No adominal pain, vomiting, change in bowel habits. No blood in the stool. No significant GU symptoms. SKIN/HEME: ,no acute skin rashes suspicious lesions or bleeding. No lymphadenopathy, nodules, masses.  NEURO/ PSYCH:  No neurologic signs such as weakness numbness. No depression anxiety. IMM/ Allergy: No unusual infections.  Allergy .   REST of 12 system review negative except as per HPI   Past Medical History:  Diagnosis Date   Abdominal pain, epigastric 01/06/2010   Chronic headaches    Esophagitis    ESOPHAGITIS, HX OF 02/18/2010   EXTERNAL HEMORRHOIDS 02/18/2010   FATIGUE 04/13/2007   Gallstones    GERD 03/13/2007   Headache(784.0) 03/13/2007   HIP PAIN, RIGHT 02/23/2009   LIVER FUNCTION TESTS, ABNORMAL 01/06/2010   LOW BACK PAIN, MILD 08/06/2008   Ovarian cyst    Pancreatitis 12/18/09   PANCREATITIS, ACUTE 02/18/2010   Qualifier: Diagnosis of  By: Nelson-Smith CMA (AAMA), Dottie     PARESTHESIA 03/13/2007   Pulmonary nodule    incidental on ct 2007  neg cxray 2011   Ruptured  ovarian cyst    VARICOSE VEIN 01/06/2010    Past Surgical History:  Procedure Laterality Date   CHOLECYSTECTOMY     ENDOVENOUS ABLATION SAPHENOUS VEIN W/ LASER Left 07/02/2020   endovenous laser ablation left greater saphenous vein and stab phlebectomy 10-20 incisions left leg by CGae GallopMD    TONSILLECTOMY     Uterine Polyps      Family History  Problem Relation Age of Onset   Alcohol abuse Father    Cardiomyopathy Father    Anemia Father    Heart disease Father    Other Father        IMertha Baars Heart Beat   Lung cancer Mother    Diabetes Mother    Other Brother        Irr. Heart Beat   Pancreatitis Maternal Grandfather    Colon cancer Maternal Grandmother    Esophageal cancer Neg Hx    Rectal cancer Neg Hx    Stomach cancer Neg Hx     Social History   Socioeconomic History   Marital status: Married    Spouse name: Not on file   Number of children: 0   Years of education: Not on file   Highest education level: Not on file  Occupational History   Occupation: PHARMACIST    Employer: [employer]  Tobacco Use   Smoking status: Never   Smokeless tobacco: Never  Vaping Use  Vaping Use: Never used  Substance and Sexual Activity   Alcohol use: Yes    Comment: rarely   Drug use: Never   Sexual activity: Not on file  Other Topics Concern   Not on file  Social History Narrative   Dogs 2     married is a Software engineer never smoked.  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 [employer]    hx fertility treatment    hh of 3   6- 8 hours  Sleep    3 diet coke per day   2 etoh per month    4 yoyear okd  adopted at 7 weeks  gracie    Building house                   Social Determinants of Health   Financial Resource Strain: Not on file  Food Insecurity: Not on file  Transportation Needs: Not on file  Physical Activity: Not on file  Stress: Not on file  Social Connections: Not on file    Outpatient Medications Prior to Visit  Medication Sig Dispense Refill    Cholecalciferol (VITAMIN D) 50 MCG (2000 UT) CAPS Vitamin D     cyclobenzaprine (FLEXERIL) 5 MG tablet Take 1 tablet (5 mg total) by mouth 3 (three) times daily as needed for muscle spasms. 15 tablet 0   dexlansoprazole (DEXILANT) 60 MG capsule as needed.     ibuprofen (ADVIL,MOTRIN) 200 MG tablet Take 600 mg by mouth every 6 (six) hours as needed for moderate pain.     pantoprazole (PROTONIX) 40 MG tablet Take 1 tablet (40 mg total) by mouth daily. 90 tablet 1   SUMAtriptan (IMITREX) 100 MG tablet TAKE 1 TABLET BY MOUTH IMMEDIATELY MAY REPEAT AFTER 2 HOURS--Schedule yearly visit with labs for refills. [phone] 9 tablet 0   Facility-Administered Medications Prior to Visit  Medication Dose Route Frequency Provider Last Rate Last Admin   0.9 %  sodium chloride infusion  500 mL Intravenous Once Doran Stabler, MD         EXAM:  There were no vitals taken for this visit.  There is no height or weight on file to calculate BMI. Wt Readings from Last 3 Encounters:  06/09/22 201 lb (91.2 kg)  08/19/20 195 lb 9.6 oz (88.7 kg)  07/02/20 195 lb (88.5 kg)    Physical Exam: Vital signs reviewed [id number] is a well-developed well-nourished alert cooperative    who appearsr stated age in no acute distress.  HEENT: normocephalic atraumatic , Eyes: PERRL EOM's full, conjunctiva clear, Nares: paten,t no deformity discharge or tenderness., Ears: no deformity EAC's clear TMs with normal landmarks. Mouth: clear OP, no lesions, edema.  Moist mucous membranes. Dentition in adequate repair. NECK: supple without masses, thyromegaly or bruits. CHEST/PULM:  Clear to auscultation and percussion breath sounds equal no wheeze , rales or rhonchi. No chest wall deformities or tenderness. Breast: normal by inspection . No dimpling, discharge, masses, tenderness or discharge . CV: PMI is nondisplaced, S1 S2 no gallops, murmurs, rubs. Peripheral pulses are full without delay.No JVD .  ABDOMEN: Bowel sounds  normal nontender  No [name] or rebound, no hepato splenomegal no CVA tenderness.  No hernia. Extremtities:  No clubbing cyanosis or edema, no acute joint swelling or redness no focal atrophy NEURO:  Oriented x3, cranial nerves 3-12 appear to be intact, no obvious focal weakness,gait within normal limits no abnormal reflexes or asymmetrical SKIN: No acute rashes normal turgor, color, no  bruising or petechiae. PSYCH: Oriented, good eye contact, no obvious depression anxiety, cognition and judgment appear normal. LN: no cervical axillary inguinal adenopathy  Lab Results  Component Value Date   WBC 7.7 06/09/2022   HGB 13.7 06/09/2022   HCT 40.2 06/09/2022   PLT 283.0 06/09/2022   GLUCOSE 95 06/09/2022   CHOL 193 08/19/2020   TRIG 56.0 08/19/2020   HDL 55.60 08/19/2020   LDLCALC 126 (H) 08/19/2020   ALT 13 06/09/2022   AST 20 06/09/2022   NA 137 06/09/2022   K 4.2 06/09/2022   CL 102 06/09/2022   CREATININE 0.73 06/09/2022   BUN 15 06/09/2022   CO2 26 06/09/2022   TSH 1.43 08/19/2020   HGBA1C 5.7 08/19/2020    BP Readings from Last 3 Encounters:  06/09/22 110/80  01/30/21 111/73  08/19/20 106/76    Lab results reviewed with patient   ASSESSMENT AND PLAN:  Discussed the following assessment and plan:    ICD-10-CM   1. Visit for preventive health examination  Z00.00     2. Medication management  Z79.899      No follow-ups on file.  Patient Care Team: Burnis Medin, MD as PCP - General Berle Mull, MD (Family Medicine) Maisie Fus, MD (Inactive) (Obstetrics and Gynecology) Barbaraann Cao, OD as Referring Physician (Optometry) There are no Patient Instructions on file for this visit.  Standley Brooking. Zahava Quant M.D.

## 2022-07-26 ENCOUNTER — Ambulatory Visit (INDEPENDENT_AMBULATORY_CARE_PROVIDER_SITE_OTHER): Payer: BC Managed Care – PPO | Admitting: Internal Medicine

## 2022-07-26 ENCOUNTER — Encounter: Payer: Self-pay | Admitting: Internal Medicine

## 2022-07-26 VITALS — BP 98/68 | HR 84 | Temp 98.0°F | Ht 66.75 in | Wt 201.6 lb

## 2022-07-26 DIAGNOSIS — O99210 Obesity complicating pregnancy, unspecified trimester: Secondary | ICD-10-CM

## 2022-07-26 DIAGNOSIS — Z0000 Encounter for general adult medical examination without abnormal findings: Secondary | ICD-10-CM | POA: Diagnosis not present

## 2022-07-26 DIAGNOSIS — N951 Menopausal and female climacteric states: Secondary | ICD-10-CM

## 2022-07-26 DIAGNOSIS — E669 Obesity, unspecified: Secondary | ICD-10-CM

## 2022-07-26 DIAGNOSIS — Z79899 Other long term (current) drug therapy: Secondary | ICD-10-CM

## 2022-07-26 DIAGNOSIS — G43829 Menstrual migraine, not intractable, without status migrainosus: Secondary | ICD-10-CM

## 2022-07-26 DIAGNOSIS — Z7689 Persons encountering health services in other specified circumstances: Secondary | ICD-10-CM

## 2022-07-26 LAB — TSH: TSH: 2.25 u[IU]/mL (ref 0.35–5.50)

## 2022-07-26 LAB — LIPID PANEL
Cholesterol: 194 mg/dL (ref 0–200)
HDL: 64.1 mg/dL (ref >39.00)
LDL Cholesterol: 117 mg/dL — ABNORMAL HIGH (ref 0–99)
NonHDL: 129.87
Total CHOL/HDL Ratio: 3
Triglycerides: 65 mg/dL (ref 0.0–149.0)
VLDL: 13 mg/dL (ref 0.0–40.0)

## 2022-07-26 LAB — HEMOGLOBIN A1C: Hgb A1c MFr Bld: 5.8 % (ref 4.6–6.5)

## 2022-07-26 MED ORDER — PHENTERMINE-TOPIRAMATE ER 3.75-23 MG PO CP24: ORAL_CAPSULE | ORAL | 0 refills | Status: DC

## 2022-07-26 NOTE — Patient Instructions (Signed)
Good to see you today .   Lab today   Continue attention to diet and activity . Can try weight management meds .

## 2022-07-27 NOTE — Progress Notes (Signed)
No diabetes  cholesterol level stqable   favorable ratio  Thyroid normal range The 10-year ASCVD risk score (Arnett DK, et al., 2019) is: 1%   Values used to calculate the score:     Age: 57 years     Sex: Female     Is Non-Hispanic African American: No     Diabetic: No     Tobacco smoker: No     Systolic Blood Pressure: 98 mmHg     Is BP treated: No     HDL Cholesterol: 64.1 mg/dL     Total Cholesterol: 194 mg/dL

## 2022-07-29 DIAGNOSIS — F419 Anxiety disorder, unspecified: Secondary | ICD-10-CM | POA: Diagnosis not present

## 2022-08-05 DIAGNOSIS — F419 Anxiety disorder, unspecified: Secondary | ICD-10-CM | POA: Diagnosis not present

## 2022-08-11 ENCOUNTER — Other Ambulatory Visit: Payer: Self-pay | Admitting: Internal Medicine

## 2022-08-11 DIAGNOSIS — G43109 Migraine with aura, not intractable, without status migrainosus: Secondary | ICD-10-CM

## 2022-08-11 MED ORDER — SUMATRIPTAN SUCCINATE 100 MG PO TABS: ORAL_TABLET | ORAL | 0 refills | Status: DC

## 2022-08-12 DIAGNOSIS — F419 Anxiety disorder, unspecified: Secondary | ICD-10-CM | POA: Diagnosis not present

## 2022-08-17 ENCOUNTER — Ambulatory Visit (INDEPENDENT_AMBULATORY_CARE_PROVIDER_SITE_OTHER): Payer: BC Managed Care – PPO | Admitting: Family Medicine

## 2022-08-17 ENCOUNTER — Encounter: Payer: Self-pay | Admitting: Family Medicine

## 2022-08-17 VITALS — BP 110/80 | HR 65 | Temp 98.1°F | Wt 198.0 lb

## 2022-08-17 DIAGNOSIS — R42 Dizziness and giddiness: Secondary | ICD-10-CM | POA: Diagnosis not present

## 2022-08-17 NOTE — Progress Notes (Signed)
   Subjective:    Patient ID: Debra [name], female    DOB: [birth year], 57 y.o.   MRN: [id number]  HPI Here for the onset earlier this morning of generalized fatigue, lightheadedness, and slightly blurred vision. No headache or nausea. No sinus congestion. No other neurologic deficits. She has migraines, but she has never had these sensations before with a migraine. Usually these begin with pain on one side of the head or the other which then generalizes over the entire head. She takes Sumatriptan for these migraines and the usually give her prompt relief. She just finished a menstrual cycle, and she says the last 2 cycles have had much more bleeding than normal, and they have lasted longer than normal. She had more migraines headaches than usual this past weekend, and she had to take Sumatriptan on 3 different occasions. This is much more frequent than usual. She has not taken a Sumatriptan today. I asked about any new medications, and she says she was given phentermine-topiramate to try 3 weeks ago. She only took this 4 times however and then stopped. She had complete lab work done at her recent well exam, and everything was normal.    Review of Systems  Constitutional:  Positive for fatigue. Negative for fever.  HENT: Negative.    Eyes:  Positive for visual disturbance.  Respiratory: Negative.    Cardiovascular: Negative.   Gastrointestinal: Negative.   Genitourinary: Negative.   Neurological:  Positive for light-headedness. Negative for dizziness, tremors, seizures, syncope, facial asymmetry, speech difficulty, weakness, numbness and headaches.       Objective:   Physical Exam Constitutional:      Appearance: Normal appearance. She is not ill-appearing.  Cardiovascular:     Rate and Rhythm: Normal rate and regular rhythm.     Pulses: Normal pulses.     Heart sounds: Normal heart sounds.  Pulmonary:     Effort: Pulmonary effort is normal.     Breath sounds: Normal breath sounds.   Neurological:     General: No focal deficit present.     Mental Status: She is alert and oriented to person, place, and time.     Motor: No weakness.     Coordination: Coordination normal.     Gait: Gait normal.           Assessment & Plan:  She has been feeling fatigue and lightheadedness with some blurred vision today, but no headache. This could be from an atypical migraine. She could also be anemic from her recent heavy menses. We will get a CBC today. I advised her to go home and then take a Sumatriptan. She will report back to [place] in the morning on how she is feeling.  Alysia Penna, MD

## 2022-08-18 LAB — CBC WITH DIFFERENTIAL/PLATELET
Basophils Absolute: 0.1 10*3/uL (ref 0.0–0.1)
Basophils Relative: 1 % (ref 0.0–3.0)
Eosinophils Absolute: 0.3 10*3/uL (ref 0.0–0.7)
Eosinophils Relative: 3.9 % (ref 0.0–5.0)
HCT: 42 % (ref 36.0–46.0)
Hemoglobin: 14.3 g/dL (ref 12.0–15.0)
Lymphocytes Relative: 33.1 % (ref 12.0–46.0)
Lymphs Abs: 2.4 10*3/uL (ref 0.7–4.0)
MCHC: 34.1 g/dL (ref 30.0–36.0)
MCV: 94.2 fl (ref 78.0–100.0)
Monocytes Absolute: 0.5 10*3/uL (ref 0.1–1.0)
Monocytes Relative: 6.4 % (ref 3.0–12.0)
Neutro Abs: 4 10*3/uL (ref 1.4–7.7)
Neutrophils Relative %: 55.6 % (ref 43.0–77.0)
Platelets: 293 10*3/uL (ref 150.0–400.0)
RBC: 4.46 Mil/uL (ref 3.87–5.11)
RDW: 13 % (ref 11.5–15.5)
WBC: 7.2 10*3/uL (ref 4.0–10.5)

## 2022-08-18 NOTE — Telephone Encounter (Signed)
I am glad she is feeling better. I will defer to Dr. Regis Bill to do any referrals.

## 2022-08-19 DIAGNOSIS — F419 Anxiety disorder, unspecified: Secondary | ICD-10-CM | POA: Diagnosis not present

## 2022-08-29 ENCOUNTER — Encounter: Payer: Self-pay | Admitting: Internal Medicine

## 2022-08-29 DIAGNOSIS — G43109 Migraine with aura, not intractable, without status migrainosus: Secondary | ICD-10-CM

## 2022-08-29 DIAGNOSIS — G43829 Menstrual migraine, not intractable, without status migrainosus: Secondary | ICD-10-CM

## 2022-08-31 NOTE — Telephone Encounter (Signed)
[facility] could go to [facility]  with Dr Neale Burly  or  Dr Everlena Cooper with [facility] [place]  [facility] , but you may need a referral .

## 2022-09-02 DIAGNOSIS — F419 Anxiety disorder, unspecified: Secondary | ICD-10-CM | POA: Diagnosis not present

## 2022-09-05 ENCOUNTER — Other Ambulatory Visit: Payer: Self-pay | Admitting: Internal Medicine

## 2022-09-05 DIAGNOSIS — G43109 Migraine with aura, not intractable, without status migrainosus: Secondary | ICD-10-CM

## 2022-09-05 MED ORDER — SUMATRIPTAN SUCCINATE 100 MG PO TABS: ORAL_TABLET | ORAL | 0 refills | Status: DC

## 2022-09-09 ENCOUNTER — Encounter: Payer: Self-pay | Admitting: Neurology

## 2022-09-09 DIAGNOSIS — F419 Anxiety disorder, unspecified: Secondary | ICD-10-CM | POA: Diagnosis not present

## 2022-09-16 DIAGNOSIS — F419 Anxiety disorder, unspecified: Secondary | ICD-10-CM | POA: Diagnosis not present

## 2022-09-30 ENCOUNTER — Encounter: Payer: Self-pay | Admitting: Internal Medicine

## 2022-09-30 ENCOUNTER — Other Ambulatory Visit: Payer: Self-pay | Admitting: Internal Medicine

## 2022-09-30 DIAGNOSIS — M25552 Pain in left hip: Secondary | ICD-10-CM | POA: Diagnosis not present

## 2022-09-30 DIAGNOSIS — G43109 Migraine with aura, not intractable, without status migrainosus: Secondary | ICD-10-CM

## 2022-09-30 DIAGNOSIS — M545 Low back pain, unspecified: Secondary | ICD-10-CM | POA: Diagnosis not present

## 2022-09-30 MED ORDER — SUMATRIPTAN SUCCINATE 100 MG PO TABS: ORAL_TABLET | ORAL | 1 refills | Status: DC

## 2022-10-03 NOTE — Telephone Encounter (Signed)
Ok to refill x 3 

## 2022-10-03 NOTE — Progress Notes (Signed)
Normal dexa scan for age

## 2022-10-04 NOTE — Telephone Encounter (Signed)
Attempt to change a refill to 3 with pharmacy. Spoke to [place]. She states she is unable to change it in her system. Have to wait for next refill.

## 2022-10-07 DIAGNOSIS — F419 Anxiety disorder, unspecified: Secondary | ICD-10-CM | POA: Diagnosis not present

## 2022-10-14 DIAGNOSIS — F419 Anxiety disorder, unspecified: Secondary | ICD-10-CM | POA: Diagnosis not present

## 2022-10-19 ENCOUNTER — Ambulatory Visit: Payer: BC Managed Care – PPO | Admitting: Cardiology

## 2022-10-21 DIAGNOSIS — F419 Anxiety disorder, unspecified: Secondary | ICD-10-CM | POA: Diagnosis not present

## 2022-10-27 ENCOUNTER — Encounter: Payer: Self-pay | Admitting: Cardiology

## 2022-10-27 NOTE — Progress Notes (Signed)
Cardiology Office Note   Date:  10/28/2022   ID:  Debra [name], DOB Oct 05, 1965, MRN [id number]  PCP:  Madelin Headings, MD  Cardiologist:   None Referring:  Madelin Headings, MD  Chief Complaint  Patient presents with   Chest Pain      History of Present Illness: Debra [name] is a 57 y.o. female who presents for  who was seen by Dr. Bing Matter in the past for chest pain.  This was thought to be non anginal and he had a zero calcium score.  She had a negative stress test in 2012.  She is referred by Madelin Headings, MD for evaluation of chest pain.   He describes really having more arm left-sided tingling down into her hand.  This seems to happen somewhat sporadically.  She might have a little chest discomfort with this as well.  This is somewhat mild.  It goes away at rest.  She does not seem to get it other than with activity.  She was having some of this and her husband checked her rhythm on his device and they noted a PVC.  She also has episodes of a cough that comes on when she exercises.  She developed some coughing spells.  She is not really describing SOB.  She does not have PND or orthopnea.  She does not feel sustained tachypalpitations had no presyncope or syncope.  Past Medical History:  Diagnosis Date   Chronic headaches    ESOPHAGITIS, HX OF 02/18/2010   EXTERNAL HEMORRHOIDS 02/18/2010   Gallstones    GERD 03/13/2007   LIVER FUNCTION TESTS, ABNORMAL 01/06/2010   Ovarian cyst    PANCREATITIS, ACUTE 02/18/2010   Qualifier: Diagnosis of  By: Candice Camp CMA (AAMA), Dottie     PARESTHESIA 03/13/2007   Ruptured ovarian cyst    VARICOSE VEIN 01/06/2010    Past Surgical History:  Procedure Laterality Date   CHOLECYSTECTOMY     ENDOVENOUS ABLATION SAPHENOUS VEIN W/ LASER Left 07/02/2020   endovenous laser ablation left greater saphenous vein and stab phlebectomy 10-20 incisions left leg by Cari Caraway MD    TONSILLECTOMY     Uterine Polyps        Current Outpatient Medications  Medication Sig Dispense Refill   Cholecalciferol (VITAMIN D) 50 MCG (2000 UT) CAPS Vitamin D     ibuprofen (ADVIL,MOTRIN) 200 MG tablet Take 600 mg by mouth every 6 (six) hours as needed for moderate pain.     pantoprazole (PROTONIX) 40 MG tablet Take 1 tablet (40 mg total) by mouth daily. 90 tablet 1   SUMAtriptan (IMITREX) 100 MG tablet TAKE 1 TABLET BY MOUTH IMMEDIATELY MAY REPEAT AFTER 2 HOURS--Schedule yearly visit with labs for refills. [phone] 9 tablet 1   Current Facility-Administered Medications  Medication Dose Route Frequency Provider Last Rate Last Admin   0.9 %  sodium chloride infusion  500 mL Intravenous Once Charlie Pitter III, MD        Allergies:   Gadolinium, Penicillins, and Sulfonamide derivatives    Social History:  The patient  reports that she has never smoked. She has never used smokeless tobacco. She reports current alcohol use. She reports that she does not use drugs.   Family History:  The patient's family history includes Alcohol abuse in her father; Anemia in her father; Cardiomyopathy in her father; Colon cancer in her maternal grandmother; Diabetes in her mother; Lung cancer in her mother; Other in her brother and  father; Pancreatitis in her maternal grandfather.    ROS:  Please see the history of present illness.   Otherwise, review of systems are positive for none.   All other systems are reviewed and negative.    PHYSICAL EXAM: VS:  BP 120/78   Pulse 68   Ht 5\' 7"  (1.702 m)   Wt 202 lb 12.8 oz (92 kg)   SpO2 97%   BMI 31.76 kg/m  , BMI Body mass index is 31.76 kg/m. GENERAL:  Well appearing HEENT:  Pupils equal round and reactive, fundi not visualized, oral mucosa unremarkable NECK:  No jugular venous distention, waveform within normal limits, carotid upstroke brisk and symmetric, no bruits, no thyromegaly LYMPHATICS:  No cervical, inguinal adenopathy LUNGS:  Clear to auscultation bilaterally BACK:   No CVA tenderness CHEST:  Unremarkable HEART:  PMI not displaced or sustained,S1 and S2 within normal limits, no S3, no S4, no clicks, no rubs, no murmurs ABD:  Flat, positive bowel sounds normal in frequency in pitch, no bruits, no rebound, no guarding, no midline pulsatile mass, no hepatomegaly, no splenomegaly EXT:  2 plus pulses throughout, no edema, no cyanosis no clubbing SKIN:  No rashes no nodules NEURO:  Cranial nerves II through XII grossly intact, motor grossly intact throughout PSYCH:  Cognitively intact, oriented to person place and time    EKG:  EKG is ordered today. The ekg ordered today demonstrates sinus rhythm, rate 68, axis within normal limits, intervals within normal limits, no acute ST-T wave changes.   Recent Labs: 06/09/2022: ALT 13; BUN 15; Creatinine, Ser 0.73; Potassium 4.2; Sodium 137 07/26/2022: TSH 2.25 08/17/2022: Hemoglobin 14.3; Platelets 293.0    Lipid Panel    Component Value Date/Time   CHOL 194 07/26/2022 1158   CHOL 198 09/06/2019 1054   TRIG 65.0 07/26/2022 1158   HDL 64.10 07/26/2022 1158   HDL 64 09/06/2019 1054   CHOLHDL 3 07/26/2022 1158   VLDL 13.0 07/26/2022 1158   LDLCALC 117 (H) 07/26/2022 1158   LDLCALC 124 (H) 09/06/2019 1054      Wt Readings from Last 3 Encounters:  10/28/22 202 lb 12.8 oz (92 kg)  08/17/22 198 lb (89.8 kg)  07/26/22 201 lb 9.6 oz (91.4 kg)      Other studies Reviewed: Additional studies/ records that were reviewed today include: Labs. Review of the above records demonstrates:  Please see elsewhere in the note.     ASSESSMENT AND PLAN:  Chest discomfort: This is really more of an arm discomfort.  I think the pretest probability of obstructive coronary disease is low.  However, it is reasonable to screen her with POET (Plain Old Exercise Treadmill).  This also might reproduce the coughing that she is getting when she exercises.  If this is normal I would not suggest further testing.  She might want to  consider an exercise-induced asthma.  Dyslipidemia: Her LDL previously was 117 with an HDL of 64.  That was earlier this year.  I would suggest maybe a calcium score to happen in 5 years after the original.  That has not yet.  That would help determine goals of therapy.  We talked about a plant forward diet.   Current medicines are reviewed at length with the patient today.  The patient does not have concerns regarding medicines.  The following changes have been made:  no change  Labs/ tests ordered today include:   Orders Placed This Encounter  Procedures   Exercise Tolerance Test  EKG 12-Lead     Disposition:   FU with me as needed.      Signed, Rollene Rotunda, MD  10/28/2022 11:30 AM    [facility]

## 2022-10-28 ENCOUNTER — Ambulatory Visit: Payer: BC Managed Care – PPO | Attending: Cardiology | Admitting: Cardiology

## 2022-10-28 ENCOUNTER — Encounter: Payer: Self-pay | Admitting: Cardiology

## 2022-10-28 VITALS — BP 120/78 | HR 68 | Ht 67.0 in | Wt 202.8 lb

## 2022-10-28 DIAGNOSIS — R072 Precordial pain: Secondary | ICD-10-CM

## 2022-10-28 DIAGNOSIS — F419 Anxiety disorder, unspecified: Secondary | ICD-10-CM | POA: Diagnosis not present

## 2022-10-28 NOTE — Patient Instructions (Signed)
  Testing/Procedures:   Your physician has requested that you have an exercise tolerance test. For further information please visit [email or web address]. Please also follow instruction sheet, as given. [address]   Follow-Up: At [facility], you and your health needs are our priority.  As part of our continuing mission to provide you with exceptional heart care, we have created designated Provider Care Teams.  These Care Teams include your primary Cardiologist (physician) and Advanced Practice Providers (APPs -  Physician Assistants and Nurse Practitioners) who all work together to provide you with the care you need, when you need it.  We recommend signing up for the patient portal called "MyChart".  Sign up information is provided on this After Visit Summary.  MyChart is used to connect with patients for Virtual Visits (Telemedicine).  Patients are able to view lab/test results, encounter notes, upcoming appointments, etc.  Non-urgent messages can be sent to your provider as well.   To learn more about what you can do with MyChart, go to [email or web address].    Your next appointment:    AS NEEDED   

## 2022-11-04 DIAGNOSIS — F419 Anxiety disorder, unspecified: Secondary | ICD-10-CM | POA: Diagnosis not present

## 2022-11-22 ENCOUNTER — Ambulatory Visit: Payer: BC Managed Care – PPO | Attending: Interventional Cardiology

## 2022-11-22 ENCOUNTER — Encounter: Payer: Self-pay | Admitting: Internal Medicine

## 2022-11-22 DIAGNOSIS — R072 Precordial pain: Secondary | ICD-10-CM

## 2022-11-22 DIAGNOSIS — G43109 Migraine with aura, not intractable, without status migrainosus: Secondary | ICD-10-CM

## 2022-11-22 LAB — EXERCISE TOLERANCE TEST
Angina Index: 0
Duke Treadmill Score: 7
Estimated workload: 8.6
Exercise duration (min): 7 min
Exercise duration (sec): 0 s
MPHR: 164 {beats}/min
Peak HR: 148 {beats}/min
Percent HR: 90 %
RPE: 17
Rest HR: 75 {beats}/min
ST Depression (mm): 0 mm

## 2022-11-23 MED ORDER — SUMATRIPTAN SUCCINATE 100 MG PO TABS: ORAL_TABLET | ORAL | 0 refills | Status: DC

## 2022-12-02 DIAGNOSIS — F419 Anxiety disorder, unspecified: Secondary | ICD-10-CM | POA: Diagnosis not present

## 2022-12-02 DIAGNOSIS — N951 Menopausal and female climacteric states: Secondary | ICD-10-CM | POA: Diagnosis not present

## 2022-12-09 ENCOUNTER — Encounter (HOSPITAL_COMMUNITY): Payer: BC Managed Care – PPO

## 2022-12-09 DIAGNOSIS — F419 Anxiety disorder, unspecified: Secondary | ICD-10-CM | POA: Diagnosis not present

## 2022-12-23 DIAGNOSIS — F419 Anxiety disorder, unspecified: Secondary | ICD-10-CM | POA: Diagnosis not present

## 2022-12-27 ENCOUNTER — Encounter: Payer: Self-pay | Admitting: Internal Medicine

## 2022-12-27 DIAGNOSIS — G43109 Migraine with aura, not intractable, without status migrainosus: Secondary | ICD-10-CM

## 2022-12-28 ENCOUNTER — Ambulatory Visit: Payer: BC Managed Care – PPO | Admitting: Internal Medicine

## 2022-12-28 MED ORDER — SUMATRIPTAN SUCCINATE 100 MG PO TABS: ORAL_TABLET | ORAL | 0 refills | Status: DC

## 2022-12-29 ENCOUNTER — Encounter: Payer: Self-pay | Admitting: Internal Medicine

## 2023-01-06 DIAGNOSIS — F419 Anxiety disorder, unspecified: Secondary | ICD-10-CM | POA: Diagnosis not present

## 2023-01-10 ENCOUNTER — Ambulatory Visit: Payer: BC Managed Care – PPO | Admitting: Internal Medicine

## 2023-01-13 DIAGNOSIS — F419 Anxiety disorder, unspecified: Secondary | ICD-10-CM | POA: Diagnosis not present

## 2023-01-17 ENCOUNTER — Encounter: Payer: Self-pay | Admitting: Internal Medicine

## 2023-01-17 ENCOUNTER — Ambulatory Visit: Payer: BC Managed Care – PPO | Admitting: Internal Medicine

## 2023-01-17 VITALS — BP 120/80 | HR 78 | Temp 98.1°F | Ht 67.0 in | Wt 203.2 lb

## 2023-01-17 DIAGNOSIS — Z79899 Other long term (current) drug therapy: Secondary | ICD-10-CM

## 2023-01-17 DIAGNOSIS — Z6831 Body mass index (BMI) 31.0-31.9, adult: Secondary | ICD-10-CM

## 2023-01-17 DIAGNOSIS — E669 Obesity, unspecified: Secondary | ICD-10-CM

## 2023-01-17 DIAGNOSIS — G43109 Migraine with aura, not intractable, without status migrainosus: Secondary | ICD-10-CM

## 2023-01-17 MED ORDER — SUMATRIPTAN SUCCINATE 100 MG PO TABS
ORAL_TABLET | ORAL | 1 refills | Status: DC
Start: 2023-01-17 — End: 2023-06-05

## 2023-01-17 NOTE — Progress Notes (Signed)
Chief Complaint  Patient presents with   Medical Management of Chronic Issues    Follow up on htn and wt. Pt reports Qsymia gives her headache. She stopped it in March.    Headache    Pt reports headache and has appt with neurologist in Sept. Was seen with GYN and given estradiol patch and progesterone to take. Helps with sx.     HPI: Debra [name] 57 y.o. come in for fu number of issues  Mostly headaches that have accelerated in past months  for no obv reasonsa alathgouh perimenopausal.   Micah Flesher to see dr Eustaquio Boyden .  ? Cause of increase in migraine  very frequent  more than once a week every other day   Has neuro  consult in fall .  Hormones for 6 weeks.   Patch and progesterone  And seems to help a lot.  With fewer  HAs  still needs refill   imitrex  Discouraged on weight  gainting Working on weight .  Loss  hx of weight watcher in remote past and lost weight  but recently creeping up.  In person meeting are no longer available  other programs interfere with work and  child care.  No osa  ROS: See pertinent positives and negatives per HPI. No current  sig cp gi other nuero issues . L bp also  no rdiation up and arounda ll day at work  Past Medical History:  Diagnosis Date   Chronic headaches    ESOPHAGITIS, HX OF 02/18/2010   EXTERNAL HEMORRHOIDS 02/18/2010   Gallstones    GERD 03/13/2007   LIVER FUNCTION TESTS, ABNORMAL 01/06/2010   Ovarian cyst    PANCREATITIS, ACUTE 02/18/2010   Qualifier: Diagnosis of  By: Candice Camp CMA (AAMA), Dottie     PARESTHESIA 03/13/2007   Ruptured ovarian cyst    VARICOSE VEIN 01/06/2010    Family History  Problem Relation Age of Onset   Lung cancer Mother    Diabetes Mother    Alcohol abuse Father    Cardiomyopathy Father    Anemia Father    Other Father        Konrad Felix. Heart Beat   Other Brother        Irr. Heart Beat   Colon cancer Maternal Grandmother    Pancreatitis Maternal Grandfather    Esophageal cancer Neg Hx    Rectal  cancer Neg Hx    Stomach cancer Neg Hx     Social History   Socioeconomic History   Marital status: Married    Spouse name: Not on file   Number of children: 0   Years of education: Not on file   Highest education level: Bachelor's degree (e.g., BA, AB, BS)  Occupational History   Occupation: PHARMACIST    Employer: [employer]  Tobacco Use   Smoking status: Never   Smokeless tobacco: Never  Vaping Use   Vaping status: Never Used  Substance and Sexual Activity   Alcohol use: Yes    Comment: rarely   Drug use: Never   Sexual activity: Not on file  Other Topics Concern   Not on file  Social History Narrative   Dogs 2 married is a Teacher, early years/pre                   Social Determinants of Health   Financial Resource Strain: Low Risk  (08/17/2022)   Overall Financial Resource Strain (CARDIA)    Difficulty of Paying Living Expenses: Not very  hard  Food Insecurity: No Food Insecurity (08/17/2022)   Hunger Vital Sign    Worried About Running Out of Food in the Last Year: Never true    Ran Out of Food in the Last Year: Never true  Transportation Needs: No Transportation Needs (08/17/2022)   PRAPARE - Administrator, Civil Service (Medical): No    Lack of Transportation (Non-Medical): No  Physical Activity: Insufficiently Active (08/17/2022)   Exercise Vital Sign    Days of Exercise per Week: 4 days    Minutes of Exercise per Session: 30 min  Stress: No Stress Concern Present (08/17/2022)   [employer] of Occupational Health - Occupational Stress Questionnaire    Feeling of Stress : Not at all  Social Connections: Socially Integrated (08/17/2022)   Social Connection and Isolation Panel [NHANES]    Frequency of Communication with Friends and Family: Three times a week    Frequency of Social Gatherings with Friends and Family: Once a week    Attends Religious Services: More than 4 times per year    Active Member of [employer] or Organizations: Yes    Attends Museum/gallery exhibitions officer: More than 4 times per year    Marital Status: Married    Outpatient Medications Prior to Visit  Medication Sig Dispense Refill   Cholecalciferol (VITAMIN D) 50 MCG (2000 UT) CAPS Vitamin D     estradiol (VIVELLE-DOT) 0.0375 MG/24HR APPLY 1 PATCH TOPICALLY TO THE SKIN 2 TIMES A WEEK     ibuprofen (ADVIL,MOTRIN) 200 MG tablet Take 600 mg by mouth every 6 (six) hours as needed for moderate pain.     progesterone (PROMETRIUM) 100 MG capsule Take 100 mg by mouth daily.     SUMAtriptan (IMITREX) 100 MG tablet TAKE 1 TABLET BY MOUTH IMMEDIATELY MAY REPEAT AFTER 2 HOURS--Schedule yearly visit with labs for refills. [phone] 9 tablet 0   pantoprazole (PROTONIX) 40 MG tablet Take 1 tablet (40 mg total) by mouth daily. (Patient not taking: Reported on 01/17/2023) 90 tablet 1   Facility-Administered Medications Prior to Visit  Medication Dose Route Frequency Provider Last Rate Last Admin   0.9 %  sodium chloride infusion  500 mL Intravenous Once Danis, Starr [place] III, MD         EXAM:  BP 120/80 (BP Location: Left Arm, Patient Position: Sitting, Cuff Size: Large)   Pulse 78   Temp 98.1 F (36.7 C) (Oral)   Ht 5\' 7"  (1.702 m)   Wt 203 lb 3.2 oz (92.2 kg)   LMP 09/03/2022 (Approximate)   SpO2 97%   BMI 31.83 kg/m   Body mass index is 31.83 kg/m.  GENERAL: vitals reviewed and listed above, alert, oriented, appears well hydrated and in no acute distress HEENT: atraumatic, conjunctiva  clear, no obvious abnormalities on inspection of external nose and ears NECK: no obvious masses on inspection palpation  LUNGS: clear to auscultation bilaterally, no wheezes, rales or rhonchi, good air movement CV: HRRR, no clubbing cyanosis or  peripheral edema nl cap refill  Abdomen:  Sof,t normal bowel sounds without hepatosplenomegaly, no guarding rebound or masses no CVA tenderness MS: moves all extremities without noticeable focal  abnormality PSYCH: pleasant and cooperative, no  obvious depression or anxiety Neuro grossly non focal  Lab Results  Component Value Date   WBC 7.2 08/17/2022   HGB 14.3 08/17/2022   HCT 42.0 08/17/2022   PLT 293.0 08/17/2022   GLUCOSE 95 06/09/2022   CHOL 194 07/26/2022  TRIG 65.0 07/26/2022   HDL 64.10 07/26/2022   LDLCALC 117 (H) 07/26/2022   ALT 13 06/09/2022   AST 20 06/09/2022   NA 137 06/09/2022   K 4.2 06/09/2022   CL 102 06/09/2022   CREATININE 0.73 06/09/2022   BUN 15 06/09/2022   CO2 26 06/09/2022   TSH 2.25 07/26/2022   HGBA1C 5.8 07/26/2022   BP Readings from Last 3 Encounters:  01/17/23 120/80  10/28/22 120/78  08/17/22 110/80   Prev lab review  ASSESSMENT AND PLAN:  Discussed the following assessment and plan:  Migraine with aura and without status migrainosus, not intractable - Plan: SUMAtriptan (IMITREX) 100 MG tablet  Obesity (BMI 30-39.9)  Medication management Agree with neuro  eval . Calendar ? Indication for suppressive therapy  Seems hormonal triggers. Disc weight management and in past phentermine  had se and others   disc  glp1s she is not diabetic at this this point get back into a monitoring program of some soiurt  get back with me in 3 mos and see any progress or status  Counseling and disc about bmi weight management and plans   and migraine meds and monitoring  next steps    32 minutes   -Patient advised to return or notify health care team  if  new concerns arise.  Patient Instructions  Good to see you today . Agree with fu with neurology  Consideration of  suppressive meds.  Glad the hormonal rx may be helping suppress  the migraine. Consider maintenance back exercise .  Consider tracking  methods if can't do weight watchers.  Can get back  with [place] in about 3 mos  message about how doing.       Neta Mends. Nikan Ellingson M.D.

## 2023-01-17 NOTE — Patient Instructions (Addendum)
Good to see you today . Agree with fu with neurology  Consideration of  suppressive meds.  Glad the hormonal rx may be helping suppress  the migraine. Consider maintenance back exercise .  Consider tracking  methods if can't do weight watchers.  Can get back  with [place] in about 3 mos  message about how doing.

## 2023-01-20 DIAGNOSIS — F419 Anxiety disorder, unspecified: Secondary | ICD-10-CM | POA: Diagnosis not present

## 2023-01-20 DIAGNOSIS — N951 Menopausal and female climacteric states: Secondary | ICD-10-CM | POA: Diagnosis not present

## 2023-01-27 ENCOUNTER — Ambulatory Visit: Payer: BC Managed Care – PPO | Admitting: Neurology

## 2023-02-03 DIAGNOSIS — F419 Anxiety disorder, unspecified: Secondary | ICD-10-CM | POA: Diagnosis not present

## 2023-02-08 NOTE — Progress Notes (Signed)
NEUROLOGY CONSULTATION NOTE  SPIRIT GOODIE MRN: [id number] DOB: 1966/05/05  Referring provider: Berniece Andreas, MD Primary care provider: Berniece Andreas, MD  Reason for consult:  migraines  Assessment/Plan:   Menstrual migraines, without status migrainosus, not intractable  Will try perimenstrual prophylaxis.  If she feels that she is going to start her period, she will take naratriptan 1mg  twice daily for 6 days.  She is instructed not to take sumatriptan during this time. Follow up 6 months.   Subjective:  Debra [name] is a 57 year old female who presents for migraines.  History supplemented by referring provider's note.  History of migraines since high school.  They were menstrual, occurring 2 to 3 times a month usually week of menses.  Earlier this year, they started becoming more frequent, occurring every other day.  Responds to sumatriptan, lasting about an hour.  She started experiencing lightheadedness as well.  Headaches are a non-throbbing unilateral (either side) pain.  Associated with nausea, vomiting, photophobia and phonophobia.  She eventually started experiencing hot flashes and was found to be perimenopausal.  She was started on estradiol and progesterone and migraines ceased.  She didn't have a period for 3 months.  Then this month she had a period and had frequent migraines for that entire week.  She will often know the day before that she is going to have her period.  Prior workup for headaches includes CT head on 03/01/2013 which was personally reviewed and was unremarkable.  Past NSAIDS/analgesics:  Fioricet, tramadol Past abortive triptans:  none Past abortive ergotamine:  none Past muscle relaxants:  Flexeril Past anti-emetic:  Zofran, Phenergan Past antihypertensive medications:  none Past antidepressant medications:  none Past anticonvulsant medications:  topiramate Past anti-CGRP:  none Past vitamins/Herbal/Supplements:  none Past  antihistamines/decongestants:  Flonase Other past therapies:  none  Current NSAIDS/analgesics:  ibuprofen Current triptans:  sumatriptan 100mg  tab Current ergotamine:  none Current anti-emetic:  none Current muscle relaxants:  none Current Antihypertensive medications:  none Current Antidepressant medications:  none Current Anticonvulsant medications:  none Current anti-CGRP:  none Current Vitamins/Herbal/Supplements:  D Current Antihistamines/Decongestants:  none Other therapy:  none Hormone:  estradiol, progesterone   Caffeine:  1 to 2 iced latte Diet:  Zollie Beckers, seltzer, no soda.  Does not skip meals Exercise:  walks 4 days a week, gym 2 days a week Depression:  no; Anxiety:  no Sleep hygiene:  good Family history of headache:  mom, maternal aunt, brother      PAST MEDICAL HISTORY: Past Medical History:  Diagnosis Date   Chronic headaches    ESOPHAGITIS, HX OF 02/18/2010   EXTERNAL HEMORRHOIDS 02/18/2010   Gallstones    GERD 03/13/2007   LIVER FUNCTION TESTS, ABNORMAL 01/06/2010   Ovarian cyst    PANCREATITIS, ACUTE 02/18/2010   Qualifier: Diagnosis of  By: Candice Camp CMA (AAMA), Dottie     PARESTHESIA 03/13/2007   Ruptured ovarian cyst    VARICOSE VEIN 01/06/2010    PAST SURGICAL HISTORY: Past Surgical History:  Procedure Laterality Date   CHOLECYSTECTOMY     ENDOVENOUS ABLATION SAPHENOUS VEIN W/ LASER Left 07/02/2020   endovenous laser ablation left greater saphenous vein and stab phlebectomy 10-20 incisions left leg by Cari Caraway MD    TONSILLECTOMY     Uterine Polyps      MEDICATIONS: Current Outpatient Medications on File Prior to Visit  Medication Sig Dispense Refill   Cholecalciferol (VITAMIN D) 50 MCG (2000 UT) CAPS Vitamin D  estradiol (VIVELLE-DOT) 0.0375 MG/24HR APPLY 1 PATCH TOPICALLY TO THE SKIN 2 TIMES A WEEK     ibuprofen (ADVIL,MOTRIN) 200 MG tablet Take 600 mg by mouth every 6 (six) hours as needed for moderate pain.      pantoprazole (PROTONIX) 40 MG tablet Take 1 tablet (40 mg total) by mouth daily. (Patient not taking: Reported on 01/17/2023) 90 tablet 1   progesterone (PROMETRIUM) 100 MG capsule Take 100 mg by mouth daily.     SUMAtriptan (IMITREX) 100 MG tablet TAKE 1 TABLET BY MOUTH IMMEDIATELY MAY REPEAT AFTER 2 HOURS-- 9 tablet 1   Current Facility-Administered Medications on File Prior to Visit  Medication Dose Route Frequency Provider Last Rate Last Admin   0.9 %  sodium chloride infusion  500 mL Intravenous Once Danis, Andreas Blower, MD        ALLERGIES: Allergies  Allergen Reactions   Gadolinium      Code: HIVES, Desc: Multihance--pt had hives, Onset Date: [id number] IVP Dye   Penicillins     REACTION: rash   Sulfonamide Derivatives     REACTION: rash    FAMILY HISTORY: Family History  Problem Relation Age of Onset   Lung cancer Mother    Diabetes Mother    Alcohol abuse Father    Cardiomyopathy Father    Anemia Father    Other Father        Konrad Felix. Heart Beat   Other Brother        Irr. Heart Beat   Colon cancer Maternal Grandmother    Pancreatitis Maternal Grandfather    Esophageal cancer Neg Hx    Rectal cancer Neg Hx    Stomach cancer Neg Hx     Objective:  Blood pressure 117/68, pulse 75, height 5\' 7"  (1.702 m), weight 203 lb (92.1 kg), last menstrual period 09/03/2022, SpO2 98%. General: No acute distress.  Patient appears well-groomed.   Head:  Normocephalic/atraumatic Eyes:  fundi examined but not visualized Neck: supple, no paraspinal tenderness, full range of motion Heart: regular rate and rhythm Neurological Exam: Mental status: alert and oriented to person, place, and time, speech fluent and not dysarthric, language intact. Cranial nerves: CN I: not tested CN II: pupils equal, round and reactive to light, visual fields intact CN III, IV, VI:  full range of motion, no nystagmus, no ptosis CN V: facial sensation intact. CN VII: upper and lower face symmetric CN  VIII: hearing intact CN IX, X: gag intact, uvula midline CN XI: sternocleidomastoid and trapezius muscles intact CN XII: tongue midline Bulk & Tone: normal, no fasciculations. Motor:  muscle strength 5/5 throughout Sensation:  Temperature and vibratory sensation intact. Deep Tendon Reflexes:  2+ throughout,  toes downgoing.   Finger to nose testing:  Without dysmetria.    Gait:  Normal station and stride.  Romberg negative.    Thank you for allowing me to take part in the care of this patient.  Shon Millet, DO  CC: Berniece Andreas, MD

## 2023-02-10 ENCOUNTER — Encounter: Payer: Self-pay | Admitting: Neurology

## 2023-02-10 ENCOUNTER — Ambulatory Visit: Payer: BC Managed Care – PPO | Admitting: Neurology

## 2023-02-10 VITALS — BP 117/68 | HR 75 | Ht 67.0 in | Wt 203.0 lb

## 2023-02-10 DIAGNOSIS — G43829 Menstrual migraine, not intractable, without status migrainosus: Secondary | ICD-10-CM | POA: Diagnosis not present

## 2023-02-10 DIAGNOSIS — F419 Anxiety disorder, unspecified: Secondary | ICD-10-CM | POA: Diagnosis not present

## 2023-02-10 MED ORDER — NARATRIPTAN HCL 1 MG PO TABS: ORAL_TABLET | ORAL | 5 refills | Status: DC

## 2023-02-10 NOTE — Patient Instructions (Signed)
Take naratriptan 1mg  as directed:  Take 1 tablet twice daily starting first day you suspect you are going to start your period and take daily for 6 days.  Do not take with sumatriptan. Follow up in 6 months.

## 2023-02-24 DIAGNOSIS — F419 Anxiety disorder, unspecified: Secondary | ICD-10-CM | POA: Diagnosis not present

## 2023-02-27 DIAGNOSIS — Z23 Encounter for immunization: Secondary | ICD-10-CM | POA: Diagnosis not present

## 2023-03-03 DIAGNOSIS — F419 Anxiety disorder, unspecified: Secondary | ICD-10-CM | POA: Diagnosis not present

## 2023-03-10 DIAGNOSIS — F419 Anxiety disorder, unspecified: Secondary | ICD-10-CM | POA: Diagnosis not present

## 2023-03-17 DIAGNOSIS — F419 Anxiety disorder, unspecified: Secondary | ICD-10-CM | POA: Diagnosis not present

## 2023-03-20 DIAGNOSIS — Z23 Encounter for immunization: Secondary | ICD-10-CM | POA: Diagnosis not present

## 2023-03-31 DIAGNOSIS — F419 Anxiety disorder, unspecified: Secondary | ICD-10-CM | POA: Diagnosis not present

## 2023-04-07 DIAGNOSIS — F419 Anxiety disorder, unspecified: Secondary | ICD-10-CM | POA: Diagnosis not present

## 2023-04-14 DIAGNOSIS — F419 Anxiety disorder, unspecified: Secondary | ICD-10-CM | POA: Diagnosis not present

## 2023-04-28 DIAGNOSIS — Z6831 Body mass index (BMI) 31.0-31.9, adult: Secondary | ICD-10-CM | POA: Diagnosis not present

## 2023-04-28 DIAGNOSIS — Z01419 Encounter for gynecological examination (general) (routine) without abnormal findings: Secondary | ICD-10-CM | POA: Diagnosis not present

## 2023-04-28 DIAGNOSIS — F419 Anxiety disorder, unspecified: Secondary | ICD-10-CM | POA: Diagnosis not present

## 2023-05-05 DIAGNOSIS — F419 Anxiety disorder, unspecified: Secondary | ICD-10-CM | POA: Diagnosis not present

## 2023-05-12 DIAGNOSIS — F419 Anxiety disorder, unspecified: Secondary | ICD-10-CM | POA: Diagnosis not present

## 2023-05-26 DIAGNOSIS — F419 Anxiety disorder, unspecified: Secondary | ICD-10-CM | POA: Diagnosis not present

## 2023-06-02 DIAGNOSIS — F419 Anxiety disorder, unspecified: Secondary | ICD-10-CM | POA: Diagnosis not present

## 2023-06-04 ENCOUNTER — Encounter: Payer: Self-pay | Admitting: Internal Medicine

## 2023-06-05 ENCOUNTER — Other Ambulatory Visit: Payer: Self-pay | Admitting: Family

## 2023-06-05 DIAGNOSIS — G43109 Migraine with aura, not intractable, without status migrainosus: Secondary | ICD-10-CM

## 2023-06-05 MED ORDER — SUMATRIPTAN SUCCINATE 100 MG PO TABS
ORAL_TABLET | ORAL | 1 refills | Status: DC
Start: 2023-06-05 — End: 2023-10-09

## 2023-06-09 DIAGNOSIS — F419 Anxiety disorder, unspecified: Secondary | ICD-10-CM | POA: Diagnosis not present

## 2023-06-16 ENCOUNTER — Ambulatory Visit (INDEPENDENT_AMBULATORY_CARE_PROVIDER_SITE_OTHER): Payer: 59 | Admitting: Adult Health

## 2023-06-16 ENCOUNTER — Ambulatory Visit (INDEPENDENT_AMBULATORY_CARE_PROVIDER_SITE_OTHER): Payer: 59

## 2023-06-16 VITALS — BP 122/80 | HR 66 | Temp 97.9°F | Ht 67.0 in | Wt 197.0 lb

## 2023-06-16 DIAGNOSIS — J40 Bronchitis, not specified as acute or chronic: Secondary | ICD-10-CM

## 2023-06-16 DIAGNOSIS — F419 Anxiety disorder, unspecified: Secondary | ICD-10-CM | POA: Diagnosis not present

## 2023-06-16 DIAGNOSIS — R059 Cough, unspecified: Secondary | ICD-10-CM | POA: Diagnosis not present

## 2023-06-16 MED ORDER — PREDNISONE 10 MG PO TABS: ORAL_TABLET | ORAL | 0 refills | Status: DC

## 2023-06-16 MED ORDER — ALBUTEROL SULFATE HFA 108 (90 BASE) MCG/ACT IN AERS: 2.0000 | INHALATION_SPRAY | Freq: Four times a day (QID) | RESPIRATORY_TRACT | 0 refills | Status: DC | PRN

## 2023-06-16 NOTE — Progress Notes (Signed)
Subjective:    Patient ID: Debra [name], female    DOB: Jun 14, 1965, 58 y.o.   MRN: [id number]  Cough    58 year old female who  has a past medical history of Chronic headaches, ESOPHAGITIS, HX OF (02/18/2010), EXTERNAL HEMORRHOIDS (02/18/2010), Gallstones, GERD (03/13/2007), LIVER FUNCTION TESTS, ABNORMAL (01/06/2010), Ovarian cyst, PANCREATITIS, ACUTE (02/18/2010), PARESTHESIA (03/13/2007), Ruptured ovarian cyst, and VARICOSE VEIN (01/06/2010).  She presents to the office today for the complaint of a dry cough x 2 months with associated chest congestion intermittently.   She has not had a fever, chills, shortness of breath or wheezing.   Her symptoms seem to be worse at night.   She has been using Nyquil cough at night without much relief.    Review of Systems  Respiratory:  Positive for cough.    See HPI   Past Medical History:  Diagnosis Date   Chronic headaches    ESOPHAGITIS, HX OF 02/18/2010   EXTERNAL HEMORRHOIDS 02/18/2010   Gallstones    GERD 03/13/2007   LIVER FUNCTION TESTS, ABNORMAL 01/06/2010   Ovarian cyst    PANCREATITIS, ACUTE 02/18/2010   Qualifier: Diagnosis of  By: Candice Camp CMA (AAMA), Dottie     PARESTHESIA 03/13/2007   Ruptured ovarian cyst    VARICOSE VEIN 01/06/2010    Social History   Socioeconomic History   Marital status: Married    Spouse name: Not on file   Number of children: 0   Years of education: Not on file   Highest education level: Bachelor's degree (e.g., BA, AB, BS)  Occupational History   Occupation: PHARMACIST    Employer: [employer]  Tobacco Use   Smoking status: Never   Smokeless tobacco: Never  Vaping Use   Vaping status: Never Used  Substance and Sexual Activity   Alcohol use: Yes    Comment: rarely   Drug use: Never   Sexual activity: Not on file  Other Topics Concern   Not on file  Social History Narrative   Dogs 2 married is a Teacher, early years/pre    Are you right handed or left handed? Right   Are you  currently employed ? 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 N   Who lives with you?  Husband, Daughter, Brother   What type of home do you live in: 1 story or 2 story? 1                   Social Drivers of Corporate investment banker Strain: Low Risk  (06/16/2023)   Overall Financial Resource Strain (CARDIA)    Difficulty of Paying Living Expenses: Not very hard  Food Insecurity: No Food Insecurity (06/16/2023)   Hunger Vital Sign    Worried About Running Out of Food in the Last Year: Never true    Ran Out of Food in the Last Year: Never true  Transportation Needs: No Transportation Needs (06/16/2023)   PRAPARE - Administrator, Civil Service (Medical): No    Lack of Transportation (Non-Medical): No  Physical Activity: Sufficiently Active (06/16/2023)   Exercise Vital Sign    Days of Exercise per Week: 4 days    Minutes of Exercise per Session: 50 min  Stress: No Stress Concern Present (08/17/2022)   [employer] of Occupational Health - Occupational Stress Questionnaire    Feeling of Stress : Not at all  Social Connections: Socially Integrated (06/16/2023)  Social Connection and Isolation Panel [NHANES]    Frequency of Communication with Friends and Family: Three times a week    Frequency of Social Gatherings with Friends and Family: Once a week    Attends Religious Services: More than 4 times per year    Active Member of [employer] or Organizations: Yes    Attends Engineer, structural: More than 4 times per year    Marital Status: Married  Catering manager Violence: Not on file    Past Surgical History:  Procedure Laterality Date   CHOLECYSTECTOMY     ENDOVENOUS ABLATION SAPHENOUS VEIN W/ LASER Left 07/02/2020   endovenous laser ablation left greater saphenous vein and stab phlebectomy 10-20 incisions left leg by Cari Caraway MD    TONSILLECTOMY     Uterine Polyps      Family History  Problem Relation Age of  Onset   Stroke Mother    Migraines Mother    Lung cancer Mother    Diabetes Mother    Neuropathy Father    Alcohol abuse Father    Cardiomyopathy Father    Anemia Father    Other Father        Konrad Felix. Heart Beat   Migraines Brother    Other Brother        Irr. Heart Beat   Migraines Maternal Aunt    Colon cancer Maternal Grandmother    Pancreatitis Maternal Grandfather    Esophageal cancer Neg Hx    Rectal cancer Neg Hx    Stomach cancer Neg Hx     Allergies  Allergen Reactions   Gadolinium      Code: HIVES, Desc: Multihance--pt had hives, Onset Date: [id number] IVP Dye   Penicillins     REACTION: rash   Sulfonamide Derivatives     REACTION: rash    Current Outpatient Medications on File Prior to Visit  Medication Sig Dispense Refill   Cholecalciferol (VITAMIN D) 50 MCG (2000 UT) CAPS Vitamin D     estradiol (VIVELLE-DOT) 0.0375 MG/24HR APPLY 1 PATCH TOPICALLY TO THE SKIN 2 TIMES A WEEK     ibuprofen (ADVIL,MOTRIN) 200 MG tablet Take 600 mg by mouth every 6 (six) hours as needed for moderate pain.     pantoprazole (PROTONIX) 40 MG tablet Take 1 tablet (40 mg total) by mouth daily. 90 tablet 1   progesterone (PROMETRIUM) 100 MG capsule Take 100 mg by mouth daily.     SUMAtriptan (IMITREX) 100 MG tablet TAKE 1 TABLET BY MOUTH IMMEDIATELY MAY REPEAT AFTER 2 HOURS-- 9 tablet 1   Current Facility-Administered Medications on File Prior to Visit  Medication Dose Route Frequency Provider Last Rate Last Admin   0.9 %  sodium chloride infusion  500 mL Intravenous Once Danis, Starr Lake III, MD        BP 122/80   Pulse 66   Temp 97.9 F (36.6 C) (Oral)   Ht 5\' 7"  (1.702 m)   Wt 197 lb (89.4 kg)   SpO2 97%   BMI 30.85 kg/m       Objective:   Physical Exam Vitals and nursing note reviewed.  Constitutional:      Appearance: Normal appearance.  HENT:     Right Ear: Tympanic membrane, ear canal and external ear normal.     Left Ear: Tympanic membrane, ear canal and external  ear normal.     Nose: Nose normal. No congestion or rhinorrhea.  Cardiovascular:     Rate and Rhythm: Normal rate  and regular rhythm.     Pulses: Normal pulses.     Heart sounds: Normal heart sounds.  Pulmonary:     Effort: Pulmonary effort is normal.     Breath sounds: Examination of the right-upper field reveals wheezing. Examination of the left-upper field reveals wheezing. Examination of the right-middle field reveals wheezing. Examination of the left-middle field reveals wheezing. Examination of the right-lower field reveals wheezing. Examination of the left-lower field reveals wheezing. Wheezing (trace expiratory wheezing) present.  Skin:    General: Skin is warm.     Capillary Refill: Capillary refill takes less than 2 seconds.  Neurological:     General: No focal deficit present.     Mental Status: She is alert and oriented to person, place, and time.  Psychiatric:        Mood and Affect: Mood normal.        Behavior: Behavior normal.        Thought Content: Thought content normal.        Judgment: Judgment normal.       Assessment & Plan:  1. Bronchitis (Primary) - Will treat for suspected bronchitis and get xray due to duration of cough - predniSONE (DELTASONE) 10 MG tablet; 40 mg x 3 days, 20 mg x 3 days, 10 mg x 3 days  Dispense: 21 tablet; Refill: 0 - albuterol (VENTOLIN HFA) 108 (90 Base) MCG/ACT inhaler; Inhale 2 puffs into the lungs every 6 (six) hours as needed for wheezing or shortness of breath.  Dispense: 8 g; Refill: 0 - DG Chest 2 View; Future  Shirline Frees, NP

## 2023-06-19 ENCOUNTER — Encounter: Payer: Self-pay | Admitting: Adult Health

## 2023-06-21 ENCOUNTER — Encounter: Payer: Self-pay | Admitting: Adult Health

## 2023-06-21 NOTE — Telephone Encounter (Signed)
Please advise

## 2023-06-22 ENCOUNTER — Other Ambulatory Visit: Payer: Self-pay | Admitting: Adult Health

## 2023-06-22 MED ORDER — AZITHROMYCIN 250 MG PO TABS: ORAL_TABLET | ORAL | 0 refills | Status: DC

## 2023-06-23 ENCOUNTER — Other Ambulatory Visit: Payer: Self-pay

## 2023-06-23 MED ORDER — AZITHROMYCIN 250 MG PO TABS: ORAL_TABLET | ORAL | 0 refills | Status: AC

## 2023-06-30 DIAGNOSIS — F419 Anxiety disorder, unspecified: Secondary | ICD-10-CM | POA: Diagnosis not present

## 2023-07-08 ENCOUNTER — Other Ambulatory Visit: Payer: Self-pay | Admitting: Adult Health

## 2023-07-08 DIAGNOSIS — J40 Bronchitis, not specified as acute or chronic: Secondary | ICD-10-CM

## 2023-07-14 DIAGNOSIS — F419 Anxiety disorder, unspecified: Secondary | ICD-10-CM | POA: Diagnosis not present

## 2023-07-21 DIAGNOSIS — F419 Anxiety disorder, unspecified: Secondary | ICD-10-CM | POA: Diagnosis not present

## 2023-07-21 DIAGNOSIS — M545 Low back pain, unspecified: Secondary | ICD-10-CM | POA: Diagnosis not present

## 2023-07-28 DIAGNOSIS — F419 Anxiety disorder, unspecified: Secondary | ICD-10-CM | POA: Diagnosis not present

## 2023-08-04 DIAGNOSIS — M6281 Muscle weakness (generalized): Secondary | ICD-10-CM | POA: Diagnosis not present

## 2023-08-04 DIAGNOSIS — F419 Anxiety disorder, unspecified: Secondary | ICD-10-CM | POA: Diagnosis not present

## 2023-08-04 DIAGNOSIS — S39012D Strain of muscle, fascia and tendon of lower back, subsequent encounter: Secondary | ICD-10-CM | POA: Diagnosis not present

## 2023-08-10 DIAGNOSIS — Z6830 Body mass index (BMI) 30.0-30.9, adult: Secondary | ICD-10-CM | POA: Diagnosis not present

## 2023-08-10 DIAGNOSIS — Z713 Dietary counseling and surveillance: Secondary | ICD-10-CM | POA: Diagnosis not present

## 2023-08-11 ENCOUNTER — Encounter: Payer: Self-pay | Admitting: Internal Medicine

## 2023-08-11 DIAGNOSIS — F419 Anxiety disorder, unspecified: Secondary | ICD-10-CM | POA: Diagnosis not present

## 2023-08-11 DIAGNOSIS — M6281 Muscle weakness (generalized): Secondary | ICD-10-CM | POA: Diagnosis not present

## 2023-08-11 DIAGNOSIS — S39012D Strain of muscle, fascia and tendon of lower back, subsequent encounter: Secondary | ICD-10-CM | POA: Diagnosis not present

## 2023-08-15 MED ORDER — PANTOPRAZOLE SODIUM 40 MG PO TBEC: 40.0000 mg | DELAYED_RELEASE_TABLET | Freq: Every day | ORAL | 1 refills | Status: DC

## 2023-08-15 NOTE — Telephone Encounter (Signed)
 Please refill x 6 month worth

## 2023-08-18 ENCOUNTER — Ambulatory Visit: Payer: BC Managed Care – PPO | Admitting: Neurology

## 2023-08-18 DIAGNOSIS — F419 Anxiety disorder, unspecified: Secondary | ICD-10-CM | POA: Diagnosis not present

## 2023-08-18 DIAGNOSIS — S39012D Strain of muscle, fascia and tendon of lower back, subsequent encounter: Secondary | ICD-10-CM | POA: Diagnosis not present

## 2023-08-18 DIAGNOSIS — M6281 Muscle weakness (generalized): Secondary | ICD-10-CM | POA: Diagnosis not present

## 2023-08-25 DIAGNOSIS — M6281 Muscle weakness (generalized): Secondary | ICD-10-CM | POA: Diagnosis not present

## 2023-08-25 DIAGNOSIS — F419 Anxiety disorder, unspecified: Secondary | ICD-10-CM | POA: Diagnosis not present

## 2023-08-25 DIAGNOSIS — S39012D Strain of muscle, fascia and tendon of lower back, subsequent encounter: Secondary | ICD-10-CM | POA: Diagnosis not present

## 2023-09-01 DIAGNOSIS — F419 Anxiety disorder, unspecified: Secondary | ICD-10-CM | POA: Diagnosis not present

## 2023-09-03 ENCOUNTER — Other Ambulatory Visit: Payer: Self-pay | Admitting: Family

## 2023-09-03 DIAGNOSIS — G43109 Migraine with aura, not intractable, without status migrainosus: Secondary | ICD-10-CM

## 2023-09-29 DIAGNOSIS — F419 Anxiety disorder, unspecified: Secondary | ICD-10-CM | POA: Diagnosis not present

## 2023-10-05 ENCOUNTER — Encounter: Payer: Self-pay | Admitting: Internal Medicine

## 2023-10-09 ENCOUNTER — Other Ambulatory Visit: Payer: Self-pay

## 2023-10-09 DIAGNOSIS — G43109 Migraine with aura, not intractable, without status migrainosus: Secondary | ICD-10-CM

## 2023-10-09 MED ORDER — SUMATRIPTAN SUCCINATE 100 MG PO TABS: ORAL_TABLET | ORAL | 1 refills | Status: DC

## 2023-10-13 DIAGNOSIS — F419 Anxiety disorder, unspecified: Secondary | ICD-10-CM | POA: Diagnosis not present

## 2023-10-20 DIAGNOSIS — F419 Anxiety disorder, unspecified: Secondary | ICD-10-CM | POA: Diagnosis not present

## 2023-10-27 DIAGNOSIS — F419 Anxiety disorder, unspecified: Secondary | ICD-10-CM | POA: Diagnosis not present

## 2023-11-03 DIAGNOSIS — F419 Anxiety disorder, unspecified: Secondary | ICD-10-CM | POA: Diagnosis not present

## 2023-11-09 DIAGNOSIS — Z6829 Body mass index (BMI) 29.0-29.9, adult: Secondary | ICD-10-CM | POA: Diagnosis not present

## 2023-11-09 DIAGNOSIS — R519 Headache, unspecified: Secondary | ICD-10-CM | POA: Diagnosis not present

## 2023-11-09 DIAGNOSIS — E663 Overweight: Secondary | ICD-10-CM | POA: Diagnosis not present

## 2023-11-09 DIAGNOSIS — G8929 Other chronic pain: Secondary | ICD-10-CM | POA: Diagnosis not present

## 2023-11-10 DIAGNOSIS — F419 Anxiety disorder, unspecified: Secondary | ICD-10-CM | POA: Diagnosis not present

## 2023-11-11 ENCOUNTER — Other Ambulatory Visit: Payer: Self-pay | Admitting: Internal Medicine

## 2023-11-14 ENCOUNTER — Telehealth: Payer: Self-pay

## 2023-11-14 NOTE — Telephone Encounter (Signed)
 Follow up with pt regards to change Rx from Protonix  to Aciphex. Dr. Charlett okay for it. Pt is aware. Rx is sent.

## 2023-11-14 NOTE — Telephone Encounter (Signed)
 Ok to send in  request for alternative   form protonix  to aciphex  disp 90 days and refill x1  and make sure patient is aware.

## 2023-11-17 DIAGNOSIS — F419 Anxiety disorder, unspecified: Secondary | ICD-10-CM | POA: Diagnosis not present

## 2023-12-01 DIAGNOSIS — F419 Anxiety disorder, unspecified: Secondary | ICD-10-CM | POA: Diagnosis not present

## 2023-12-03 ENCOUNTER — Other Ambulatory Visit: Payer: Self-pay | Admitting: Internal Medicine

## 2023-12-03 DIAGNOSIS — G43109 Migraine with aura, not intractable, without status migrainosus: Secondary | ICD-10-CM

## 2023-12-08 ENCOUNTER — Encounter: Payer: Self-pay | Admitting: Internal Medicine

## 2023-12-08 DIAGNOSIS — F419 Anxiety disorder, unspecified: Secondary | ICD-10-CM | POA: Diagnosis not present

## 2023-12-08 NOTE — Telephone Encounter (Signed)
 Attempted to reach pt's daughter. Left a voicemail to call us  back.

## 2023-12-14 ENCOUNTER — Encounter: Payer: Self-pay | Admitting: Podiatry

## 2023-12-14 ENCOUNTER — Ambulatory Visit (INDEPENDENT_AMBULATORY_CARE_PROVIDER_SITE_OTHER)

## 2023-12-14 ENCOUNTER — Ambulatory Visit: Admitting: Podiatry

## 2023-12-14 DIAGNOSIS — M722 Plantar fascial fibromatosis: Secondary | ICD-10-CM

## 2023-12-14 MED ORDER — MELOXICAM 15 MG PO TABS: 15.0000 mg | ORAL_TABLET | Freq: Every day | ORAL | 3 refills | Status: AC

## 2023-12-14 NOTE — Progress Notes (Signed)
 Subjective:  Patient ID: Debra [name], female    DOB: [birth year],  MRN: [id number]  Chief Complaint  Patient presents with   Plantar Fasciitis    Left foot heel pain x 6 months. Getting worst. 6 pain first thing in the morning. Wearing inserts and Hoka to alleviate the pain.    Discussed the use of AI scribe software for clinical note transcription with the patient, who gave verbal consent to proceed.  History of Present Illness Debra [name] is a 58 year old female who presents with left heel pain.  She experiences pain localized underneath the left heel, which she can sometimes pinpoint by pressing on it. The pain has been present for a while but has worsened over the last few months. It is most pronounced when pressure is applied to a specific area on the plantar medial heel. No pain in the arch or the back of the heel.  She has been using premade orthotic inserts from [facility] for about five weeks. Initially, the inserts provided relief, but subsequently, the pain worsened. She returned to the provider who exchanged the inserts, but she remains uncertain about their effectiveness. She takes ibuprofen occasionally for aches and pains but does not take it regularly.  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 Wearing supportive shoes alleviates her symptoms, whereas wearing less supportive shoes exacerbates the discomfort. She avoids going barefoot and wearing flimsy shoes to prevent worsening of her symptoms.  She has a history of a successful steroid injection in her right foot for a similar issue, which improved her symptoms significantly.      Objective:    Physical Exam VASCULAR: DP and PT pulse palpable. Foot is warm and well-perfused. Capillary fill time is brisk. DERMATOLOGIC: Normal skin turgor, texture, and temperature. No open lesions, rashes, or ulcerations. NEUROLOGIC: Normal sensation to light touch and pressure. No paresthesias  on examination. ORTHOPEDIC: Slight gastrocnemius equinus. Pain on palpation of the plantar medial heel at the medial band insertion. No pain in midfascia. Radiograph shows no plantar heel spur, small posterior calcaneal enthesophyte. Smooth pain-free range of motion of all examined joints. No ecchymosis or bruising. No gross deformity.   No images are attached to the encounter.    Results Left foot radiograph: No plantar heel spur, small posterior calcaneal enthesophyte. (12/14/2023)   Assessment:   1. Plantar fasciitis of left foot      Plan:  Patient was evaluated and treated and all questions answered.  Assessment and Plan Assessment & Plan Chronic plantar fasciitis of the left foot Pain localized to the plantar medial heel at the insertion of the medial band on the plantar fascia, worsened over the past few months. Radiograph shows no plantar heel spur, favorable for recovery. Premade orthotic inserts have not provided relief. No significant pain in the midfascia or other deformities. Likely exacerbated by calf muscle tightness and foot muscle weakness. - Prescribe meloxicam  15 mg once daily for four weeks to manage inflammation. - Provide a home exercise plan to be performed twice daily to address muscle weakness and calf tightness. - Administer a steroid injection into the heel to reduce inflammation and pain. - Advise wearing supportive shoes like Hokas and avoiding flimsy shoes or going barefoot until symptoms improve. - Discuss potential side effects of meloxicam , including gastric reflux and heartburn, and advise to discontinue if these occur.    After sterile prep with povidone-iodine solution and alcohol, the left heel was injected  with 0.5cc 2% xylocaine  plain, 0.5cc 0.5% marcaine plain, 20 mg triamcinolone  acetonide, and 4 mg dexamethasone  was injected along the medial plantar fascia at the insertion on the plantar calcaneus. The patient tolerated the procedure well  without complication.   Return if symptoms worsen or fail to improve.

## 2023-12-14 NOTE — Patient Instructions (Signed)
 VISIT SUMMARY: You came in today because of pain in your left heel, which has been getting worse over the past few months. The pain is most noticeable when you press on a specific spot on the bottom of your heel. You have tried using orthotic inserts and different types of shoes, but the pain persists.  YOUR PLAN: -CHRONIC PLANTAR FASCIITIS OF THE LEFT FOOT: Plantar fasciitis is inflammation of the band of tissue that runs across the bottom of your foot and connects your heel bone to your toes. To help manage this, you will take meloxicam  15 mg once daily for four weeks to reduce inflammation. You will also follow a home exercise plan twice daily to strengthen your foot muscles and stretch your calf muscles. A steroid injection was given today to help reduce inflammation and pain. Continue wearing supportive shoes like Hokas and avoid flimsy shoes or going barefoot until your symptoms improve. Be aware of potential side effects of meloxicam , such as gastric reflux and heartburn, and stop taking it if these occur.  INSTRUCTIONS: Please follow up in four weeks to assess your progress and determine if further treatment is needed.                      Contains text generated by Abridge.           Plantar Fasciitis (Heel Spur Syndrome) with Rehab The plantar fascia is a fibrous, ligament-like, soft-tissue structure that spans the bottom of the foot. Plantar fasciitis is a condition that causes pain in the foot due to inflammation of the tissue. SYMPTOMS  Pain and tenderness on the underneath side of the foot. Pain that worsens with standing or walking. CAUSES  Plantar fasciitis is caused by irritation and injury to the plantar fascia on the underneath side of the foot. Common mechanisms of injury include: Direct trauma to bottom of the foot. Damage to a small nerve that runs under the foot where the main fascia attaches to the heel bone. Stress placed on the plantar  fascia due to bone spurs. RISK INCREASES WITH:  Activities that place stress on the plantar fascia (running, jumping, pivoting, or cutting). Poor strength and flexibility. Improperly fitted shoes. Tight calf muscles. Flat feet. Failure to warm-up properly before activity. Obesity. PREVENTION Warm up and stretch properly before activity. Allow for adequate recovery between workouts. Maintain physical fitness: Strength, flexibility, and endurance. Cardiovascular fitness. Maintain a health body weight. Avoid stress on the plantar fascia. Wear properly fitted shoes, including arch supports for individuals who have flat feet.  PROGNOSIS  If treated properly, then the symptoms of plantar fasciitis usually resolve without surgery. However, occasionally surgery is necessary.  RELATED COMPLICATIONS  Recurrent symptoms that may result in a chronic condition. Problems of the lower back that are caused by compensating for the injury, such as limping. Pain or weakness of the foot during push-off following surgery. Chronic inflammation, scarring, and partial or complete fascia tear, occurring more often from repeated injections.  TREATMENT  Treatment initially involves the use of ice and medication to help reduce pain and inflammation. The use of strengthening and stretching exercises may help reduce pain with activity, especially stretches of the Achilles tendon. These exercises may be performed at home or with a therapist. Your caregiver may recommend that you use heel cups of arch supports to help reduce stress on the plantar fascia. Occasionally, corticosteroid injections are given to reduce inflammation. If symptoms persist for greater than 6 months despite non-surgical (  conservative), then surgery may be recommended.   MEDICATION  If pain medication is necessary, then nonsteroidal anti-inflammatory medications, such as aspirin and ibuprofen, or other minor pain relievers, such as  acetaminophen , are often recommended. Do not take pain medication within 7 days before surgery. Prescription pain relievers may be given if deemed necessary by your caregiver. Use only as directed and only as much as you need. Corticosteroid injections may be given by your caregiver. These injections should be reserved for the most serious cases, because they may only be given a certain number of times.  HEAT AND COLD Cold treatment (icing) relieves pain and reduces inflammation. Cold treatment should be applied for 10 to 15 minutes every 2 to 3 hours for inflammation and pain and immediately after any activity that aggravates your symptoms. Use ice packs or massage the area with a piece of ice (ice massage). Heat treatment may be used prior to performing the stretching and strengthening activities prescribed by your caregiver, physical therapist, or athletic trainer. Use a heat pack or soak the injury in warm water.  SEEK IMMEDIATE MEDICAL CARE IF: Treatment seems to offer no benefit, or the condition worsens. Any medications produce adverse side effects.  EXERCISES- RANGE OF MOTION (ROM) AND STRETCHING EXERCISES - Plantar Fasciitis (Heel Spur Syndrome) These exercises may help you when beginning to rehabilitate your injury. Your symptoms may resolve with or without further involvement from your physician, physical therapist or athletic trainer. While completing these exercises, remember:  Restoring tissue flexibility helps normal motion to return to the joints. This allows healthier, less painful movement and activity. An effective stretch should be held for at least 30 seconds. A stretch should never be painful. You should only feel a gentle lengthening or release in the stretched tissue.  RANGE OF MOTION - Toe Extension, Flexion Sit with your right / left leg crossed over your opposite knee. Grasp your toes and gently pull them back toward the top of your foot. You should feel a stretch on  the bottom of your toes and/or foot. Hold this stretch for 10 seconds. Now, gently pull your toes toward the bottom of your foot. You should feel a stretch on the top of your toes and or foot. Hold this stretch for 10 seconds. Repeat  times. Complete this stretch 3 times per day.   RANGE OF MOTION - Ankle Dorsiflexion, Active Assisted Remove shoes and sit on a chair that is preferably not on a carpeted surface. Place right / left foot under knee. Extend your opposite leg for support. Keeping your heel down, slide your right / left foot back toward the chair until you feel a stretch at your ankle or calf. If you do not feel a stretch, slide your bottom forward to the edge of the chair, while still keeping your heel down. Hold this stretch for 10 seconds. Repeat 3 times. Complete this stretch 2 times per day.   STRETCH  Gastroc, Standing Place hands on wall. Extend right / left leg, keeping the front knee somewhat bent. Slightly point your toes inward on your back foot. Keeping your right / left heel on the floor and your knee straight, shift your weight toward the wall, not allowing your back to arch. You should feel a gentle stretch in the right / left calf. Hold this position for 10 seconds. Repeat 3 times. Complete this stretch 2 times per day.  STRETCH  Soleus, Standing Place hands on wall. Extend right / left leg, keeping the  other knee somewhat bent. Slightly point your toes inward on your back foot. Keep your right / left heel on the floor, bend your back knee, and slightly shift your weight over the back leg so that you feel a gentle stretch deep in your back calf. Hold this position for 10 seconds. Repeat 3 times. Complete this stretch 2 times per day.  STRETCH  Gastrocsoleus, Standing  Note: This exercise can place a lot of stress on your foot and ankle. Please complete this exercise only if specifically instructed by your caregiver.  Place the ball of your right / left foot  on a step, keeping your other foot firmly on the same step. Hold on to the wall or a rail for balance. Slowly lift your other foot, allowing your body weight to press your heel down over the edge of the step. You should feel a stretch in your right / left calf. Hold this position for 10 seconds. Repeat this exercise with a slight bend in your right / left knee. Repeat 3 times. Complete this stretch 2 times per day.   STRENGTHENING EXERCISES - Plantar Fasciitis (Heel Spur Syndrome)  These exercises may help you when beginning to rehabilitate your injury. They may resolve your symptoms with or without further involvement from your physician, physical therapist or athletic trainer. While completing these exercises, remember:  Muscles can gain both the endurance and the strength needed for everyday activities through controlled exercises. Complete these exercises as instructed by your physician, physical therapist or athletic trainer. Progress the resistance and repetitions only as guided.  STRENGTH - Towel Curls Sit in a chair positioned on a non-carpeted surface. Place your foot on a towel, keeping your heel on the floor. Pull the towel toward your heel by only curling your toes. Keep your heel on the floor. Repeat 3 times. Complete this exercise 2 times per day.  STRENGTH - Ankle Inversion Secure one end of a rubber exercise band/tubing to a fixed object (table, pole). Loop the other end around your foot just before your toes. Place your fists between your knees. This will focus your strengthening at your ankle. Slowly, pull your big toe up and in, making sure the band/tubing is positioned to resist the entire motion. Hold this position for 10 seconds. Have your muscles resist the band/tubing as it slowly pulls your foot back to the starting position. Repeat 3 times. Complete this exercises 2 times per day.  Document Released: 05/09/2005 Document Revised: 08/01/2011 Document Reviewed:  08/21/2008 [facility] Patient Information 2014 [place], [place].

## 2023-12-15 DIAGNOSIS — F419 Anxiety disorder, unspecified: Secondary | ICD-10-CM | POA: Diagnosis not present

## 2024-02-01 ENCOUNTER — Emergency Department (HOSPITAL_COMMUNITY)
Admission: EM | Admit: 2024-02-01 | Discharge: 2024-02-01 | Disposition: A | Discharge status: Home / Self Care | Attending: Emergency Medicine | Admitting: Emergency Medicine

## 2024-02-01 ENCOUNTER — Other Ambulatory Visit: Payer: Self-pay

## 2024-02-01 ENCOUNTER — Encounter (HOSPITAL_COMMUNITY): Payer: Self-pay

## 2024-02-01 ENCOUNTER — Other Ambulatory Visit: Payer: Self-pay | Admitting: Internal Medicine

## 2024-02-01 ENCOUNTER — Emergency Department (HOSPITAL_COMMUNITY)

## 2024-02-01 DIAGNOSIS — R202 Paresthesia of skin: Secondary | ICD-10-CM | POA: Insufficient documentation

## 2024-02-01 DIAGNOSIS — G43109 Migraine with aura, not intractable, without status migrainosus: Secondary | ICD-10-CM

## 2024-02-01 DIAGNOSIS — M25512 Pain in left shoulder: Secondary | ICD-10-CM | POA: Diagnosis not present

## 2024-02-01 DIAGNOSIS — R079 Chest pain, unspecified: Secondary | ICD-10-CM | POA: Diagnosis present

## 2024-02-01 LAB — BASIC METABOLIC PANEL WITH GFR
Anion gap: 10 (ref 5–15)
BUN: 16 mg/dL (ref 6–20)
CO2: 25 mmol/L (ref 22–32)
Calcium: 9 mg/dL (ref 8.9–10.3)
Chloride: 102 mmol/L (ref 98–111)
Creatinine, Ser: 0.88 mg/dL (ref 0.44–1.00)
GFR, Estimated: >60 mL/min (ref >60)
Glucose, Bld: 84 mg/dL (ref 70–99)
Potassium: 4.1 mmol/L (ref 3.5–5.1)
Sodium: 137 mmol/L (ref 135–145)

## 2024-02-01 LAB — CBC
HCT: 40.8 % (ref 36.0–46.0)
Hemoglobin: 13.8 g/dL (ref 12.0–15.0)
MCH: 32.1 pg (ref 26.0–34.0)
MCHC: 33.8 g/dL (ref 30.0–36.0)
MCV: 94.9 fL (ref 80.0–100.0)
Platelets: 280 K/uL (ref 150–400)
RBC: 4.3 MIL/uL (ref 3.87–5.11)
RDW: 13.3 % (ref 11.5–15.5)
WBC: 7.6 K/uL (ref 4.0–10.5)
nRBC: 0 % (ref 0.0–0.2)

## 2024-02-01 LAB — TROPONIN I (HIGH SENSITIVITY)
Troponin I (High Sensitivity): <2 ng/L (ref <18)
Troponin I (High Sensitivity): <2 ng/L (ref <18)

## 2024-02-01 LAB — D-DIMER, QUANTITATIVE: D-Dimer, Quant: 0.81 ug{FEU}/mL — ABNORMAL HIGH (ref 0.00–0.50)

## 2024-02-01 MED ORDER — KETOROLAC TROMETHAMINE 15 MG/ML IJ SOLN
15.0000 mg | Freq: Once | INTRAMUSCULAR | Status: AC
  Administered 2024-02-01: 15 mg via INTRAMUSCULAR
  Filled 2024-02-01: qty 1

## 2024-02-01 MED ORDER — KETOROLAC TROMETHAMINE 15 MG/ML IJ SOLN: 15.0000 mg | Freq: Once | INTRAMUSCULAR | Status: DC

## 2024-02-01 MED ORDER — IOHEXOL 350 MG/ML SOLN
75.0000 mL | Freq: Once | INTRAVENOUS | Status: AC | PRN
  Administered 2024-02-01: 75 mL via INTRAVENOUS

## 2024-02-01 NOTE — ED Triage Notes (Signed)
 Pt c/o of left shoulder & arm pain. Onset last night. Pt rates pain 7/10; A&Ox4. Denies N/V/D, no headache at the moment. Pt states she had a migraine this morning but took 100 mg of Imitrex  and pain resolved within an hour.

## 2024-02-01 NOTE — Discharge Instructions (Signed)
 Pleasure to care of you today.  You were seen for chest pain with some tingling in your left arm.  Your workup was reassuring, feeling with your heart, no sign of blood clot in your lungs, no pneumonia or other concerning features.  We are glad you are feeling better.  Please follow-up closely with PCP and with cardiology who you have seen for this type of pain in the past to see if you need any further workup.

## 2024-02-01 NOTE — ED Provider Notes (Signed)
 [place] EMERGENCY DEPARTMENT AT [facility] Provider Note   CSN: [id number] Arrival date & time: 02/01/24  1050     Patient presents with: Chest Pain   Debra [name] is a 58 y.o. female.  He has history of migraines,, esophagitis, dyslipidemia.  Presents to ER for left shoulder pain with sensation of tingling on the inside of the arm down to her hand.  States this started last night, she did not think anything of it and thought it may be musculoskeletal.  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  Pain is not provoked by exertion, does not change with position or movement.  She denies any injury or trauma, she does have some pain in the posterior shoulder but no pain in her back, no pain with respiration, denies lower extremity pain or swelling.  She does states she is on estrogen patches and progesterone  for perimenopause symptoms.  She had migraine when she was up this morning that resolved with her Imitrex  and felt like her usual migraine.  She did not have any vision difficulties, trouble speaking, weakness in her extremities or other associated symptoms with this.  Patient does not smoke, does not drink alcohol or use drugs, no family history of early onset CAD.  She does not have hypertension.    Chest Pain      Prior to Admission medications   Medication Sig Start Date End Date Taking? Authorizing Provider  albuterol  (VENTOLIN  HFA) 108 (90 Base) MCG/ACT inhaler TAKE 2 PUFFS BY MOUTH EVERY 6 HOURS AS NEEDED FOR WHEEZE OR SHORTNESS OF BREATH 07/11/23   Nafziger, Darleene, NP  Cholecalciferol (VITAMIN D) 50 MCG (2000 UT) CAPS Vitamin D Patient not taking: Reported on 12/14/2023    [provider]  estradiol  (VIVELLE -DOT) 0.0375 MG/24HR APPLY 1 PATCH TOPICALLY TO THE SKIN 2 TIMES A WEEK     [provider]  ibuprofen (ADVIL,MOTRIN) 200 MG tablet Take 600 mg by mouth every 6 (six) hours as needed for moderate pain.    [provider]  meloxicam  (MOBIC ) 15 MG tablet Take 1 tablet (15 mg total) by mouth daily. 12/14/23   McDonald, Juliene SAUNDERS, DPM  predniSONE  (DELTASONE ) 10 MG tablet 40 mg x 3 days, 20 mg x 3 days, 10 mg x 3 days Patient not taking: Reported on 12/14/2023 06/16/23   Nafziger, Cory, NP  progesterone  (PROMETRIUM ) 100 MG capsule Take 100 mg by mouth daily. 12/05/22   [provider]  RABEprazole (ACIPHEX) 20 MG tablet Take 1 tablet (20 mg total) by mouth daily. 11/14/23   Panosh, Apolinar POUR, MD  SUMAtriptan  (IMITREX ) 100 MG tablet TAKE 1 TABLET BY MOUTH IMMEDIATELY MAY REPEAT AFTER 2 HOURS-- 12/05/23   Panosh, Wanda K, MD    Allergies: Gadolinium, Penicillins, and Sulfonamide derivatives    Review of Systems  Cardiovascular:  Positive for chest pain.    Updated Vital Signs BP 130/75 (BP Location: Right Arm)   Pulse 69   Temp 98.6 F (37 C) (Oral)   Resp 16   Ht 5' 7 (1.702 m)   Wt 86.2 kg   LMP 07/22/2023 (Approximate)   SpO2 97%   BMI 29.76 kg/m   Physical Exam Vitals and nursing note reviewed.  Constitutional:      General: She is  not in acute distress.    Appearance: She is well-developed.  HENT:     Head: Normocephalic and atraumatic.  Eyes:     General: No visual field deficit.    Conjunctiva/sclera: Conjunctivae normal.  Cardiovascular:     Rate and Rhythm: Normal rate and regular rhythm.     Heart sounds: No murmur heard. Pulmonary:     Effort: Pulmonary effort is normal. No respiratory distress.     Breath sounds: Normal breath sounds.  Abdominal:     Palpations: Abdomen is soft.     Tenderness: There is no abdominal tenderness.  Musculoskeletal:        General: No swelling.     Cervical back: Neck supple.     Right lower leg: No tenderness. No edema.     Left lower leg: No tenderness. No edema.  Skin:    General:  Skin is warm and dry.     Capillary Refill: Capillary refill takes less than 2 seconds.  Neurological:     General: No focal deficit present.     Mental Status: She is alert and oriented to person, place, and time.     Cranial Nerves: No cranial nerve deficit, dysarthria or facial asymmetry.     Sensory: Sensation is intact.     Motor: No weakness.  Psychiatric:        Mood and Affect: Mood normal.     (all labs ordered are listed, but only abnormal results are displayed) Labs Reviewed  BASIC METABOLIC PANEL WITH GFR  CBC  D-DIMER, QUANTITATIVE  TROPONIN I (HIGH SENSITIVITY)  TROPONIN I (HIGH SENSITIVITY)    EKG: EKG Interpretation Date/Time:  Thursday February 01 2024 11:01:51 EDT Ventricular Rate:  67 PR Interval:  163 QRS Duration:  72 QT Interval:  394 QTC Calculation: 416 R Axis:   43  Text Interpretation: Sinus rhythm Low voltage, precordial leads No significant change since prior 7/11 Confirmed by Towana Sharper [phone]) on 02/01/2024 11:08:20 AM  Radiology: DG Chest 2 View Result Date: 02/01/2024 EXAM: 2 VIEW(S) XRAY OF THE CHEST 02/01/2024 11:22:10 AM COMPARISON: 04/25/2024 CLINICAL HISTORY: Chest pain. Pt c/o of left shoulder \\T \ arm pain. Onset last night. Pt rates pain 7/10; A\T\Ox4. Denies N/V/D, no headache at the moment. Pt states she had a migraine this morning but took 100 mg of Imitrex  and pain resolved within an hour. FINDINGS: LUNGS AND PLEURA: Slightly low lung volumes. No focal pulmonary opacity. No pulmonary edema. No pleural effusion. No pneumothorax. HEART AND MEDIASTINUM: No acute abnormality of the cardiac and mediastinal silhouettes. BONES AND SOFT TISSUES: Degenerative changes in the visualized thoracic spine. No acute osseous abnormality. LINES AND TUBES: Multiple telemetry leads overlying the chest in appropriate position. IMPRESSION: 1. No acute cardiopulmonary pathology. Electronically signed by: Donnice Mania MD 02/01/2024 11:27 AM EDT RP  Workstation: [id number]     Procedures   Medications Ordered in the ED  ketorolac  (TORADOL ) 15 MG/ML injection 15 mg (has no administration in time range)                                    Medical Decision Making The patient presented today for chest pain that had started last night. EKG showed sinus rhythm, Chest Xray was independently reviewed by me and shows no pulmonary edema or infiltrate. I agree with radiology interpretation.   Her symptoms have resolved in the ER.  I considered a broad  differential including but not limited to ACS, PE, Dissection, pneumothorax, costochondritis, pneumonia, GERD, pericarditis, and pericardial effusion.  PE was considered, patient is on estrogen therapy, D-dimer ordered but she has no other risk factors.  This was positive, CTA was negative for PE or other acute findings.  I feel disssection is unlikely  Symptoms and EKG are consistent with pericarditis  Their heart score is 2. Troponins show negative x 2  Plan is for charged home.  Patient has had similar symptoms in the past, has a cardiology note that was reviewed by me from July 2024 for similar symptoms with a left shoulder pain and tingling into the left arm and hand.  She had had prior calcium score of 0, they did plain old treadmill test that was negative.  Discussed with patient she can follow-up with cardiology but at that time was felt to be unlikely cardiac in nature.  She has been asymptomatic in the ER.  She is agreeable to plan of care and discharge.   Amount and/or Complexity of Data Reviewed Labs: ordered. Radiology: ordered.  Risk Prescription drug management.        Final diagnoses:  None    ED Discharge Orders     None          Suellen Sherran DELENA DEVONNA 02/01/24 1810    Towana Ozell BROCKS, MD 02/02/24 1726

## 2024-02-22 ENCOUNTER — Encounter: Payer: Self-pay | Admitting: Internal Medicine

## 2024-02-22 ENCOUNTER — Other Ambulatory Visit: Payer: Self-pay | Admitting: Medical Genetics

## 2024-02-22 ENCOUNTER — Ambulatory Visit (INDEPENDENT_AMBULATORY_CARE_PROVIDER_SITE_OTHER): Admitting: Internal Medicine

## 2024-02-22 VITALS — BP 112/80 | HR 73 | Temp 97.9°F | Ht 67.0 in | Wt 191.8 lb

## 2024-02-22 DIAGNOSIS — M25512 Pain in left shoulder: Secondary | ICD-10-CM | POA: Diagnosis not present

## 2024-02-22 DIAGNOSIS — R079 Chest pain, unspecified: Secondary | ICD-10-CM | POA: Diagnosis not present

## 2024-02-22 NOTE — Patient Instructions (Addendum)
 Get shoulder checked out  Would even consider  cervical  condition that could have caused the  arm pain .  And possible  neck trapezius .   Fu with cards even though prob  not cardiac .   Get advice about adaptic exercise .  As discussed .  Will do SM referral .  Try meloxicam   for about 10 days .  In interim  for antiinflammatory trial .

## 2024-02-22 NOTE — Progress Notes (Signed)
 Chief Complaint  Patient presents with   Hospitalization Follow-up    Pt reports she have not had any chest pain episode. L shoulder still bothers her. On and off.    Shoulder Pain    HPI: Debra [name] 58 y.o. come in for fu     ed visit.  Cp at night one time that persisted and radiated . Went to ed  wasn't sure  if related  shoulder  to fingertips ..  pain and numbness   and ed pain  Had bad ha that am also .  Coworkers advised she get Ed evaluation  Had neg eval  chest ct for slightly evelated  d dimer  . Not seen cards yet.  For a few months without  injury shoulder left   discomfort waxes and wanes   limited rom at times   ROS: See pertinent positives and negatives per HPI. No current cp sob   no neck pain   Past Medical History:  Diagnosis Date   Chronic headaches    ESOPHAGITIS, HX OF 02/18/2010   EXTERNAL HEMORRHOIDS 02/18/2010   Gallstones    GERD 03/13/2007   LIVER FUNCTION TESTS, ABNORMAL 01/06/2010   Ovarian cyst    PANCREATITIS, ACUTE 02/18/2010   Qualifier: Diagnosis of  By: Marcelo CMA (AAMA), Dottie     PARESTHESIA 03/13/2007   Ruptured ovarian cyst    VARICOSE VEIN 01/06/2010    Family History  Problem Relation Age of Onset   Stroke Mother    Migraines Mother    Lung cancer Mother    Diabetes Mother    Neuropathy Father    Alcohol abuse Father    Cardiomyopathy Father    Anemia Father    Other Father        Morse. Heart Beat   Migraines Brother    Other Brother        Irr. Heart Beat   Migraines Maternal Aunt    Colon cancer Maternal Grandmother    Pancreatitis Maternal Grandfather    Esophageal cancer Neg Hx    Rectal cancer Neg Hx    Stomach cancer Neg Hx     Social History   Socioeconomic History   Marital status: Married    Spouse name: Not on file   Number of children: 0   Years of education: Not on file   Highest education level: Bachelor's degree (e.g., BA, AB, BS)  Occupational History   Occupation: PHARMACIST     Employer: [employer]  Tobacco Use   Smoking status: Never   Smokeless tobacco: Never  Vaping Use   Vaping status: Never Used  Substance and Sexual Activity   Alcohol use: Yes    Comment: rarely   Drug use: Never   Sexual activity: Not on file  Other Topics Concern   Not on file  Social History Narrative   Dogs 2 married is a Teacher, early years/pre    Are you right handed or left handed? Right   Are you currently employed ? 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 N   Who lives with you?  Husband, Daughter, Brother   What type of home do you live in: 1 story or 2 story? 1                   Social Drivers of Corporate investment banker Strain: Low Risk  (02/21/2024)   Overall Physicist, medical Strain (  CARDIA)    Difficulty of Paying Living Expenses: Not hard at all  Food Insecurity: No Food Insecurity (02/21/2024)   Hunger Vital Sign    Worried About Running Out of Food in the Last Year: Never true    Ran Out of Food in the Last Year: Never true  Transportation Needs: No Transportation Needs (02/21/2024)   PRAPARE - Administrator, Civil Service (Medical): No    Lack of Transportation (Non-Medical): No  Physical Activity: Sufficiently Active (02/21/2024)   Exercise Vital Sign    Days of Exercise per Week: 4 days    Minutes of Exercise per Session: 40 min  Stress: No Stress Concern Present (02/21/2024)   [employer] of Occupational Health - Occupational Stress Questionnaire    Feeling of Stress: Not at all  Social Connections: Socially Integrated (02/21/2024)   Social Connection and Isolation Panel    Frequency of Communication with Friends and Family: More than three times a week    Frequency of Social Gatherings with Friends and Family: Once a week    Attends Religious Services: More than 4 times per year    Active Member of [employer] or Organizations: Yes    Attends Engineer, structural: More than 4 times per year     Marital Status: Married    Outpatient Medications Prior to Visit  Medication Sig Dispense Refill   Cholecalciferol (VITAMIN D) 50 MCG (2000 UT) CAPS Vitamin D (Patient taking differently: 3x a day.)     estradiol  (VIVELLE -DOT) 0.0375 MG/24HR APPLY 1 PATCH TOPICALLY TO THE SKIN 2 TIMES A WEEK     NONFORMULARY OR COMPOUNDED ITEM once a week. Lipo Slim 55 units once a week. Compound Rx from her OBGYN, Dr. Mat.     progesterone  (PROMETRIUM ) 100 MG capsule Take 100 mg by mouth daily.     RABEprazole (ACIPHEX) 20 MG tablet Take 1 tablet (20 mg total) by mouth daily. 90 tablet 1   SUMAtriptan  (IMITREX ) 100 MG tablet TAKE 1 TABLET BY MOUTH IMMEDIATELY MAY REPEAT AFTER 2 HOURS-- 9 tablet 1   ibuprofen (ADVIL,MOTRIN) 200 MG tablet Take 600 mg by mouth every 6 (six) hours as needed for moderate pain.     meloxicam  (MOBIC ) 15 MG tablet Take 1 tablet (15 mg total) by mouth daily. 30 tablet 3   albuterol  (VENTOLIN  HFA) 108 (90 Base) MCG/ACT inhaler TAKE 2 PUFFS BY MOUTH EVERY 6 HOURS AS NEEDED FOR WHEEZE OR SHORTNESS OF BREATH 18 each 0   predniSONE  (DELTASONE ) 10 MG tablet 40 mg x 3 days, 20 mg x 3 days, 10 mg x 3 days (Patient not taking: Reported on 12/14/2023) 21 tablet 0   Facility-Administered Medications Prior to Visit  Medication Dose Route Frequency Provider Last Rate Last Admin   0.9 %  sodium chloride  infusion  500 mL Intravenous Once Danis, Henry L III, MD         EXAM:  BP 112/80 (BP Location: Left Arm, Patient Position: Sitting, Cuff Size: Normal)   Pulse 73   Temp 97.9 F (36.6 C) (Oral)   Ht 5' 7 (1.702 m)   Wt 191 lb 12.8 oz (87 kg)   LMP 07/22/2023 (Approximate)   SpO2 99%   BMI 30.04 kg/m   Body mass index is 30.04 kg/m.  GENERAL: vitals reviewed and listed above, alert, oriented, appears well hydrated and in no acute distress HEENT: atraumatic, conjunctiva  clear, no obvious abnormalities on inspection of external nose and ears  NECK:  no obvious masses on inspection  palpation  no midline tender  LUNGS: clear to auscultation bilaterally, no wheezes, rales or rhonchi, good air movement CV: HRRR, no clubbing cyanosis or  peripheral edema nl cap refill  MS: moves all extremities dec rom left shoulder  interna roation from pain .  PSYCH: pleasant and cooperative, no obvious depression or anxiety Neuro : dtrs UE  no obv weakness or atrophy. Lab Results  Component Value Date   WBC 7.6 02/01/2024   HGB 13.8 02/01/2024   HCT 40.8 02/01/2024   PLT 280 02/01/2024   GLUCOSE 84 02/01/2024   CHOL 194 07/26/2022   TRIG 65.0 07/26/2022   HDL 64.10 07/26/2022   LDLCALC 117 (H) 07/26/2022   ALT 13 06/09/2022   AST 20 06/09/2022   NA 137 02/01/2024   K 4.1 02/01/2024   CL 102 02/01/2024   CREATININE 0.88 02/01/2024   BUN 16 02/01/2024   CO2 25 02/01/2024   TSH 2.25 07/26/2022   HGBA1C 5.8 07/26/2022   BP Readings from Last 3 Encounters:  02/22/24 112/80  02/01/24 114/69  06/16/23 122/80    ASSESSMENT AND PLAN:  Discussed the following assessment and plan:  Left shoulder pain, unspecified chronicity - Plan: Ambulatory referral to Sports Medicine  Left-sided chest pain - Plan: Ambulatory referral to Sports Medicine Hx of migraines   Suspect shoulder  and possible radicular ? Sx  other cause  See cards fu .  Ask Dr Skeet at [facility] fu opinion  about  the left arm pain and numbness  that brought her to the ed.  Also Add antiinflammatory for 7- 10 days  SM referral consult    needs eval and rx and advice on optimal exercise  -Patient advised to return or notify health care team  if  new concerns arise.  Patient Instructions  Get shoulder checked out  Would even consider  cervical  condition that could have caused the  arm pain .  And possible  neck trapezius .   Fu with cards even though prob  not cardiac .   Get advice about adaptic exercise .  As discussed .  Will do SM referral .  Try meloxicam   for about 10 days .  In interim  for  antiinflammatory trial .

## 2024-02-22 NOTE — Progress Notes (Unsigned)
 NEUROLOGY FOLLOW UP OFFICE NOTE  Debra [name] [id number]  Assessment/Plan:   Migraine without aura, without status migrainosus, not intractable Left shoulder pain/arm numbness - may be radiation from a trigger point, shoulder pathology/impingement.  Cervical radiculopathy is possible despite absence of neck pain.  I feel that brachial plexopathy is less likely.   Check NCV-EMG of left upper extremity Agree with referral to Sports Medicine.   Sumatriptan  as needed for migraine Pharmacologic migraine prevention not indicated Limit use of pain relievers to no more than 9 days out of the month to prevent risk of rebound or medication-overuse headache. Keep headache diary Further recommendations pending NCV-EMG results and Sports Medicine assessment.  Otherwise, follow up one year or sooner if needed.   Total time spent on today's visit was 42 minutes dedicated to this patient today, preparing to see patient, examining the patient, ordering tests and/or medications and counseling the patient, documenting clinical information in the EHR or other health record, independently interpreting results and communicating results to the patient/family, discussing treatment and goals, answering patient's questions and coordinating care.     Subjective:  Debra [name] is a 58 year old female who follows up for migraines.  History supplemented by ED note.  UPDATE: Migraines: She tried a perimenstrual prophylaxis with naratriptan , which was ineffective.  However, as she has started moving past perimenopause, the migraines have become more manageable. Intensity:  moderate Duration:  45-90 minutes with sumatriptan   Frequency:  2 a month.  Left shoulder pain/arm numbness: She began experiencing left shoulder pain about 4 months ago.  Notes pain at the [facility] joint, scapula and posterior upper arm, like an aching, aggravated with certain movements such as raising her arm.  No weakness.  In  retrospect, she possibly may have strained her shoulder while lifting heavy furniture around that time.  On the evening of 9/10, along with the shoulder pain, she started experiencing left upper chest pain and paresthesias around the left elbow and cubital fossa and fingertips of the 2nd to 4th digits.  No associated weakness.  She put Biofreeze on her shoulder and went to bed.  The next morning, symptoms were still present.  She also had a habitual migraine that morning and took her sumatriptan , which aborted it.  She went to the ED that day for further evaluation.  EKG negative for ACS.  Troponins were negative x2.  D-dimer slightly elevated but she is on estrogen therapy.  CTA chest negative for PE.  She followed up with her PCP who has referred her to Sports Medicine  She has previously had a negative cardiac workup for atypical left sided chest pain, but this was different.    Current NSAIDS/analgesics:  ibuprofen Current triptans:  naratriptan  1mg  twice daily for 6 days at start of menses, sumatriptan  100mg  Current ergotamine:  none Current anti-emetic:  none Current muscle relaxants:  none Current Antihypertensive medications:  none Current Antidepressant medications:  none Current Anticonvulsant medications:  none Current anti-CGRP:  none Current Vitamins/Herbal/Supplements:  D Current Antihistamines/Decongestants:  none Other therapy:  none Hormone:  estradiol , progesterone    Caffeine :  1 to 2 iced latte Diet:  Ryan, seltzer, no soda.  Does not skip meals Exercise:  walks 4 days a week, gym 2 days a week Depression:  no; Anxiety:  no Sleep hygiene:  good  HISTORY: History of migraines since high school.  They were menstrual, occurring 2 to 3 times a month usually week of menses.  Earlier this year,  they started becoming more frequent, occurring every other day.  Responds to sumatriptan , lasting about an hour.  She started experiencing lightheadedness as well.  Headaches are a  non-throbbing unilateral (either side) pain.  Associated with nausea, vomiting, photophobia and phonophobia.  She eventually started experiencing hot flashes and was found to be perimenopausal.  She was started on estradiol  and progesterone  and migraines ceased.  She didn't have a period for 3 months.  Then the following month she had a period and had frequent migraines for that entire week.  She will often know the day before that she is going to have her period.  Prior workup for headaches includes CT head on 03/01/2013 which was personally reviewed and was unremarkable.  Past NSAIDS/analgesics:  Fioricet, tramadol  Past abortive triptans:  none Past abortive ergotamine:  none Past muscle relaxants:  Flexeril  Past anti-emetic:  Zofran , Phenergan  Past antihypertensive medications:  none Past antidepressant medications:  none Past anticonvulsant medications:  topiramate  Past anti-CGRP:  none Past vitamins/Herbal/Supplements:  none Past antihistamines/decongestants:  Flonase  Other past therapies:  none   Family history of headache:  mom, maternal aunt, brother  PAST MEDICAL HISTORY: Past Medical History:  Diagnosis Date   Chronic headaches    ESOPHAGITIS, HX OF 02/18/2010   EXTERNAL HEMORRHOIDS 02/18/2010   Gallstones    GERD 03/13/2007   LIVER FUNCTION TESTS, ABNORMAL 01/06/2010   Ovarian cyst    PANCREATITIS, ACUTE 02/18/2010   Qualifier: Diagnosis of  By: Marcelo CMA (AAMA), Dottie     PARESTHESIA 03/13/2007   Ruptured ovarian cyst    VARICOSE VEIN 01/06/2010    MEDICATIONS: Current Outpatient Medications on File Prior to Visit  Medication Sig Dispense Refill   albuterol  (VENTOLIN  HFA) 108 (90 Base) MCG/ACT inhaler TAKE 2 PUFFS BY MOUTH EVERY 6 HOURS AS NEEDED FOR WHEEZE OR SHORTNESS OF BREATH 18 each 0   Cholecalciferol (VITAMIN D) 50 MCG (2000 UT) CAPS Vitamin D (Patient not taking: Reported on 12/14/2023)     estradiol  (VIVELLE -DOT) 0.0375 MG/24HR APPLY 1 PATCH  TOPICALLY TO THE SKIN 2 TIMES A WEEK     ibuprofen (ADVIL,MOTRIN) 200 MG tablet Take 600 mg by mouth every 6 (six) hours as needed for moderate pain.     meloxicam  (MOBIC ) 15 MG tablet Take 1 tablet (15 mg total) by mouth daily. 30 tablet 3   predniSONE  (DELTASONE ) 10 MG tablet 40 mg x 3 days, 20 mg x 3 days, 10 mg x 3 days (Patient not taking: Reported on 12/14/2023) 21 tablet 0   progesterone  (PROMETRIUM ) 100 MG capsule Take 100 mg by mouth daily.     RABEprazole (ACIPHEX) 20 MG tablet Take 1 tablet (20 mg total) by mouth daily. 90 tablet 1   SUMAtriptan  (IMITREX ) 100 MG tablet TAKE 1 TABLET BY MOUTH IMMEDIATELY MAY REPEAT AFTER 2 HOURS-- 9 tablet 1   Current Facility-Administered Medications on File Prior to Visit  Medication Dose Route Frequency Provider Last Rate Last Admin   0.9 %  sodium chloride  infusion  500 mL Intravenous Once Danis, Henry L III, MD        ALLERGIES: Allergies  Allergen Reactions   Gadolinium      Code: HIVES, Desc: Multihance--pt had hives, Onset Date: [id number] IVP Dye   Penicillins     REACTION: rash   Sulfonamide Derivatives     REACTION: rash    FAMILY HISTORY: Family History  Problem Relation Age of Onset   Stroke Mother    Migraines Mother  Lung cancer Mother    Diabetes Mother    Neuropathy Father    Alcohol abuse Father    Cardiomyopathy Father    Anemia Father    Other Father        Morse. Heart Beat   Migraines Brother    Other Brother        Irr. Heart Beat   Migraines Maternal Aunt    Colon cancer Maternal Grandmother    Pancreatitis Maternal Grandfather    Esophageal cancer Neg Hx    Rectal cancer Neg Hx    Stomach cancer Neg Hx       Objective:  Blood pressure 133/82, pulse 83, height 5' 7 (1.702 m), weight 195 lb (88.5 kg), last menstrual period 07/22/2023, SpO2 98%. General: No acute distress.  Patient appears well-groomed.   Head:  Normocephalic/atraumatic Eyes:  Fundi examined but not visualized Neck: supple, no  paraspinal tenderness, full range of motion Heart:  Regular rate and rhythm Neurological Exam: alert and oriented.  Speech fluent and not dysarthric, language intact.  CN II-XII intact. Bulk and tone normal, no scapular winging, muscle strength 5/5 throughout.  Sensation to light touch intact.  Deep tendon reflexes 2+ throughout, toes downgoing.  Finger to nose testing intact.  Gait normal, Romberg negative.   Juliene Dunnings, DO  CC: Apolinar Eastern, MD

## 2024-02-23 ENCOUNTER — Ambulatory Visit (INDEPENDENT_AMBULATORY_CARE_PROVIDER_SITE_OTHER): Admitting: Neurology

## 2024-02-23 ENCOUNTER — Encounter: Payer: Self-pay | Admitting: Neurology

## 2024-02-23 VITALS — BP 133/82 | HR 83 | Ht 67.0 in | Wt 195.0 lb

## 2024-02-23 DIAGNOSIS — M25512 Pain in left shoulder: Secondary | ICD-10-CM

## 2024-02-23 DIAGNOSIS — G43009 Migraine without aura, not intractable, without status migrainosus: Secondary | ICD-10-CM

## 2024-02-23 DIAGNOSIS — G8929 Other chronic pain: Secondary | ICD-10-CM

## 2024-02-23 DIAGNOSIS — R200 Anesthesia of skin: Secondary | ICD-10-CM

## 2024-02-23 NOTE — Patient Instructions (Addendum)
 Nerve conduction study left upper extremity. ELECTROMYOGRAM AND NERVE CONDUCTION STUDIES (EMG/NCS) INSTRUCTIONS  How to Prepare The neurologist conducting the EMG will need to know if you have certain medical conditions. Tell the neurologist and other EMG lab personnel if you: Have a pacemaker or any other electrical medical device Take blood-thinning medications Have hemophilia, a blood-clotting disorder that causes prolonged bleeding Bathing Take a shower or bath shortly before your exam in order to remove oils from your skin. Don't apply lotions or creams before the exam.  What to Expect You'll likely be asked to change into a hospital gown for the procedure and lie down on an examination table. The following explanations can help you understand what will happen during the exam.  Electrodes. The neurologist or a technician places surface electrodes at various locations on your skin depending on where you're experiencing symptoms. Or the neurologist may insert needle electrodes at different sites depending on your symptoms.  Sensations. The electrodes will at times transmit a tiny electrical current that you may feel as a twinge or spasm. The needle electrode may cause discomfort or pain that usually ends shortly after the needle is removed. If you are concerned about discomfort or pain, you may want to talk to the neurologist about taking a short break during the exam.  Instructions. During the needle EMG, the neurologist will assess whether there is any spontaneous electrical activity when the muscle is at rest - activity that isn't present in healthy muscle tissue - and the degree of activity when you slightly contract the muscle.  He or she will give you instructions on resting and contracting a muscle at appropriate times. Depending on what muscles and nerves the neurologist is examining, he or she may ask you to change positions during the exam.  After your EMG You may experience some  temporary, minor bruising where the needle electrode was inserted into your muscle. This bruising should fade within several days. If it persists, contact your primary care doctor.   See Sports Medicine.  Sumatriptan  as needed.   Limit use of pain relievers to no more than 9 days out of the month to prevent risk of rebound or medication-overuse headache. Follow up one year or as needed.

## 2024-03-07 NOTE — Progress Notes (Signed)
 I, Leotis Batter, CMA acting as a scribe for Artist Lloyd, MD.  Debra [name] is a 58 y.o. female who presents to [facility] today for L shoulder pain x 5 months. No falls or injury. Pt locates pain to anterior aspect with lateral raises. Sx causing night disturbance, side sleeper. Locates pain to posterior aspect of the shoulder when lifting overhead. Some weakness in the shoulder/arm. Continues to lift weights at the gym.   Radiates: upper arm Aggravates: lifting, overhead, lateral raises Treatments tried: heat, ice  Also c/o right knee pain x 3 months. Worse with sit-to stand. Joint-line pain, having to stand up awkwardly. Was having left lower back pain a few month back, went to [employer] and not new shoes, wonders if that is contributing to knee pain.   Pertinent review of systems: No fevers or chills  Relevant historical information: Chronic back pain.  Patient works as a Teacher, early years/pre.   Exam:  BP 112/80   Pulse 72   Ht 5' 7 (1.702 m)   Wt 192 lb (87.1 kg)   SpO2 95%   BMI 30.07 kg/m  General: Well Developed, well nourished, and in no acute distress.   MSK: Left shoulder: Normal-appearing Normal motion pain with abduction.  Strength is intact pain with resisted abduction.  Positive Hawkins and Neer's test.  Negative Yergason's and speeds test.  Right knee: Normal-appearing normal motion.  Tender palpation proximal patellar tendon.  Pain with resisted knee extension.  Strength is intact.    Lab and Radiology Results  Procedure: Real-time Ultrasound Guided Injection of left shoulder subacromial bursa Device: Philips Affiniti 50G/GE Logiq Images permanently stored and available for review in PACS Verbal informed consent obtained.  Discussed risks and benefits of procedure. Warned about infection, bleeding, hyperglycemia damage to structures among others. Patient expresses understanding and agreement Time-out conducted.   Noted no  overlying erythema, induration, or other signs of local infection.   Skin prepped in a sterile fashion.   Local anesthesia: Topical Ethyl chloride.   With sterile technique and under real time ultrasound guidance: 40 mg of Kenalog and 2 mL of Marcaine injected into subacromial bursa. Fluid seen entering the bursa.   Completed without difficulty   Pain immediately resolved suggesting accurate placement of the medication.   Advised to call if fevers/chills, erythema, induration, drainage, or persistent bleeding.   Images permanently stored and available for review in the ultrasound unit.  Impression: Technically successful ultrasound guided injection.  Diagnostic Limited MSK Ultrasound of: Right knee Quad tendon is intact. Patellar tendon intact with osteophyte present inferior patellar pole with irritation and tenderness to palpation visible at the proximal patellar tendon Impression: Proximal patellar tendinitis   X-ray images left shoulder and right knee obtained today personally and independently interpreted.  Left shoulder: Minimal degenerative changes.  No acute fractures.  Right knee: Mild medial degenerative changes.  Osteophyte present inferior and superior patellar pole.  Await formal radiology review     Assessment and Plan: 58 y.o. female with left shoulder pain due to subacromial bursitis and impingement.  Plan for steroid injection subacromial bursa today and physical therapy referral.  Right knee pain due to patellar tendinitis based on ultrasound appearance.  Plan for physical therapy and diclofenac gel.  Recheck in 2 months unless better   PDMP not reviewed this encounter. Orders Placed This Encounter  Procedures   US  LIMITED JOINT SPACE STRUCTURES UP LEFT(NO LINKED CHARGES)    Reason for Exam (SYMPTOM  OR DIAGNOSIS REQUIRED):   left shoulder pain    Preferred imaging location?:   [facility] Knee AP/LAT W/[place] Right     Standing Status:   Future    Number of Occurrences:   1    Expiration Date:   04/08/2024    Reason for Exam (SYMPTOM  OR DIAGNOSIS REQUIRED):   right knee pain    Preferred imaging location?:   [place] [facility]    Is patient pregnant?:   No   DG Shoulder Left    Standing Status:   Future    Number of Occurrences:   1    Expiration Date:   04/08/2024    Reason for Exam (SYMPTOM  OR DIAGNOSIS REQUIRED):   left shoulder pain    Preferred imaging location?:   [place] [facility]    Is patient pregnant?:   No   Ambulatory referral to Physical Therapy    Referral Priority:   Routine    Referral Type:   Physical Medicine    Referral Reason:   Specialty Services Required    Requested Specialty:   Physical Therapy    Number of Visits Requested:   1   No orders of the defined types were placed in this encounter.    Discussed warning signs or symptoms. Please see discharge instructions. Patient expresses understanding.   The above documentation has been reviewed and is accurate and complete Artist Lloyd, M.D.

## 2024-03-08 ENCOUNTER — Ambulatory Visit (INDEPENDENT_AMBULATORY_CARE_PROVIDER_SITE_OTHER): Admitting: Family Medicine

## 2024-03-08 ENCOUNTER — Other Ambulatory Visit: Payer: Self-pay

## 2024-03-08 ENCOUNTER — Ambulatory Visit

## 2024-03-08 ENCOUNTER — Encounter: Payer: Self-pay | Admitting: Family Medicine

## 2024-03-08 VITALS — BP 112/80 | HR 72 | Ht 67.0 in | Wt 192.0 lb

## 2024-03-08 DIAGNOSIS — M25512 Pain in left shoulder: Secondary | ICD-10-CM

## 2024-03-08 DIAGNOSIS — M25561 Pain in right knee: Secondary | ICD-10-CM

## 2024-03-08 DIAGNOSIS — G8929 Other chronic pain: Secondary | ICD-10-CM

## 2024-03-08 NOTE — Patient Instructions (Addendum)
 Thank you for coming in today.   You received an injection today. Seek immediate medical attention if the joint becomes red, extremely painful, or is oozing fluid.   Please get an Xray today before you leave.  A referral for physical therapy has been submitted. A representative from the physical therapy office will contact you to coordinate scheduling after confirming your benefits with your insurance provider. If you do not hear from the physical therapy office within the next 1-2 weeks, please let us  know.   See you back in 2 months.

## 2024-03-11 ENCOUNTER — Ambulatory Visit: Payer: Self-pay | Admitting: Family Medicine

## 2024-03-11 NOTE — Progress Notes (Signed)
Right knee x-ray shows medium arthritis

## 2024-03-11 NOTE — Progress Notes (Signed)
Left shoulder x-ray looks okay to radiology.

## 2024-03-26 ENCOUNTER — Other Ambulatory Visit: Payer: Self-pay | Admitting: Internal Medicine

## 2024-03-26 DIAGNOSIS — G43109 Migraine with aura, not intractable, without status migrainosus: Secondary | ICD-10-CM

## 2024-04-02 ENCOUNTER — Other Ambulatory Visit

## 2024-04-02 DIAGNOSIS — Z006 Encounter for examination for normal comparison and control in clinical research program: Secondary | ICD-10-CM

## 2024-04-12 ENCOUNTER — Ambulatory Visit: Admitting: Sports Medicine

## 2024-04-12 VITALS — HR 66 | Ht 67.0 in | Wt 191.0 lb

## 2024-04-12 DIAGNOSIS — M545 Low back pain, unspecified: Secondary | ICD-10-CM | POA: Diagnosis not present

## 2024-04-12 DIAGNOSIS — G8929 Other chronic pain: Secondary | ICD-10-CM | POA: Diagnosis not present

## 2024-04-12 NOTE — Patient Instructions (Signed)
-   Start meloxicam  15 mg daily x2 weeks.  If still having pain after 2 weeks, complete 3rd-week of NSAID. May use remaining NSAID as needed once daily for pain control.  Do not to use additional over-the-counter NSAIDs (ibuprofen, naproxen, Advil, Aleve, etc.) while taking prescription NSAIDs.  May use Tylenol  [phone] mg 2 to 3 times a day for breakthrough pain.  If you experience flushing call and we can prescribe a different medication   Low back HEP   Recommend heat   As needed follow up if no improvement 2-4 week follow up

## 2024-04-12 NOTE — Progress Notes (Signed)
 [name] [address] [place] [address] Phone: [phone]   Assessment and Plan:     1. Chronic left-sided low back pain without sciatica (Primary) -Chronic with exacerbation, initial visit - Consistent with recurrent strain of left lower lumbar musculature.  Likely occurred with physical activity bending over coolers last week - Start meloxicam  15 mg daily x2 weeks.  If still having pain after 2 weeks, complete 3rd-week of NSAID. May use remaining NSAID as needed once daily for pain control.  Do not to use additional over-the-counter NSAIDs (ibuprofen, naproxen, Advil, Aleve, etc.) while taking prescription NSAIDs.  May use Tylenol  [phone] mg 2 to 3 times a day for breakthrough pain.  If patient experiences recurrent flushing, we could prescribe Celebrex course in meloxicam 's place -Start HEP for low back - May use heat topically over areas of pain  15 additional minutes spent for educating Therapeutic Home Exercise Program.  This included exercises focusing on stretching, strengthening, with focus on eccentric aspects.   Long term goals include an improvement in range of motion, strength, endurance as well as avoiding reinjury. Patient's frequency would include in 1-2 times a day, 3-5 times a week for a duration of 6-12 weeks. Proper technique shown and discussed handout in great detail with ATC.  All questions were discussed and answered.    Pertinent previous records reviewed include none   Follow Up: As needed if no improvement or worsening symptoms in 2 to 4 weeks.  Could obtain lumbar x-ray.  Could discuss physical therapy versus prednisone  back   Subjective:   I, Debra [name], am serving as a neurosurgeon for Doctor Debra [name]  Chief Complaint: back pain   HPI:   04/12/24 Patient is a 58 year old female with back pain. Patient states pain started Saturday morning. Friday she was cleaning big coolers. Ibu and  flexeril  has helped. She was flared yesterday. Low back pain that radiated down her leg. Decreased ROM. No numbness or tingling.   Relevant Historical Information: GERD  Additional pertinent review of systems negative.   Current Outpatient Medications:    Cholecalciferol (VITAMIN D) 50 MCG (2000 UT) CAPS, Vitamin D, Disp: , Rfl:    estradiol  (VIVELLE -DOT) 0.0375 MG/24HR, APPLY 1 PATCH TOPICALLY TO THE SKIN 2 TIMES A WEEK, Disp: , Rfl:    ibuprofen (ADVIL,MOTRIN) 200 MG tablet, Take 600 mg by mouth every 6 (six) hours as needed for moderate pain., Disp: , Rfl:    meloxicam  (MOBIC ) 15 MG tablet, Take 1 tablet (15 mg total) by mouth daily., Disp: 30 tablet, Rfl: 3   NON FORMULARY, Inject as directed once a week. liposlim injection, Disp: , Rfl:    NONFORMULARY OR COMPOUNDED ITEM, once a week. Lipo Slim 55 units once a week. Compound Rx from her OBGYN, Dr. Mat., Disp: , Rfl:    progesterone  (PROMETRIUM ) 100 MG capsule, Take 100 mg by mouth daily., Disp: , Rfl:    RABEprazole (ACIPHEX) 20 MG tablet, TAKE 1 TABLET BY MOUTH EVERY DAY, Disp: 90 tablet, Rfl: 1   SUMAtriptan  (IMITREX ) 100 MG tablet, TAKE 1 TABLET BY MOUTH IMMEDIATELY MAY REPEAT AFTER 2 HOURS--, Disp: 9 tablet, Rfl: 1  Current Facility-Administered Medications:    0.9 %  sodium chloride  infusion, 500 mL, Intravenous, Once, Debra Victory CROME III, MD   Objective:     Vitals:   04/12/24 0857  Pulse: 66  SpO2: 100%  Weight: 191 lb (86.6 kg)  Height: 5' 7 (  1.702 m)      Body mass index is 29.91 kg/m.    Physical Exam:    Gen: Appears well, nad, nontoxic and pleasant Psych: Alert and oriented, appropriate mood and affect Neuro: sensation intact, strength is 5/5 in upper and lower extremities, muscle tone wnl Skin: no susupicious lesions or rashes  Back - Normal skin, Spine with normal alignment and no deformity.   No tenderness to vertebral process palpation.   Paraspinous muscles are not tender and without spasm NTTP  gluteal musculature Straight leg raise negative Trendelenberg negative Piriformis Test negative Gait normal  Left lower back pain with lumbar extension and right sided rotation.  No pain with lumbar flexion or left rotation  Electronically signed by:  Debra [name] 9:20 AM 04/12/24

## 2024-04-15 LAB — GENECONNECT MOLECULAR SCREEN: Genetic Analysis Overall Interpretation: NEGATIVE

## 2024-05-03 ENCOUNTER — Ambulatory Visit: Admitting: Neurology

## 2024-05-03 DIAGNOSIS — R200 Anesthesia of skin: Secondary | ICD-10-CM

## 2024-05-03 DIAGNOSIS — M25512 Pain in left shoulder: Secondary | ICD-10-CM | POA: Diagnosis not present

## 2024-05-03 DIAGNOSIS — G8929 Other chronic pain: Secondary | ICD-10-CM | POA: Insufficient documentation

## 2024-05-03 NOTE — Procedures (Signed)
 [facility]  [address], [address]  [place], [place] [address] Tel: [phone] Fax: [phone] Test Date:  05/03/2024  Patient: Debra [name] DOB: Feb 13, 1966 Physician: Tonita Blanch, DO  Sex: Female Height: 5' 7 Ref Phys: Juliene Dunnings, DO  ID#: [id number]   Technician:    History: This is a 58 year old female referred for evaluation of left shoulder pain and arm numbness.  NCV & EMG Findings: Extensive electrodiagnostic testing of the left upper extremity shows:  Left mixed palmar sensory response shows mildly prolonged latency.  Left median, ulnar, medial antebrachial cutaneous, and lateral antebrachial cutaneous sensory responses are within normal limits. Left median and ulnar motor responses are within normal limits. There is no evidence of active or chronic motor axonal loss changes affecting any of the tested muscles.  Motor unit configuration and recruitment pattern is within normal limits.  Impression: Left median neuropathy at or distal to the wrist, consistent with a clinical diagnosis of carpal tunnel syndrome.  Overall, these findings are very mild in degree electrically. There is no evidence of a cervical radiculopathy, brachial plexopathy, or ulnar neuropathy affecting the left upper extremity.   ___________________________ Tonita Blanch, DO    Nerve Conduction Studies   Stim Site NR Peak (ms) Norm Peak (ms) O-P Amp (V) Norm O-P Amp  Left Lat Ante Brach Cutan Anti Sensory (Lat Forearm)  32 C  Lat Biceps    1.3 <2.9 19.3 >12  Left Med Ante Brach Cutan Anti Sensory (Med Forearm)  32 C  Elbow    2.0  9.7 >8  Left Median Anti Sensory (2nd Digit)  32 C  Wrist    3.4 <3.6 33.7 >15  Left Ulnar Anti Sensory (5th Digit)  32 C  Wrist    2.6 <3.1 40.6 >10     Stim Site NR Onset (ms) Norm Onset (ms) O-P Amp (mV) Norm O-P Amp Site1 Site2 Delta-0 (ms) Dist (cm) Vel (m/s) Norm Vel (m/s)  Left Median Motor (Abd Poll Brev)  32 C  Wrist    3.2 <4.0  9.4 >6 Elbow Wrist 5.5 31.0 56 >50  Elbow    8.7  9.2         Left Ulnar Motor (Abd Dig Minimi)  32 C  Wrist    2.3 <3.1 8.9 >7 B Elbow Wrist 3.8 23.0 61 >50  B Elbow    6.1  8.0  A Elbow B Elbow 1.8 10.0 56 >50  A Elbow    7.9  7.5            Stim Site NR Peak (ms) Norm Peak (ms) P-T Amp (V) Site1 Site2 Delta-P (ms) Norm Delta (ms)  Left Median/Ulnar Palm Comparison (Wrist - 8cm)  32 C  Median Palm    1.9 <2.2 101.6 Median Palm Ulnar Palm *0.5   Ulnar Palm    1.4 <2.2 23.3       Electromyography   Side Muscle Ins.Act Fibs Fasc Recrt Amp Dur Poly Activation Comment  Left 1stDorInt Nml Nml Nml Nml Nml Nml Nml Nml N/A  Left Abd Poll Brev Nml Nml Nml Nml Nml Nml Nml Nml N/A  Left PronatorTeres Nml Nml Nml Nml Nml Nml Nml Nml N/A  Left Biceps Nml Nml Nml Nml Nml Nml Nml Nml N/A  Left Triceps Nml Nml Nml Nml Nml Nml Nml Nml N/A  Left Deltoid Nml Nml Nml Nml Nml Nml Nml Nml N/A  Left Infraspinatus Nml Nml Nml Nml Nml Nml Nml Nml  N/A      Waveforms:

## 2024-05-06 ENCOUNTER — Ambulatory Visit: Payer: Self-pay | Admitting: Neurology

## 2024-05-07 NOTE — Progress Notes (Signed)
 Patient advised.

## 2024-05-23 ENCOUNTER — Other Ambulatory Visit: Payer: Self-pay | Admitting: Internal Medicine

## 2024-05-23 DIAGNOSIS — G43109 Migraine with aura, not intractable, without status migrainosus: Secondary | ICD-10-CM

## 2024-05-24 ENCOUNTER — Ambulatory Visit: Admitting: Family Medicine

## 2024-06-09 ENCOUNTER — Other Ambulatory Visit: Payer: Self-pay

## 2024-06-09 ENCOUNTER — Ambulatory Visit (HOSPITAL_COMMUNITY): Admission: EM | Admit: 2024-06-09 | Discharge: 2024-06-09 | Disposition: A | Discharge status: Home / Self Care

## 2024-06-09 ENCOUNTER — Encounter (HOSPITAL_COMMUNITY): Payer: Self-pay

## 2024-06-09 DIAGNOSIS — R197 Diarrhea, unspecified: Secondary | ICD-10-CM | POA: Diagnosis not present

## 2024-06-09 MED ORDER — ONDANSETRON 4 MG PO TBDP: 4.0000 mg | ORAL_TABLET | Freq: Three times a day (TID) | ORAL | 0 refills | Status: AC | PRN

## 2024-06-09 NOTE — Discharge Instructions (Addendum)
" °  1. Intractable diarrhea (Primary) - Gastrointestinal Panel by PCR , Stool; Future - Stool culture; Future - ondansetron  (ZOFRAN -ODT) 4 MG disintegrating tablet; Take 1 tablet (4 mg total) by mouth every 8 (eight) hours as needed for nausea or vomiting.  Dispense: 20 tablet; Refill: 0 - Ambulatory referral to Gastroenterology for follow-up evaluation if symptoms do not resolve with current therapy and stool studies are negative.  -Continue to monitor symptoms for any change in severity if there is any escalation of current symptoms or development of new symptoms follow-up in ER for further evaluation and management. "

## 2024-06-09 NOTE — ED Triage Notes (Signed)
 Pt c/o lower abdominal cramping, liquid diarrhea, and nausea x 5 days. Patient able to tolerate bland diet. Pt reports 10 + liquid stools daily. No OTC medications or anti-diarrheals. Denies any blood in stool, fevers, or vomiting. Denies any known sick exposures, no recent abx use. Patient works in [employer] as a teacher, early years/pre.

## 2024-06-09 NOTE — ED Provider Notes (Signed)
 " [facility]  Note:  This document was prepared using Dragon voice recognition software and may include unintentional dictation errors.  MRN: [id number] DOB: Feb 03, 1966  Subjective:   Debra [name] is a 59 y.o. female presenting for nausea, lower abdominal cramping with liquid diarrhea x 4 to 5 days.  Patient reports that symptoms began on Wednesday or Thursday.  She has been eating a bland diet and able to keep food and fluids down.  Patient reports that she is having 10+ liquid stools daily.  Patient states that yesterday symptoms decreased slightly and she only had 8 bouts of diarrhea.  Denies any vomiting, abdominal pain, blood in stool, fever, recent known sick exposures.  Patient denies taking any antibiotics recently.  Current Medications[1]   Allergies[2]  Past Medical History:  Diagnosis Date   Chronic headaches    ESOPHAGITIS, HX OF 02/18/2010   EXTERNAL HEMORRHOIDS 02/18/2010   Gallstones    GERD 03/13/2007   LIVER FUNCTION TESTS, ABNORMAL 01/06/2010   Ovarian cyst    PANCREATITIS, ACUTE 02/18/2010   Qualifier: Diagnosis of  By: Marcelo CMA (AAMA), Dottie     PARESTHESIA 03/13/2007   Ruptured ovarian cyst    VARICOSE VEIN 01/06/2010     Past Surgical History:  Procedure Laterality Date   CHOLECYSTECTOMY     ENDOVENOUS ABLATION SAPHENOUS VEIN W/ LASER Left 07/02/2020   endovenous laser ablation left greater saphenous vein and stab phlebectomy 10-20 incisions left leg by Medford Blade MD    TONSILLECTOMY     Uterine Polyps      Family History  Problem Relation Age of Onset   Stroke Mother    Migraines Mother    Lung cancer Mother    Diabetes Mother    Neuropathy Father    Alcohol abuse Father    Cardiomyopathy Father    Anemia Father    Other Father        Morse. Heart Beat   Migraines Brother    Other Brother        Irr. Heart Beat   Migraines Maternal Aunt    Colon cancer Maternal Grandmother    Pancreatitis Maternal  Grandfather    Esophageal cancer Neg Hx    Rectal cancer Neg Hx    Stomach cancer Neg Hx     Social History[3]  ROS Refer to HPI for ROS details.  Objective:    Vitals: BP 119/78 (BP Location: Right Arm)   Pulse 60   Temp 98.4 F (36.9 C) (Oral)   Resp 16   SpO2 98%   Physical Exam Vitals and nursing note reviewed.  Constitutional:      General: She is not in acute distress.    Appearance: She is well-developed. She is not ill-appearing or toxic-appearing.  HENT:     Head: Normocephalic and atraumatic.  Cardiovascular:     Rate and Rhythm: Normal rate.  Pulmonary:     Effort: Pulmonary effort is normal. No respiratory distress.     Breath sounds: No stridor. No wheezing.  Abdominal:     General: There is no distension.     Tenderness: There is no abdominal tenderness. There is no right CVA tenderness or left CVA tenderness.  Skin:    General: Skin is warm and dry.  Neurological:     General: No focal deficit present.     Mental Status: She is alert and oriented to person, place, and time.  Psychiatric:        Mood and  Affect: Mood normal.        Behavior: Behavior normal.     Procedures  No results found for this or any previous visit (from the past 24 hours).  Assessment and Plan :     Discharge Instructions       1. Intractable diarrhea (Primary) - Gastrointestinal Panel by PCR , Stool; Future - Stool culture; Future - ondansetron  (ZOFRAN -ODT) 4 MG disintegrating tablet; Take 1 tablet (4 mg total) by mouth every 8 (eight) hours as needed for nausea or vomiting.  Dispense: 20 tablet; Refill: 0 - Ambulatory referral to Gastroenterology for follow-up evaluation if symptoms do not resolve with current therapy and stool studies are negative.  -Continue to monitor symptoms for any change in severity if there is any escalation of current symptoms or development of new symptoms follow-up in ER for further evaluation and management.      Elane Peabody B  Samreet Edenfield    [1]  Current Facility-Administered Medications:    0.9 %  sodium chloride  infusion, 500 mL, Intravenous, Once, Danis, Victory CROME III, MD  Current Outpatient Medications:    Cholecalciferol (VITAMIN D) 50 MCG (2000 UT) CAPS, Vitamin D, Disp: , Rfl:    estradiol  (VIVELLE -DOT) 0.0375 MG/24HR, APPLY 1 PATCH TOPICALLY TO THE SKIN 2 TIMES A WEEK, Disp: , Rfl:    ibuprofen (ADVIL,MOTRIN) 200 MG tablet, Take 600 mg by mouth every 6 (six) hours as needed for moderate pain., Disp: , Rfl:    NON FORMULARY, Inject as directed once a week. liposlim injection, Disp: , Rfl:    NONFORMULARY OR COMPOUNDED ITEM, once a week. Lipo Slim 55 units once a week. Compound Rx from her OBGYN, Dr. Mat., Disp: , Rfl:    ondansetron  (ZOFRAN -ODT) 4 MG disintegrating tablet, Take 1 tablet (4 mg total) by mouth every 8 (eight) hours as needed for nausea or vomiting., Disp: 20 tablet, Rfl: 0   progesterone  (PROMETRIUM ) 100 MG capsule, Take 100 mg by mouth daily., Disp: , Rfl:    RABEprazole (ACIPHEX) 20 MG tablet, TAKE 1 TABLET BY MOUTH EVERY DAY, Disp: 90 tablet, Rfl: 1   SUMAtriptan  (IMITREX ) 100 MG tablet, TAKE 1 TABLET BY MOUTH IMMEDIATELY MAY REPEAT AFTER 2 HOURS--, Disp: 9 tablet, Rfl: 1   meloxicam  (MOBIC ) 15 MG tablet, Take 1 tablet (15 mg total) by mouth daily., Disp: 30 tablet, Rfl: 3 [2]  Allergies Allergen Reactions   Gadolinium      Code: HIVES, Desc: Multihance--pt had hives, Onset Date: [id number] IVP Dye   Penicillins     REACTION: rash   Sulfonamide Derivatives     REACTION: rash  [3]  Social History Tobacco Use   Smoking status: Never   Smokeless tobacco: Never  Vaping Use   Vaping status: Never Used  Substance Use Topics   Alcohol use: Yes    Comment: rarely   Drug use: Never     Aurea Goodell B, NP 06/09/24 1420  "

## 2025-03-07 ENCOUNTER — Ambulatory Visit: Admitting: Neurology
